# Patient Record
Sex: Female | Born: 1980 | Race: White | Hispanic: No | Marital: Married | Smoking: Former smoker
Health system: Southern US, Community
[De-identification: ages and names within clinical notes are randomized; demographics above are authoritative.]

## PROBLEM LIST (undated history)

## (undated) DIAGNOSIS — Z8041 Family history of malignant neoplasm of ovary: Secondary | ICD-10-CM

## (undated) DIAGNOSIS — R000 Tachycardia, unspecified: Secondary | ICD-10-CM

## (undated) DIAGNOSIS — N949 Unspecified condition associated with female genital organs and menstrual cycle: Secondary | ICD-10-CM

## (undated) DIAGNOSIS — I499 Cardiac arrhythmia, unspecified: Secondary | ICD-10-CM

## (undated) DIAGNOSIS — N83209 Unspecified ovarian cyst, unspecified side: Secondary | ICD-10-CM

## (undated) DIAGNOSIS — Z803 Family history of malignant neoplasm of breast: Secondary | ICD-10-CM

## (undated) DIAGNOSIS — I272 Pulmonary hypertension, unspecified: Secondary | ICD-10-CM

## (undated) DIAGNOSIS — D509 Iron deficiency anemia, unspecified: Secondary | ICD-10-CM

## (undated) DIAGNOSIS — K219 Gastro-esophageal reflux disease without esophagitis: Secondary | ICD-10-CM

## (undated) DIAGNOSIS — R0600 Dyspnea, unspecified: Secondary | ICD-10-CM

## (undated) DIAGNOSIS — I509 Heart failure, unspecified: Secondary | ICD-10-CM

## (undated) DIAGNOSIS — Z87891 Personal history of nicotine dependence: Secondary | ICD-10-CM

## (undated) DIAGNOSIS — Z9981 Dependence on supplemental oxygen: Secondary | ICD-10-CM

## (undated) DIAGNOSIS — F32A Depression, unspecified: Secondary | ICD-10-CM

## (undated) DIAGNOSIS — D259 Leiomyoma of uterus, unspecified: Secondary | ICD-10-CM

## (undated) DIAGNOSIS — F329 Major depressive disorder, single episode, unspecified: Secondary | ICD-10-CM

## (undated) DIAGNOSIS — R001 Bradycardia, unspecified: Secondary | ICD-10-CM

## (undated) DIAGNOSIS — Z950 Presence of cardiac pacemaker: Secondary | ICD-10-CM

## (undated) DIAGNOSIS — N281 Cyst of kidney, acquired: Secondary | ICD-10-CM

## (undated) DIAGNOSIS — M1990 Unspecified osteoarthritis, unspecified site: Secondary | ICD-10-CM

## (undated) DIAGNOSIS — I10 Essential (primary) hypertension: Secondary | ICD-10-CM

## (undated) DIAGNOSIS — Z87442 Personal history of urinary calculi: Secondary | ICD-10-CM

## (undated) DIAGNOSIS — R519 Headache, unspecified: Secondary | ICD-10-CM

## (undated) DIAGNOSIS — F419 Anxiety disorder, unspecified: Secondary | ICD-10-CM

## (undated) HISTORY — DX: Bradycardia, unspecified: R00.1

## (undated) HISTORY — PX: CYSTOSCOPY: SUR368

## (undated) HISTORY — PX: INSERT / REPLACE / REMOVE PACEMAKER: SUR710

## (undated) HISTORY — PX: STENT REMOVAL: SHX6421

## (undated) HISTORY — DX: Family history of malignant neoplasm of ovary: Z80.41

## (undated) HISTORY — DX: Family history of malignant neoplasm of breast: Z80.3

## (undated) HISTORY — PX: GASTRIC BYPASS: SHX52

## (undated) HISTORY — DX: Tachycardia, unspecified: R00.0

## (undated) HISTORY — PX: ABDOMINAL HYSTERECTOMY: SHX81

## (undated) HISTORY — DX: Anxiety disorder, unspecified: F41.9

## (undated) HISTORY — PX: OTHER SURGICAL HISTORY: SHX169

---

## 2010-10-01 HISTORY — PX: LITHOTRIPSY: SUR834

## 2011-10-02 HISTORY — PX: PACEMAKER INSERTION: SHX728

## 2011-10-10 HISTORY — PX: TUBAL LIGATION: SHX77

## 2018-08-18 ENCOUNTER — Ambulatory Visit
Admission: EM | Admit: 2018-08-18 | Discharge: 2018-08-18 | Disposition: A | Discharge status: Home / Self Care | Payer: Medicare Other | Attending: Family Medicine | Admitting: Family Medicine

## 2018-08-18 ENCOUNTER — Other Ambulatory Visit: Payer: Self-pay

## 2018-08-18 DIAGNOSIS — M5432 Sciatica, left side: Secondary | ICD-10-CM | POA: Diagnosis not present

## 2018-08-18 MED ORDER — PREDNISONE 20 MG PO TABS: ORAL_TABLET | ORAL | 0 refills | Status: DC

## 2018-08-18 MED ORDER — TRAMADOL HCL 50 MG PO TABS: ORAL_TABLET | ORAL | 0 refills | Status: DC

## 2018-08-18 NOTE — ED Provider Notes (Signed)
[facility]    CSN: [id number] Arrival date & time: 08/18/18  1345     History   Chief Complaint Chief Complaint  Patient presents with  . Back Pain    HPI Rachel Wall is a 37 y.o. female.   37 yo female with a c/o low back pain radiating down into the buttock and back of her leg and across the back for the past week. Denies any fall/injuries, fevers, chills, rash.   The history is provided by the patient.    History reviewed. No pertinent past medical history.  There are no active problems to display for this patient.   Past Surgical History:  Procedure Laterality Date  . LITHOTRIPSY  2012  . PACEMAKER INSERTION  2013  . STENT REMOVAL    . TUBAL LIGATION  10/10/2011    OB History   None      Home Medications    Prior to Admission medications   Medication Sig Start Date End Date Taking? Authorizing Provider  ambrisentan (LETAIRIS) 10 MG tablet Take by mouth daily.   Yes [provider]  citalopram (CELEXA) 40 MG tablet Take 40 mg by mouth daily.   Yes [provider]  diltiazem (CARDIZEM) 120 MG tablet Take 120 mg by mouth 2 (two) times daily.   Yes [provider]  ranitidine (ZANTAC) 150 MG capsule Take 150 mg by mouth 2 (two) times daily.   Yes [provider]  Selexipag (UPTRAVI) 1200 MCG TABS Take by mouth.   Yes [provider]  tadalafil (ADCIRCA/CIALIS) 20 MG tablet Take 20 mg by mouth daily as needed.   Yes [provider]  predniSONE (DELTASONE) 20 MG tablet 3 tabs po once day 1, then 2 tabs po qd x 2 days, then 1 tab po qd x 2 days, then half a tab po qd x 2 days 08/18/18   Norval Gable, MD  traMADol Veatrice Bourbon) 50 MG tablet 1-2 tabs po q 8 hours prn 08/18/18   Norval Gable, MD    Family History Family History  Problem Relation Age of Onset  . Lung disease Father   . Heart disease Father   . Diabetes Father   . Hypertension Father   . Kidney cancer Father      Social History Social History   Tobacco Use  . Smoking status: Current Every Day Smoker    Packs/day: 1.00    Types: Cigarettes  . Smokeless tobacco: Never Used  Substance Use Topics  . Alcohol use: Not Currently  . Drug use: Not Currently     Allergies   Patient has no known allergies.   Review of Systems Review of Systems   Physical Exam Triage Vital Signs ED Triage Vitals  Enc Vitals Group     BP 08/18/18 1403 (!) 108/56     Pulse Rate 08/18/18 1403 77     Resp 08/18/18 1403 18     Temp 08/18/18 1403 98.8 F (37.1 C)     Temp Source 08/18/18 1403 Oral     SpO2 08/18/18 1403 98 %     Weight 08/18/18 1356 190 lb (86.2 kg)     Height 08/18/18 1356 5\' 9"  (1.753 m)     Head Circumference --      Peak Flow --      Pain Score 08/18/18 1356 10     Pain Loc --      Pain Edu? --      Excl. in  GC? --    No data found.  Updated Vital Signs BP (!) 108/56 (BP Location: Left Arm)   Pulse 77   Temp 98.8 F (37.1 C) (Oral)   Resp 18   Ht 5\' 9"  (1.753 m)   Wt 86.2 kg   LMP 08/17/2018   SpO2 98%   BMI 28.06 kg/m   Visual Acuity Right Eye Distance:   Left Eye Distance:   Bilateral Distance:    Right Eye Near:   Left Eye Near:    Bilateral Near:     Physical Exam  Constitutional: She appears well-developed and well-nourished. No distress.  Musculoskeletal:       Lumbar back: She exhibits tenderness (over the left buttock and paraspinous muscles) and spasm. She exhibits normal range of motion, no bony tenderness, no swelling, no edema, no deformity, no laceration, no pain and normal pulse.  Skin: She is not diaphoretic.  Nursing note and vitals reviewed.    UC Treatments / Results  Labs (all labs ordered are listed, but only abnormal results are displayed) Labs Reviewed - No data to display  EKG None  Radiology No results found.  Procedures Procedures (including critical care time)  Medications Ordered in UC Medications - No data to  display  Initial Impression / Assessment and Plan / UC Course  I have reviewed the triage vital signs and the nursing notes.  Pertinent labs & imaging results that were available during my care of the patient were reviewed by me and considered in my medical decision making (see chart for details).      Final Clinical Impressions(s) / UC Diagnoses   Final diagnoses:  Sciatica of left side     Discharge Instructions     Heat/ice to area Tylenol as needed    ED Prescriptions    Medication Sig Dispense Auth. Provider   predniSONE (DELTASONE) 20 MG tablet 3 tabs po once day 1, then 2 tabs po qd x 2 days, then 1 tab po qd x 2 days, then half a tab po qd x 2 days 10 tablet Rachel Wall, Linward Foster, MD   traMADol (ULTRAM) 50 MG tablet 1-2 tabs po q 8 hours prn 15 tablet Rachel Wall, Linward Foster, MD      1. diagnosis reviewed with patient 2. rx as per orders above; reviewed possible side effects, interactions, risks and benefits  3. Recommend supportive treatment as above  4. Follow-up prn if symptoms worsen or don't improve  Controlled Substance Prescriptions [place] Controlled Substance Registry consulted? Not Applicable   Norval Gable, MD 08/18/18 [phone]

## 2018-08-18 NOTE — ED Triage Notes (Signed)
Patient complains of low back pain that radiates down her left leg and across back x 1 week.

## 2018-08-18 NOTE — Discharge Instructions (Signed)
Heat/ice to area Tylenol as needed

## 2018-10-06 ENCOUNTER — Ambulatory Visit
Admission: EM | Admit: 2018-10-06 | Discharge: 2018-10-06 | Disposition: A | Discharge status: Home / Self Care | Payer: Medicaid Other | Attending: Family Medicine | Admitting: Family Medicine

## 2018-10-06 ENCOUNTER — Encounter: Payer: Self-pay | Admitting: Emergency Medicine

## 2018-10-06 ENCOUNTER — Other Ambulatory Visit: Payer: Self-pay

## 2018-10-06 DIAGNOSIS — J019 Acute sinusitis, unspecified: Secondary | ICD-10-CM | POA: Diagnosis not present

## 2018-10-06 DIAGNOSIS — H66003 Acute suppurative otitis media without spontaneous rupture of ear drum, bilateral: Secondary | ICD-10-CM | POA: Insufficient documentation

## 2018-10-06 DIAGNOSIS — F17210 Nicotine dependence, cigarettes, uncomplicated: Secondary | ICD-10-CM

## 2018-10-06 HISTORY — DX: Depression, unspecified: F32.A

## 2018-10-06 HISTORY — DX: Major depressive disorder, single episode, unspecified: F32.9

## 2018-10-06 HISTORY — DX: Pulmonary hypertension, unspecified: I27.20

## 2018-10-06 HISTORY — DX: Gastro-esophageal reflux disease without esophagitis: K21.9

## 2018-10-06 MED ORDER — AMOXICILLIN-POT CLAVULANATE 875-125 MG PO TABS: 1.0000 | ORAL_TABLET | Freq: Two times a day (BID) | ORAL | 0 refills | Status: DC

## 2018-10-06 MED ORDER — SALINE SPRAY 0.65 % NA SOLN: 1.0000 | NASAL | 0 refills | Status: DC | PRN

## 2018-10-06 MED ORDER — GUAIFENESIN-DM 100-10 MG/5ML PO SYRP: 5.0000 mL | ORAL_SOLUTION | ORAL | 0 refills | Status: DC | PRN

## 2018-10-06 NOTE — ED Triage Notes (Signed)
Patient in today c/o productive cough (green), sinus congestion and bilateral ear fullness and body aches. Patient denies fever. Patient has tried OTC Nyquil/Dayquil, Sudafed PE.

## 2018-10-06 NOTE — Discharge Instructions (Addendum)
-  Augmentin: 1 tablet every 12 hours for 7 days -Robitussin-DM: Every 5 hours as needed for cough -Can use Ocean spray/nasal saline/Nettie pot to help flush out sinus mucus and help clear drainage -Over-the-counter ibuprofen or Tylenol as needed for pain and fever -Fluids -Follow-up as

## 2018-10-06 NOTE — ED Provider Notes (Signed)
[facility]    CSN: [id number] Arrival date & time: 10/06/18  1817     History   Chief Complaint Chief Complaint  Patient presents with  . Cough  . Nasal Congestion    HPI Rachel Wall is a 38 y.o. female.   Patient is a 38 year old female with past medical history of depression, GERD, and pulmonary hypertension reports a 61-month history of chest and sinus issues along with body aches, stuffiness rhinorrhea.  Patient also reports her eyes been puffy and she has bilateral ear pain that started yesterday.  Patient reports history of pulmonary hypertension.  Patient states again this is been ongoing for 2 months but states she could not take anymore the ear pain was making things worse.  She has tried NyQuil, Sudafed, DayQuil over-the-counter for symptoms without much relief.  Patient was seen a few months ago here at this clinic for sciatica and given steroid taper.  Patient does not complain of any sciatic symptoms at this point.     Past Medical History:  Diagnosis Date  . Depression   . GERD (gastroesophageal reflux disease)   . Pulmonary hypertension ([place])     There are no active problems to display for this patient.   Past Surgical History:  Procedure Laterality Date  . LITHOTRIPSY  2012  . PACEMAKER INSERTION  2013  . STENT REMOVAL    . TUBAL LIGATION  10/10/2011    OB History   No obstetric history on file.      Home Medications    Prior to Admission medications   Medication Sig Start Date End Date Taking? Authorizing Provider  ambrisentan (LETAIRIS) 10 MG tablet Take by mouth daily.   Yes [provider]  citalopram (CELEXA) 40 MG tablet Take 40 mg by mouth daily.   Yes [provider]  diltiazem (CARDIZEM) 120 MG tablet Take 120 mg by mouth 2 (two) times daily.   Yes [provider]  ranitidine (ZANTAC) 150 MG capsule Take 150 mg by mouth 2 (two) times daily.   Yes [provider]  Selexipag  (UPTRAVI) 1200 MCG TABS Take by mouth.   Yes [provider]  tadalafil (ADCIRCA/CIALIS) 20 MG tablet Take 20 mg by mouth daily as needed.   Yes [provider]  amoxicillin-clavulanate (AUGMENTIN) 875-125 MG tablet Take 1 tablet by mouth every 12 (twelve) hours. 10/06/18   Luvenia Redden, PA-C  guaiFENesin-dextromethorphan (ROBITUSSIN DM) 100-10 MG/5ML syrup Take 5 mLs by mouth every 4 (four) hours as needed for cough. 10/06/18   Luvenia Redden, PA-C  sodium chloride (OCEAN) 0.65 % SOLN nasal spray Place 1 spray into both nostrils as needed for congestion. 10/06/18   Luvenia Redden, PA-C    Family History Family History  Problem Relation Age of Onset  . Healthy Mother   . Lung disease Father   . Heart disease Father   . Diabetes Father   . Hypertension Father   . Kidney cancer Father     Social History Social History   Tobacco Use  . Smoking status: Current Every Day Smoker    Packs/day: 1.00    Types: Cigarettes  . Smokeless tobacco: Never Used  Substance Use Topics  . Alcohol use: Not Currently  . Drug use: Not Currently     Allergies   Other   Review of Systems Review of Systems as noted above in HPI.  Other systems reviewed and found to be negative.   Physical Exam  Triage Vital Signs ED Triage Vitals  Enc Vitals Group     BP 10/06/18 1833 133/74     Pulse Rate 10/06/18 1833 81     Resp 10/06/18 1833 (!) 24     Temp 10/06/18 1833 98.1 F (36.7 C)     Temp Source 10/06/18 1833 Oral     SpO2 10/06/18 1833 98 %     Weight 10/06/18 1835 190 lb (86.2 kg)     Height 10/06/18 1835 5\' 8"  (1.727 m)     Head Circumference --      Peak Flow --      Pain Score 10/06/18 1834 8     Pain Loc --      Pain Edu? --      Excl. in [place]? --    No data found.  Updated Vital Signs BP 133/74 (BP Location: Left Arm)   Pulse 81   Temp 98.1 F (36.7 C) (Oral)   Resp (!) 24   Ht 5\' 8"  (1.727 m)   Wt 190 lb (86.2 kg)   LMP 10/03/2018 (Exact Date)    SpO2 98%   BMI 28.89 kg/m    Physical Exam Constitutional:      Appearance: She is ill-appearing.     Comments: Appears older than stated age  HENT:     Right Ear: A middle ear effusion is present.     Left Ear: A middle ear effusion is present.     Ears:     Comments: Both ears tender to touch.    Nose:     Right Sinus: Maxillary sinus tenderness and frontal sinus tenderness present.     Left Sinus: Maxillary sinus tenderness and frontal sinus tenderness present.  Eyes:     Extraocular Movements: Extraocular movements intact.     Pupils: Pupils are equal, round, and reactive to light.  Neck:     Musculoskeletal: Normal range of motion and neck supple.  Cardiovascular:     Rate and Rhythm: Normal rate and regular rhythm.  Pulmonary:     Effort: Pulmonary effort is normal.     Breath sounds: Normal breath sounds. No wheezing, rhonchi or rales.     Comments: Frequent cough Lymphadenopathy:     Cervical: No cervical adenopathy.  Skin:    General: Skin is warm and dry.     Capillary Refill: Capillary refill takes less than 2 seconds.  Neurological:     General: No focal deficit present.     Mental Status: She is alert and oriented to person, place, and time.     Cranial Nerves: No cranial nerve deficit.  Psychiatric:        Mood and Affect: Mood normal.        Behavior: Behavior normal.      UC Treatments / Results  Labs (all labs ordered are listed, but only abnormal results are displayed) Labs Reviewed - No data to display  EKG None  Radiology No results found.  Procedures Procedures (including critical care time)  Medications Ordered in UC Medications - No data to display  Initial Impression / Assessment and Plan / UC Course  I have reviewed the triage vital signs and the nursing notes.  Pertinent labs & imaging results that were available during my care of the patient were reviewed by me and considered in my medical decision making (see chart for  details).    Patient with sinus tenderness as well as ear pain with fluid both ears with  ongoing symptoms of upper respiratory issues for about 2 months.  Worsening the last couple days.  Patient given prescription for Augmentin to cover the sinus ears, and chest.  Also give prescription Robitussin-DM for her cough.  Advised can use saline spray or Nettie pot to help open up the sinuses.  Recommend ibuprofen or Tylenol for pain.  Also recommend pushing fluids.  Instructions as below.  Final Clinical Impressions(s) / UC Diagnoses   Final diagnoses:  Acute sinusitis, recurrence not specified, unspecified location  Acute suppurative otitis media of both ears without spontaneous rupture of tympanic membranes, recurrence not specified     Discharge Instructions     -Augmentin: 1 tablet every 12 hours for 7 days -Robitussin-DM: Every 5 hours as needed for cough -Can use Ocean spray/nasal saline/Nettie pot to help flush out sinus mucus and help clear drainage -Over-the-counter ibuprofen or Tylenol as needed for pain and fever -Fluids -Follow-up as    ED Prescriptions    Medication Sig Dispense Auth. Provider   amoxicillin-clavulanate (AUGMENTIN) 875-125 MG tablet Take 1 tablet by mouth every 12 (twelve) hours. 14 tablet Luvenia Redden, PA-C   sodium chloride (OCEAN) 0.65 % SOLN nasal spray Place 1 spray into both nostrils as needed for congestion. 88 mL Luvenia Redden, PA-C   guaiFENesin-dextromethorphan (ROBITUSSIN DM) 100-10 MG/5ML syrup Take 5 mLs by mouth every 4 (four) hours as needed for cough. 118 mL Luvenia Redden, PA-C     Controlled Substance Prescriptions [place] Controlled Substance Registry consulted? Not Applicable   Luvenia Redden, PA-C 10/06/18 2105

## 2018-11-20 ENCOUNTER — Encounter: Payer: Self-pay | Admitting: Obstetrics and Gynecology

## 2018-11-20 ENCOUNTER — Ambulatory Visit (INDEPENDENT_AMBULATORY_CARE_PROVIDER_SITE_OTHER): Payer: Medicare Other | Admitting: Obstetrics and Gynecology

## 2018-11-20 ENCOUNTER — Other Ambulatory Visit (HOSPITAL_COMMUNITY)
Admission: RE | Admit: 2018-11-20 | Discharge: 2018-11-20 | Disposition: A | Discharge status: Home / Self Care | Payer: Medicare (Managed Care) | Source: Ambulatory Visit | Attending: Obstetrics and Gynecology | Admitting: Obstetrics and Gynecology

## 2018-11-20 VITALS — BP 120/72 | HR 87 | Ht 68.0 in | Wt 202.0 lb

## 2018-11-20 DIAGNOSIS — Z30432 Encounter for removal of intrauterine contraceptive device: Secondary | ICD-10-CM | POA: Diagnosis not present

## 2018-11-20 DIAGNOSIS — N921 Excessive and frequent menstruation with irregular cycle: Secondary | ICD-10-CM | POA: Diagnosis not present

## 2018-11-20 DIAGNOSIS — N760 Acute vaginitis: Secondary | ICD-10-CM | POA: Diagnosis not present

## 2018-11-20 DIAGNOSIS — Z1151 Encounter for screening for human papillomavirus (HPV): Secondary | ICD-10-CM

## 2018-11-20 DIAGNOSIS — Z720 Tobacco use: Secondary | ICD-10-CM

## 2018-11-20 DIAGNOSIS — Z124 Encounter for screening for malignant neoplasm of cervix: Secondary | ICD-10-CM | POA: Diagnosis not present

## 2018-11-20 DIAGNOSIS — R102 Pelvic and perineal pain unspecified side: Secondary | ICD-10-CM

## 2018-11-20 DIAGNOSIS — B9689 Other specified bacterial agents as the cause of diseases classified elsewhere: Secondary | ICD-10-CM

## 2018-11-20 LAB — POCT WET PREP WITH KOH
Clue Cells Wet Prep HPF POC: POSITIVE
KOH Prep POC: POSITIVE — AB
Trichomonas, UA: NEGATIVE
Yeast Wet Prep HPF POC: NEGATIVE

## 2018-11-20 LAB — HM PAP SMEAR: HM Pap smear: NORMAL

## 2018-11-20 LAB — POCT HEMOGLOBIN: Hemoglobin: 15.5 g/dL — AB (ref 11–14.6)

## 2018-11-20 MED ORDER — METRONIDAZOLE 500 MG PO TABS: 500.0000 mg | ORAL_TABLET | Freq: Two times a day (BID) | ORAL | 0 refills | Status: AC

## 2018-11-20 NOTE — Progress Notes (Signed)
Patient, No Pcp Per   Chief Complaint  Patient presents with  . Metrorrhagia    has mirena for almost 7 yrs, has been having on/off bleeding, has really bad cramping    HPI:      Ms. Rachel Wall is a 38 y.o. No obstetric history on file. who LMP was No LMP recorded. (Menstrual status: IUD)., presents today for NP eval of bleeding/cramping for the past yr. Pt has had light to heavy bleeding most days for the past yr. Also has severe cramping pains, no meds taken. Pt had Mirena placed 7 yrs ago and due to be removed. Moved her from [facility]. Pt states they wouldn't take IUD out and wanted her to see speicalist, but she couldn't afford it.  Pt with hx of menorrhagia after TL after last pregnancy. IUD placed for bleeding but didn't work. Pt still had monthly menses after IUD first placed, a little less heavy than usual, but still about 10-12 days long. Menses prior to IUD was 13 days and "super heavy".  Pt was ready to have hyst in [place] but then moved to [place] and they "woudln't do anything".  Pt did do cont dosing OCPs in past for bleeding with sx control, but pt is smoker. No hx of HTN, DVTs, migraines.  Pt also has a horrible vag odor, like "death", without increased d/c/irritation. Pt for many months. Pt is sex active with husband, no new partners. Has pelvic pain for about 30-45 min after sex. Pt due for pap. Last one ~3 yrs ago. Had an abn pap with bx ~2002 without tx.  No FH breast/ovar/uterine cancer.    Past Medical History:  Diagnosis Date  . 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 Depression   . GERD (gastroesophageal reflux disease)   . Pulmonary hypertension ([place])   . Tachycardia     Past Surgical History:  Procedure Laterality Date  . LITHOTRIPSY  2012  . PACEMAKER INSERTION  2013  . STENT REMOVAL    . TUBAL LIGATION  10/10/2011    Family History  Problem Relation Age of Onset  . Healthy Mother   . Lung disease Father   . Heart disease Father   . Diabetes Father   .  Hypertension Father   . Kidney cancer Father   . Colon cancer Paternal Grandfather     Social History   Socioeconomic History  . Marital status: Single    Spouse name: Not on file  . Number of children: Not on file  . Years of education: Not on file  . Highest education level: Not on file  Occupational History  . Not on file  Social Needs  . Financial resource strain: Not on file  . Food insecurity:    Worry: Not on file    Inability: Not on file  . Transportation needs:    Medical: Not on file    Non-medical: Not on file  Tobacco Use  . Smoking status: Current Every Day Smoker    Packs/day: 1.00    Types: Cigarettes  . Smokeless tobacco: Never Used  Substance and Sexual Activity  . Alcohol use: Not Currently  . Drug use: Not Currently  . Sexual activity: Yes    Birth control/protection: I.U.D., Surgical    Comment: Tubal ligation, Mirena  Lifestyle  . Physical activity:    Days per week: Not on file    Minutes per session: Not on file  . Stress: Not on file  Relationships  . Social connections:    Talks on phone: Not on file    Gets together: Not on file    Attends religious service: Not on file    Active member of club or organization: Not on file    Attends meetings of clubs or organizations: Not on file    Relationship status: Not on file  . Intimate partner violence:    Fear of current or ex partner: Not on file    Emotionally abused: Not on file    Physically abused: Not on file    Forced sexual activity: Not on file  Other Topics Concern  . Not on file  Social History Narrative  . Not on file    Outpatient Medications Prior to Visit  Medication Sig Dispense Refill  . albuterol (PROAIR HFA) 108 (90 Base) MCG/ACT inhaler USE 2 PUFFS BY MOUTH EVERY  6 HOURS AS NEEDED FOR  WHEEZING OR SHORTNESS OF  BREATH    . ambrisentan (LETAIRIS) 10 MG tablet Take by mouth daily.    . citalopram (CELEXA) 40 MG tablet Take 40 mg by mouth daily.    Marland Kitchen diltiazem  (CARDIZEM) 120 MG tablet Take 120 mg by mouth 2 (two) times daily.    . ranitidine (ZANTAC) 150 MG capsule Take 150 mg by mouth 2 (two) times daily.    . Selexipag (UPTRAVI) 1200 MCG TABS Take by mouth.    . tadalafil (ADCIRCA/CIALIS) 20 MG tablet Take 20 mg by mouth daily as needed.    Marland Kitchen levonorgestrel (MIRENA) 20 MCG/24HR IUD 1 each by Intrauterine route once.    Marland Kitchen amoxicillin-clavulanate (AUGMENTIN) 875-125 MG tablet Take 1 tablet by mouth every 12 (twelve) hours. 14 tablet 0  . guaiFENesin-dextromethorphan (ROBITUSSIN DM) 100-10 MG/5ML syrup Take 5 mLs by mouth every 4 (four) hours as needed for cough. 118 mL 0  . sodium chloride (OCEAN) 0.65 % SOLN nasal spray Place 1 spray into both nostrils as needed for congestion. 88 mL 0   No facility-administered medications prior to visit.       ROS:  Review of Systems  Constitutional: Positive for fatigue. Negative for fever and unexpected weight change.  Respiratory: Negative for cough, shortness of breath and wheezing.   Cardiovascular: Negative for chest pain, palpitations and leg swelling.  Gastrointestinal: Negative for blood in stool, constipation, diarrhea, nausea and vomiting.  Endocrine: Negative for cold intolerance, heat intolerance and polyuria.  Genitourinary: Positive for dyspareunia, menstrual problem, pelvic pain and vaginal discharge. Negative for dysuria, flank pain, frequency, genital sores, hematuria, urgency, vaginal bleeding and vaginal pain.  Musculoskeletal: Negative for back pain, joint swelling and myalgias.  Skin: Negative for rash.  Neurological: Negative for dizziness, syncope, light-headedness, numbness and headaches.  Hematological: Negative for adenopathy.  Psychiatric/Behavioral: Positive for agitation and dysphoric mood. Negative for confusion, sleep disturbance and suicidal ideas. The patient is not nervous/anxious.    BREAST: No symptoms   OBJECTIVE:   Vitals:  BP 120/72   Pulse 87   Ht 5\' 8"   (1.727 m)   Wt 202 lb (91.6 kg)   BMI 30.71 kg/m   Physical Exam Vitals signs reviewed.  Constitutional:      Appearance: She is well-developed.  Neck:     Musculoskeletal: Normal range of motion.  Pulmonary:     Effort: Pulmonary effort is normal.  Abdominal:     General: Abdomen is flat.     Palpations: Abdomen is soft.  Genitourinary:    General: Normal vulva.  Pubic Area: No rash.      Labia:        Right: No rash, tenderness or lesion.        Left: No rash, tenderness or lesion.      Vagina: Normal. No vaginal discharge, erythema or tenderness.     Cervix: Cervical bleeding present.     Uterus: Normal. Not enlarged and not tender.      Adnexa: Right adnexa normal and left adnexa normal.       Right: No mass or tenderness.         Left: No mass or tenderness.       Comments: IUD STRINGS IN CX OS Musculoskeletal: Normal range of motion.  Skin:    General: Skin is warm and dry.  Neurological:     General: No focal deficit present.     Mental Status: She is alert and oriented to person, place, and time.     Cranial Nerves: No cranial nerve deficit.  Psychiatric:        Mood and Affect: Mood normal.        Behavior: Behavior normal.        Thought Content: Thought content normal.        Judgment: Judgment normal.     Results: Results for orders placed or performed in visit on 11/20/18 (from the past 24 hour(s))  POCT Wet Prep with KOH     Status: Abnormal   Collection Time: 11/20/18  4:33 PM  Result Value Ref Range   Trichomonas, UA Negative    Clue Cells Wet Prep HPF POC pos    Epithelial Wet Prep HPF POC     Yeast Wet Prep HPF POC neg    Bacteria Wet Prep HPF POC     RBC Wet Prep HPF POC     WBC Wet Prep HPF POC     KOH Prep POC Positive (A) Negative  POCT hemoglobin     Status: Abnormal   Collection Time: 11/20/18  4:33 PM  Result Value Ref Range   Hemoglobin 15.5 (A) 11 - 14.6 g/dL    IUD Removal Strings of IUD identified and grasped.  IUD  removed without problem with ring forceps.  Pt tolerated this well.  IUD noted to be intact.  Assessment/Plan: Menorrhagia with irregular cycle - WNL HgB. Could be related to expired/possibly malpositioned IUD. Removed today. RTO in 3 wks for GYN u/s. If sx persist, will refer to MD. - Plan: US PELVIS TRANSVANGINAL NON-OB (TV ONLY), POCT hemoglobin  Pelvic pain - See if sx improve after IUD removal. Check u/s in 3 wks. Pt interested in hyst.   Encounter for removal of intrauterine contraceptive device (IUD)  Bacterial vaginosis - Pos sx/wet prep. Rx flagyl. No EtOH. Will RF if sx recur. F/u prn.  - Plan: POCT Wet Prep with KOH, metroNIDAZOLE (FLAGYL) 500 MG tablet  Cervical cancer screening - Plan: Cytology - PAP  Screening for HPV (human papillomavirus) - Plan: Cytology - PAP  Tobacco use - Can't have estrogen BC.    Meds ordered this encounter  Medications  . metroNIDAZOLE (FLAGYL) 500 MG tablet    Sig: Take 1 tablet (500 mg total) by mouth 2 (two) times daily for 7 days.    Dispense:  14 tablet    Refill:  0    Order Specific Question:   Supervising Provider    Answer:   Gae Dry [[id number]]      Return in about 3  weeks (around 12/11/2018) for GYN u/s for menorrhagia--[facility] to call pt.  Chai Routh B. Chanson Teems, PA-C 11/20/2018 4:37 PM

## 2018-11-20 NOTE — Patient Instructions (Signed)
I value your feedback and entrusting us with your care. If you get a [place] patient survey, I would appreciate you taking the time to let us know about your experience today. Thank you! 

## 2018-11-25 LAB — CYTOLOGY - PAP
Diagnosis: NEGATIVE
HPV: NOT DETECTED

## 2018-11-26 ENCOUNTER — Other Ambulatory Visit: Payer: Medicare Other

## 2018-12-11 ENCOUNTER — Ambulatory Visit (INDEPENDENT_AMBULATORY_CARE_PROVIDER_SITE_OTHER): Payer: Medicare Other

## 2018-12-11 ENCOUNTER — Other Ambulatory Visit: Payer: Self-pay

## 2018-12-11 DIAGNOSIS — N921 Excessive and frequent menstruation with irregular cycle: Secondary | ICD-10-CM

## 2018-12-11 DIAGNOSIS — N854 Malposition of uterus: Secondary | ICD-10-CM | POA: Diagnosis not present

## 2018-12-11 DIAGNOSIS — D251 Intramural leiomyoma of uterus: Secondary | ICD-10-CM

## 2018-12-15 ENCOUNTER — Telehealth: Payer: Self-pay | Admitting: Obstetrics and Gynecology

## 2018-12-15 NOTE — Telephone Encounter (Signed)
Pt aware of neg GYN u/s results except small leio. Pt with menometrorrhagia, not improved with IUD, can no longer have estrogen due to tob use. Pt ultimately wants hyst but also interested in endometrial ablation. Will RTO with MD for mgmt discussion.   Patient Name: Rachel Wall DOB: Feb 12, 1981 MRN: [id number]  Indications:Enlarged Uterus Findings:  The uterus is anteverted and measures 11.16x7.48x6.43 cm. Echo texture is homogenous with evidence of focal masses. Within the uterus are multiple suspected fibroids measuring: Fibroid 1:3.97x3.48x4.14 cm Posterior Body Intramural but contacts the endometrium.  The Endometrium measures 4.62 mm.  Right Ovary measures 3.35x2.60x2.26 cm. It is normal in appearance. Left Ovary measures 3.12x3.85x2.49 cm. It is normal in appearance. Survey of the adnexa demonstrates no adnexal masses. There is no free fluid in the cul de sac.  Impression: Anteverted uterus with a single fibroid seen =1:3.97x3.48x4.14 cm located posterior body that contacts the endometrium.  Recommendations: 1.Clinical correlation with the patient's History and Physical Exam.  Mital bahen Marlowe Sax RDMS

## 2018-12-19 ENCOUNTER — Encounter: Payer: Self-pay | Admitting: Obstetrics and Gynecology

## 2018-12-19 ENCOUNTER — Other Ambulatory Visit: Payer: Self-pay

## 2018-12-19 ENCOUNTER — Ambulatory Visit (INDEPENDENT_AMBULATORY_CARE_PROVIDER_SITE_OTHER): Payer: Medicare Other | Admitting: Obstetrics and Gynecology

## 2018-12-19 DIAGNOSIS — N921 Excessive and frequent menstruation with irregular cycle: Secondary | ICD-10-CM | POA: Diagnosis not present

## 2018-12-19 NOTE — Patient Instructions (Signed)
Question to be answered: Norethindrone acetate (5 mg ) and small risk of fluid retention.  How might this affect pulmonary hypertension?

## 2018-12-19 NOTE — Progress Notes (Signed)
Obstetrics & Gynecology Office Visit   Chief Complaint  Patient presents with  . Consult  menorrhagia  History of Present Illness: 38 y.o. [id number] female who has a long history of heavy, painful periods.  She had an IUD for seven years that was removed just 3 weeks ago. Since that time she had one period that was "aweful" for her.  See [name]'s note from three weeks ago.  She also had a pelvic ultrasound that showed an intramural, posterior fibroid that measured about 4 cm in each direction.  She has never taken any form of pills.  She bled frequently with her IUD. She had a lot of bleeding for the past year with her IUD, that had been in for 7 years.  She has tried every form of birth control and nothing worked.   Past Medical History:  Diagnosis Date  . 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 Depression   . GERD (gastroesophageal reflux disease)   . Pulmonary hypertension ([place])   . Tachycardia    Past Surgical History:  Procedure Laterality Date  . LITHOTRIPSY  2012  . PACEMAKER INSERTION  2013  . STENT REMOVAL    . TUBAL LIGATION  10/10/2011    Gynecologic History: No LMP recorded. (Menstrual status: IUD).  Obstetric History: [id number]  Family History  Problem Relation Age of Onset  . Healthy Mother   . Lung disease Father   . Heart disease Father   . Diabetes Father   . Hypertension Father   . Kidney cancer Father   . Colon cancer Paternal Grandfather     Social History   Socioeconomic History  . Marital status: Single    Spouse name: Not on file  . Number of children: Not on file  . Years of education: Not on file  . Highest education level: Not on file  Occupational History  . Not on file  Social Needs  . Financial resource strain: Not on file  . Food insecurity:    Worry: Not on file    Inability: Not on file  . Transportation needs:    Medical: Not on file    Non-medical: Not on file  Tobacco Use  . Smoking status: Current Every Day Smoker   Packs/day: 1.00    Types: Cigarettes  . Smokeless tobacco: Never Used  Substance and Sexual Activity  . Alcohol use: Not Currently  . Drug use: Not Currently  . Sexual activity: Yes    Birth control/protection: I.U.D., Surgical    Comment: Tubal ligation, Mirena  Lifestyle  . Physical activity:    Days per week: Not on file    Minutes per session: Not on file  . Stress: Not on file  Relationships  . Social connections:    Talks on phone: Not on file    Gets together: Not on file    Attends religious service: Not on file    Active member of club or organization: Not on file    Attends meetings of clubs or organizations: Not on file    Relationship status: Not on file  . Intimate partner violence:    Fear of current or ex partner: Not on file    Emotionally abused: Not on file    Physically abused: Not on file    Forced sexual activity: Not on file  Other Topics Concern  . Not on file  Social History Narrative  . Not on file    Allergies  Allergen Reactions  .  Other     Muscle relaxers - per patient interacts with other medications she is on.    Prior to Admission medications   Medication Sig Start Date End Date Taking? Authorizing Provider  albuterol (PROAIR HFA) 108 (90 Base) MCG/ACT inhaler USE 2 PUFFS BY MOUTH EVERY  6 HOURS AS NEEDED FOR  WHEEZING OR SHORTNESS OF  BREATH 11/10/18   [provider]  ambrisentan (LETAIRIS) 10 MG tablet Take by mouth daily.    [provider]  citalopram (CELEXA) 40 MG tablet Take 40 mg by mouth daily.    [provider]  diltiazem (CARDIZEM) 120 MG tablet Take 120 mg by mouth 2 (two) times daily.    [provider]  ranitidine (ZANTAC) 150 MG capsule Take 150 mg by mouth 2 (two) times daily.    [provider]  Selexipag (UPTRAVI) 1200 MCG TABS Take by mouth.    [provider]  tadalafil (ADCIRCA/CIALIS) 20 MG tablet Take 20 mg by mouth daily as needed.    [provider]    Review of Systems  Constitutional: Negative.   HENT: Negative.   Eyes: Negative.   Respiratory: Negative.   Cardiovascular: Negative.   Gastrointestinal: Negative.   Genitourinary: Negative.   Musculoskeletal: Negative.   Skin: Negative.   Neurological: Negative.   Psychiatric/Behavioral: Negative.      Physical Exam BP 128/88   Ht 5\' 9"  (1.753 m)   Wt 205 lb (93 kg)   BMI 30.27 kg/m  No LMP recorded. (Menstrual status: IUD). Physical Exam Constitutional:      General: She is not in acute distress.    Appearance: Normal appearance.  HENT:     Head: Normocephalic and atraumatic.  Eyes:     General: No scleral icterus.    Conjunctiva/sclera: Conjunctivae normal.  Neurological:     General: No focal deficit present.     Mental Status: She is alert and oriented to person, place, and time.     Cranial Nerves: No cranial nerve deficit.  Psychiatric:        Mood and Affect: Mood normal.        Behavior: Behavior normal.        Judgment: Judgment normal.     Female chaperone present for pelvic and breast  portions of the physical exam  Assessment: 38 y.o. [id number] female here for  1. Menorrhagia with irregular cycle      Plan: Problem List Items Addressed This Visit      Other   Menorrhagia with irregular cycle     Discussed patient's ultrasound, which I reviewed today.  Also discussed the patient's clinical history of heavy and abnormal bleeding.  She recently had her IUD removed and had a thin endometrial stripe.  She is unlikely to have endometrial hyperplasia or cancer, given her long history of IUD use (progesterone-containing).  Discussed treatment options, including; do nothing (clinical observation), medication management, surgery with either endometrial ablation or hysterectomy.  The patient prefers to have a hysterectomy, if covered by her insurance.  However, no elective surgeries are being performed at this time and this would not be approved by [facility] or  Medicare.  In the meantime, we discussed management with hormones.  She is not a good candidate for estrogen.  We discussed using a high dose of progesterone for the short-term.  Given the fluid retention potential of the high-dose progesterone, I have asked her to discuss this with her pulmonologist and cardiologist to ensure this would  not exacerbate her pulmonary hypertension.  Additionally, if she were to have to have surgery, she would need medical clearance given her medical history.  25 minutes spent in face to face discussion with > 50% spent in counseling,management, and coordination of care of her menorrhagia with irregular cycle.  Prentice Docker, MD 12/19/2018 4:52 PM

## 2019-01-22 ENCOUNTER — Ambulatory Visit
Admission: RE | Admit: 2019-01-22 | Discharge: 2019-01-22 | Disposition: A | Discharge status: Home / Self Care | Payer: Medicare (Managed Care) | Attending: Nurse Practitioner | Admitting: Nurse Practitioner

## 2019-01-22 ENCOUNTER — Ambulatory Visit
Admission: RE | Admit: 2019-01-22 | Discharge: 2019-01-22 | Disposition: A | Discharge status: Home / Self Care | Payer: Medicare (Managed Care) | Source: Ambulatory Visit | Attending: Nurse Practitioner | Admitting: Nurse Practitioner

## 2019-01-22 ENCOUNTER — Other Ambulatory Visit: Payer: Self-pay

## 2019-01-22 ENCOUNTER — Other Ambulatory Visit: Payer: Self-pay | Admitting: Nurse Practitioner

## 2019-01-22 DIAGNOSIS — M25462 Effusion, left knee: Secondary | ICD-10-CM | POA: Insufficient documentation

## 2019-02-09 ENCOUNTER — Ambulatory Visit
Admission: EM | Admit: 2019-02-09 | Discharge: 2019-02-09 | Disposition: A | Discharge status: Home / Self Care | Payer: Medicare Other | Attending: Family Medicine | Admitting: Family Medicine

## 2019-02-09 ENCOUNTER — Other Ambulatory Visit: Payer: Self-pay

## 2019-02-09 ENCOUNTER — Encounter: Payer: Self-pay | Admitting: Emergency Medicine

## 2019-02-09 DIAGNOSIS — H6501 Acute serous otitis media, right ear: Secondary | ICD-10-CM

## 2019-02-09 DIAGNOSIS — J0100 Acute maxillary sinusitis, unspecified: Secondary | ICD-10-CM | POA: Diagnosis not present

## 2019-02-09 MED ORDER — AMOXICILLIN 875 MG PO TABS: 875.0000 mg | ORAL_TABLET | Freq: Two times a day (BID) | ORAL | 0 refills | Status: DC

## 2019-02-09 NOTE — ED Triage Notes (Signed)
Pt c/o right ear pain and sinus pain/pressure. She has brown/green mucus. Started about a week ago.

## 2019-02-09 NOTE — ED Provider Notes (Signed)
[facility]    CSN: [id number] Arrival date & time: 02/09/19  1432     History   Chief Complaint Chief Complaint  Patient presents with  . Sinus Problem  . Otalgia    right    HPI Rachel Wall is a 38 y.o. female.   The history is provided by the patient.  Sinus Problem   Otalgia  Associated symptoms: congestion   URI  Presenting symptoms: congestion and ear pain   Severity:  Moderate Onset quality:  Sudden Duration:  1 week Timing:  Constant Chronicity:  New Relieved by:  Nothing Ineffective treatments:  OTC medications Associated symptoms: sinus pain   Risk factors: no recent travel and no sick contacts     Past Medical History:  Diagnosis Date  . 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 Depression   . GERD (gastroesophageal reflux disease)   . Pulmonary hypertension ([place])   . Tachycardia     Patient Active Problem List   Diagnosis Date Noted  . Menorrhagia with irregular cycle 12/19/2018  . Tobacco use 11/20/2018    Past Surgical History:  Procedure Laterality Date  . LITHOTRIPSY  2012  . PACEMAKER INSERTION  2013  . STENT REMOVAL    . TUBAL LIGATION  10/10/2011    OB History    Gravida  5   Para  3   Term  3   Preterm      AB  2   Living  3     SAB  2   TAB      Ectopic      Multiple      Live Births  3            Home Medications    Prior to Admission medications   Medication Sig Start Date End Date Taking? Authorizing Provider  albuterol (PROAIR HFA) 108 (90 Base) MCG/ACT inhaler USE 2 PUFFS BY MOUTH EVERY  6 HOURS AS NEEDED FOR  WHEEZING OR SHORTNESS OF  BREATH 11/10/18  Yes [provider]  ambrisentan (LETAIRIS) 10 MG tablet Take by mouth daily.   Yes [provider]  citalopram (CELEXA) 40 MG tablet Take 40 mg by mouth daily.   Yes [provider]  diltiazem (CARDIZEM) 120 MG tablet Take 120 mg by mouth 2 (two) times daily.   Yes [provider]  Selexipag  (UPTRAVI) 1200 MCG TABS Take by mouth.   Yes [provider]  tadalafil (ADCIRCA/CIALIS) 20 MG tablet Take 20 mg by mouth daily as needed.   Yes [provider]  amoxicillin (AMOXIL) 875 MG tablet Take 1 tablet (875 mg total) by mouth 2 (two) times daily. 02/09/19   Norval Gable, MD  ranitidine (ZANTAC) 150 MG capsule Take 150 mg by mouth 2 (two) times daily.    [provider]    Family History Family History  Problem Relation Age of Onset  . Healthy Mother   . Lung disease Father   . Heart disease Father   . Diabetes Father   . Hypertension Father   . Kidney cancer Father   . Colon cancer Paternal Grandfather     Social History Social History   Tobacco Use  . Smoking status: Current Every Day Smoker    Packs/day: 1.00    Types: Cigarettes  . Smokeless tobacco: Never Used  Substance Use Topics  . Alcohol use: Not Currently  . Drug use: Not Currently     Allergies  Other   Review of Systems Review of Systems  HENT: Positive for congestion, ear pain and sinus pain.      Physical Exam Triage Vital Signs ED Triage Vitals  Enc Vitals Group     BP 02/09/19 1448 114/67     Pulse Rate 02/09/19 1448 87     Resp 02/09/19 1448 20     Temp 02/09/19 1448 98.6 F (37 C)     Temp Source 02/09/19 1448 Oral     SpO2 02/09/19 1448 99 %     Weight 02/09/19 1444 200 lb (90.7 kg)     Height 02/09/19 1444 5\' 9"  (1.753 m)     Head Circumference --      Peak Flow --      Pain Score 02/09/19 1444 9     Pain Loc --      Pain Edu? --      Excl. in [place]? --    No data found.  Updated Vital Signs BP 114/67 (BP Location: Left Arm)   Pulse 87   Temp 98.6 F (37 C) (Oral)   Resp 20   Ht 5\' 9"  (1.753 m)   Wt 90.7 kg   SpO2 99%   BMI 29.53 kg/m   Visual Acuity Right Eye Distance:   Left Eye Distance:   Bilateral Distance:    Right Eye Near:   Left Eye Near:    Bilateral Near:     Physical Exam   UC Treatments / Results  Labs (all  labs ordered are listed, but only abnormal results are displayed) Labs Reviewed - No data to display  EKG None  Radiology No results found.  Procedures Procedures (including critical care time)  Medications Ordered in UC Medications - No data to display  Initial Impression / Assessment and Plan / UC Course  I have reviewed the triage vital signs and the nursing notes.  Pertinent labs & imaging results that were available during my care of the patient were reviewed by me and considered in my medical decision making (see chart for details).      Final Clinical Impressions(s) / UC Diagnoses   Final diagnoses:  Right acute serous otitis media, recurrence not specified  Acute maxillary sinusitis, recurrence not specified    ED Prescriptions    Medication Sig Dispense Auth. Provider   amoxicillin (AMOXIL) 875 MG tablet Take 1 tablet (875 mg total) by mouth 2 (two) times daily. 20 tablet Norval Gable, MD      1. diagnosis reviewed with patient 2. rx as per orders above; reviewed possible side effects, interactions, risks and benefits  3. Recommend supportive treatment with otc flonase  4. Follow-up prn if symptoms worsen or don't improve  Controlled Substance Prescriptions [place] Controlled Substance Registry consulted? Not Applicable   Norval Gable, MD 02/09/19 [phone]

## 2019-03-26 ENCOUNTER — Encounter: Payer: Self-pay | Admitting: Obstetrics and Gynecology

## 2019-03-26 ENCOUNTER — Other Ambulatory Visit: Payer: Self-pay

## 2019-03-26 ENCOUNTER — Other Ambulatory Visit (HOSPITAL_COMMUNITY)
Admission: RE | Admit: 2019-03-26 | Discharge: 2019-03-26 | Disposition: A | Discharge status: Home / Self Care | Payer: 59 | Source: Ambulatory Visit | Attending: Obstetrics and Gynecology | Admitting: Obstetrics and Gynecology

## 2019-03-26 ENCOUNTER — Ambulatory Visit (INDEPENDENT_AMBULATORY_CARE_PROVIDER_SITE_OTHER): Payer: 59 | Admitting: Obstetrics and Gynecology

## 2019-03-26 VITALS — BP 120/77 | Ht 68.0 in | Wt 211.0 lb

## 2019-03-26 DIAGNOSIS — N921 Excessive and frequent menstruation with irregular cycle: Secondary | ICD-10-CM

## 2019-03-26 NOTE — Progress Notes (Signed)
Obstetrics & Gynecology Office Visit   Chief Complaint:  Chief Complaint  Patient presents with  . Menorrhagia    this last period lasted until 6/24, cramping in entire pelvic area    History of Present Illness: 38 y.o. [id number] female who presents in follow up from a menorrhagia visit (see note from 11/2018). She started her menses this past month on 6/6 and it lasted until 6/24. She had to use the heaviest level pad and tampon and she would go through a box of overnight pads and tampons every other day.  She has been on no treatment since our last visit.  She has tried multiple methods of hormonal medication to control her periods, all of which have failed.  She greatly desires a hysterectomy.  Past Medical History:  Diagnosis Date  . 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 Depression   . GERD (gastroesophageal reflux disease)   . Pulmonary hypertension ([address])   . Tachycardia     Past Surgical History:  Procedure Laterality Date  . LITHOTRIPSY  2012  . PACEMAKER INSERTION  2013  . STENT REMOVAL    . TUBAL LIGATION  10/10/2011   Gynecologic History: Patient's last menstrual period was 03/07/2019 (exact date).  Obstetric History: [id number]  Family History  Problem Relation Age of Onset  . Healthy Mother   . Lung disease Father   . Heart disease Father   . Diabetes Father   . Hypertension Father   . Kidney cancer Father   . Colon cancer Paternal Grandfather     Social History   Socioeconomic History  . Marital status: Single    Spouse name: Not on file  . Number of children: Not on file  . Years of education: Not on file  . Highest education level: Not on file  Occupational History  . Not on file  Social Needs  . Financial resource strain: Not on file  . Food insecurity    Worry: Not on file    Inability: Not on file  . Transportation needs    Medical: Not on file    Non-medical: Not on file  Tobacco Use  . Smoking status: Current Every Day Smoker    Packs/day:  1.00    Types: Cigarettes  . Smokeless tobacco: Never Used  Substance and Sexual Activity  . Alcohol use: Not Currently  . Drug use: Not Currently  . Sexual activity: Yes    Birth control/protection: Surgical    Comment: Tubal ligation  Lifestyle  . Physical activity    Days per week: Not on file    Minutes per session: Not on file  . Stress: Not on file  Relationships  . Social Herbalist on phone: Not on file    Gets together: Not on file    Attends religious service: Not on file    Active member of club or organization: Not on file    Attends meetings of clubs or organizations: Not on file    Relationship status: Not on file  . Intimate partner violence    Fear of current or ex partner: Not on file    Emotionally abused: Not on file    Physically abused: Not on file    Forced sexual activity: Not on file  Other Topics Concern  . Not on file  Social History Narrative  . Not on file    Allergies  Allergen Reactions  . Other     Muscle relaxers - per  patient interacts with other medications she is on.    Prior to Admission medications   Medication Sig Start Date End Date Taking? Authorizing Provider  albuterol (PROAIR HFA) 108 (90 Base) MCG/ACT inhaler USE 2 PUFFS BY MOUTH EVERY  6 HOURS AS NEEDED FOR  WHEEZING OR SHORTNESS OF  BREATH 11/10/18  Yes [provider]  ambrisentan (LETAIRIS) 10 MG tablet Take by mouth daily.   Yes [provider]  citalopram (CELEXA) 40 MG tablet Take 40 mg by mouth daily.   Yes [provider]  diltiazem (CARDIZEM) 120 MG tablet Take 120 mg by mouth 2 (two) times daily.   Yes [provider]  Selexipag (UPTRAVI) 1200 MCG TABS Take by mouth.   Yes [provider]  tadalafil (ADCIRCA/CIALIS) 20 MG tablet Take 20 mg by mouth daily as needed.   Yes [provider]    Review of Systems  Constitutional: Negative.        Hot flashes  HENT: Negative.   Eyes: Negative.    Respiratory: Negative.   Cardiovascular: Negative.   Gastrointestinal: Negative.   Genitourinary: Negative.   Musculoskeletal: Negative.   Skin: Negative.   Neurological: Negative.   Psychiatric/Behavioral: Positive for depression. Negative for hallucinations, memory loss, substance abuse and suicidal ideas. The patient is nervous/anxious. The patient does not have insomnia.      Physical Exam BP 120/77   Ht 5\' 8"  (1.727 m)   Wt 211 lb (95.7 kg)   LMP 03/07/2019 (Exact Date)   BMI 32.08 kg/m  Patient's last menstrual period was 03/07/2019 (exact date). Physical Exam Constitutional:      General: She is not in acute distress.    Appearance: Normal appearance. She is well-developed.  Genitourinary:     Pelvic exam was performed with patient in the lithotomy position.     Vulva, inguinal canal, urethra, bladder, vagina, uterus, right adnexa and left adnexa normal.     No posterior fourchette tenderness, injury or lesion present.     No cervical friability, lesion, bleeding or polyp.  HENT:     Head: Normocephalic and atraumatic.  Eyes:     General: No scleral icterus.    Conjunctiva/sclera: Conjunctivae normal.  Neck:     Musculoskeletal: Normal range of motion and neck supple.  Cardiovascular:     Rate and Rhythm: Normal rate and regular rhythm.     Heart sounds: No murmur. No friction rub. No gallop.   Pulmonary:     Effort: Pulmonary effort is normal. No respiratory distress.     Breath sounds: Normal breath sounds. No wheezing or rales.  Abdominal:     General: Bowel sounds are normal. There is no distension.     Palpations: Abdomen is soft. There is no mass.     Tenderness: There is no abdominal tenderness. There is no guarding or rebound.  Musculoskeletal: Normal range of motion.  Neurological:     General: No focal deficit present.     Mental Status: She is alert and oriented to person, place, and time.     Cranial Nerves: No cranial nerve deficit.  Skin:     General: Skin is warm and dry.     Findings: No erythema.  Psychiatric:        Mood and Affect: Mood normal.        Behavior: Behavior normal.        Judgment: Judgment normal.    Endometrial Biopsy After discussion with the patient regarding her abnormal  uterine bleeding I recommended that she proceed with an endometrial biopsy for further diagnosis. The risks, benefits, alternatives, and indications for an endometrial biopsy were discussed with the patient in detail. She understood the risks including infection, bleeding, cervical laceration and uterine perforation.  Verbal consent was obtained.   PROCEDURE NOTE:  [place] endometrial biopsy was performed using aseptic technique with iodine preparation.  The uterus was sounded to a length of 8 cm.  Adequate sampling was obtained with minimal blood loss.  The patient tolerated the procedure well.    Female chaperone present for pelvic and breast  portions of the physical exam  Assessment: 38 y.o. [id number] female here for  1. Menorrhagia with irregular cycle      Plan: Problem List Items Addressed This Visit      Other   Menorrhagia with irregular cycle - Primary   Relevant Orders   Surgical pathology     Discussed multiple methods of treatment.  She declines all methods of hormonal methods, though she will accept norethindrone until the time of her surgery. She has been extensively counseled about the risks of major surgery with her medical conditions.  I will require surgical clearance for cardiac and pulmonary function prior to performing the surgery. With these clearances, we will proceed.  Endometrial biopsy performed today to ensure no hyperplasia or malignancy.   20 minutes spent in face to face discussion with > 50% spent in counseling,management, and coordination of care of her menorrhagia with irregular cycle.   Prentice Docker, MD 03/26/2019 3:58 PM

## 2019-03-27 ENCOUNTER — Encounter: Payer: Self-pay | Admitting: Obstetrics and Gynecology

## 2019-03-27 MED ORDER — NORETHINDRONE ACETATE 5 MG PO TABS: 5.0000 mg | ORAL_TABLET | Freq: Every day | ORAL | 11 refills | Status: DC

## 2019-03-30 ENCOUNTER — Telehealth: Payer: Self-pay | Admitting: Obstetrics and Gynecology

## 2019-03-30 NOTE — Telephone Encounter (Signed)
Lmtrc

## 2019-03-30 NOTE — Telephone Encounter (Signed)
-----   Message from Will Bonnet, MD sent at 03/27/2019 11:03 PM EDT ----- Regarding: Schedule surgery Surgery Booking Request Patient Full Name:  Rachel Wall  MRN: [id number]  DOB: Apr 19, 1981  Surgeon: Prentice Docker, MD  Requested Surgery Date and Time: TBD Primary Diagnosis AND Code: menorrhagia with irregular cycle Secondary Diagnosis and Code:  Surgical Procedure: TLH/BS L&D Notification: No Admission Status: observation Length of Surgery: 2 hours Special Case Needs: none H&P: TBD (date) Phone Interview???: no Interpreter: Language:  Medical Clearance: yes (Cardiac and Pulmonary) Special Scheduling Instructions: may schedule surgery, but patient needs pulmonary and cardiac clearance prior to having surgery Acuity: P3

## 2019-03-30 NOTE — Telephone Encounter (Signed)
Patient is aware she needs pulmonology and cardiac clearance prior to having surgery. Patient's pulmonologist is Dr Pricilla Handler @ [facility], and doesn't remember the name of the cardiologist, but they are in the same building, 1st floor.Patient is aware of H&P on 05/04/19 @ 4:10pm w/ Dr. Glennon Mac, Pre-admit testing and COVID testing to be scheduled, and OR on 05/12/19. Patient is aware she will be asked to quarantine after COVID testing. Patient is aware she may receive calls from the [facility] and [facility]. Patient confirmed [employer] and secondary Medicaid. Patient is aware of no visitors at [facility].

## 2019-04-08 NOTE — Telephone Encounter (Signed)
Patient is aware of Pre-admit Testing with COVID testing on 05/08/19 @ 9:00am.

## 2019-05-04 ENCOUNTER — Encounter: Payer: 59 | Admitting: Obstetrics and Gynecology

## 2019-05-05 ENCOUNTER — Ambulatory Visit (INDEPENDENT_AMBULATORY_CARE_PROVIDER_SITE_OTHER): Payer: 59 | Admitting: Obstetrics and Gynecology

## 2019-05-05 ENCOUNTER — Other Ambulatory Visit: Payer: Self-pay

## 2019-05-05 ENCOUNTER — Other Ambulatory Visit: Payer: 59

## 2019-05-05 VITALS — BP 126/74 | Ht 68.0 in | Wt 211.0 lb

## 2019-05-05 DIAGNOSIS — N921 Excessive and frequent menstruation with irregular cycle: Secondary | ICD-10-CM

## 2019-05-05 DIAGNOSIS — I2721 Secondary pulmonary arterial hypertension: Secondary | ICD-10-CM

## 2019-05-05 NOTE — H&P (View-Only) (Signed)
Preoperative History and Physical  Rachel Wall is a 38 y.o. [id number] here for surgical management of menorrhagia.   No significant preoperative concerns.  History of Present Illness: 38 y.o. [id number] female who has a long history of heavy, painful periods.  She had an IUD for seven years that was removed just 5 months ago. Since that time she had one period that was "aweful" for her.  See Rachel Wall's note from March weeks ago.  She also had a pelvic ultrasound that showed an intramural, posterior fibroid that measured about 4 cm in each direction.  She has never taken any form of pills.  She bled frequently with her IUD. She had a lot of bleeding for the past year with her IUD, that had been in for 7 years.  She has tried every form of birth control and nothing worked. She greatly desires a hysterectomy and has been cleared by her cardiologist and pulmonologist with some specific management recommendations.    Proposed surgery: Total laparoscopic hysterectomy, bilateral salpingectomy, cystoscopy  Past Medical History:  Diagnosis Date  . 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 Depression   . GERD (gastroesophageal reflux disease)   . Pulmonary hypertension ([place])   . Tachycardia    Past Surgical History:  Procedure Laterality Date  . LITHOTRIPSY  2012  . PACEMAKER INSERTION  2013  . STENT REMOVAL    . TUBAL LIGATION  10/10/2011   OB History  Gravida Para Term Preterm AB Living  5 3 3   2 3   SAB TAB Ectopic Multiple Live Births  2       3    # Outcome Date GA Lbr Len/2nd Weight Sex Delivery Anes PTL Lv  5 SAB           4 SAB           3 Term           2 Term           1 Term           Patient denies any other pertinent gynecologic issues.   Current Outpatient Medications on File Prior to Visit  Medication Sig Dispense Refill  . albuterol (PROAIR HFA) 108 (90 Base) MCG/ACT inhaler Inhale 2 puffs into the lungs every 6 (six) hours as needed for wheezing or shortness of breath.      Marland Kitchen ambrisentan (LETAIRIS) 10 MG tablet Take 10 mg by mouth daily.     . citalopram (CELEXA) 40 MG tablet Take 40 mg by mouth daily.    Marland Kitchen diltiazem (CARDIZEM) 120 MG tablet Take 120-240 mg by mouth See admin instructions. Take 120 mg by mouth in the morning and take 240 mg by mouth at bedtime    . famotidine (PEPCID AC MAXIMUM STRENGTH) 20 MG tablet Take 40 mg by mouth 2 (two) times daily.    . norethindrone (AYGESTIN) 5 MG tablet Take 1 tablet (5 mg total) by mouth daily. (Patient not taking: Reported on 05/05/2019) 30 tablet 11  . Selexipag (UPTRAVI) 1200 MCG TABS Take 1,200 mcg by mouth 2 (two) times daily.     . tadalafil (ADCIRCA/CIALIS) 20 MG tablet Take 40 mg by mouth daily.      No current facility-administered medications on file prior to visit.    Allergies  Allergen Reactions  . Cyclosporine Other (See Comments)    Medication interactions  . Nitroglycerin Other (See Comments)    Medication interactions  . Other Other (  See Comments)    Muscle relaxers - per patient interacts with other medications she is on.    Social History:   reports that she has been smoking cigarettes. She has been smoking about 1.00 pack per day. She has never used smokeless tobacco. She reports previous alcohol use. She reports previous drug use.  Family History  Problem Relation Age of Onset  . Healthy Mother   . Lung disease Father   . Heart disease Father   . Diabetes Father   . Hypertension Father   . Kidney cancer Father   . Colon cancer Paternal Grandfather     Review of Systems: Noncontributory  PHYSICAL EXAM: Blood pressure 126/74, height 5\' 8"  (1.727 m), weight 211 lb (95.7 kg). CONSTITUTIONAL: Well-developed, well-nourished female in no acute distress.  HENT:  Normocephalic, atraumatic, External right and left ear normal. Oropharynx is clear and moist EYES: Conjunctivae and EOM are normal. Pupils are equal, round, and reactive to light. No scleral icterus.  NECK: Normal range of  motion, supple, no masses SKIN: Skin is warm and dry. No rash noted. Not diaphoretic. No erythema. No pallor. [place]: Alert and oriented to person, place, and time. Normal reflexes, muscle tone coordination. No cranial nerve deficit noted. PSYCHIATRIC: Normal mood and affect. Normal behavior. Normal judgment and thought content. CARDIOVASCULAR: Normal heart rate noted, regular rhythm RESPIRATORY: Effort and breath sounds normal, no problems with respiration noted ABDOMEN: Soft, nontender, nondistended. PELVIC: Deferred MUSCULOSKELETAL: Normal range of motion. No edema and no tenderness. 2+ distal pulses.  Labs: No results found for this or any previous visit (from the past 336 hour(s)).  Imaging Studies: No results found.  Assessment: Patient Active Problem List   Diagnosis Date Noted  . Menorrhagia with irregular cycle 12/19/2018  Pulmonary arterial hypertension History of sinus node dysfunction requirement PPM placement  Plan: Patient will undergo surgical management with the above-noted surgery.   The risks of surgery were discussed in detail with the patient including but not limited to: bleeding which may require transfusion or reoperation; infection which may require antibiotics; injury to surrounding organs which may involve bowel, bladder, ureters ; need for additional procedures including laparoscopy or laparotomy; thromboembolic phenomenon, surgical site problems and other postoperative/anesthesia complications. Likelihood of success in alleviating the patient's condition was discussed. Routine postoperative instructions will be reviewed with the patient and her family in detail after surgery.  The patient concurred with the proposed plan, giving informed written consent for the surgery.  Preoperative prophylactic antibiotics, as indicated, and SCDs ordered on call to the OR.    Prentice Docker, MD 05/05/2019 4:49 PM

## 2019-05-05 NOTE — Progress Notes (Signed)
Preoperative History and Physical  Rachel Wall is a 38 y.o. [id number] here for surgical management of menorrhagia.   No significant preoperative concerns.  History of Present Illness: 38 y.o. [id number] female who has a long history of heavy, painful periods.  She had an IUD for seven years that was removed just 5 months ago. Since that time she had one period that was "aweful" for her.  See [name]'s note from March weeks ago.  She also had a pelvic ultrasound that showed an intramural, posterior fibroid that measured about 4 cm in each direction.  She has never taken any form of pills.  She bled frequently with her IUD. She had a lot of bleeding for the past year with her IUD, that had been in for 7 years.  She has tried every form of birth control and nothing worked. She greatly desires a hysterectomy and has been cleared by her cardiologist and pulmonologist with some specific management recommendations.    Proposed surgery: Total laparoscopic hysterectomy, bilateral salpingectomy, cystoscopy  Past Medical History:  Diagnosis Date  . 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 Depression   . GERD (gastroesophageal reflux disease)   . Pulmonary hypertension ([place])   . Tachycardia    Past Surgical History:  Procedure Laterality Date  . LITHOTRIPSY  2012  . PACEMAKER INSERTION  2013  . STENT REMOVAL    . TUBAL LIGATION  10/10/2011   OB History  Gravida Para Term Preterm AB Living  5 3 3   2 3   SAB TAB Ectopic Multiple Live Births  2       3    # Outcome Date GA Lbr Len/2nd Weight Sex Delivery Anes PTL Lv  5 SAB           4 SAB           3 Term           2 Term           1 Term           Patient denies any other pertinent gynecologic issues.   Current Outpatient Medications on File Prior to Visit  Medication Sig Dispense Refill  . albuterol (PROAIR HFA) 108 (90 Base) MCG/ACT inhaler Inhale 2 puffs into the lungs every 6 (six) hours as needed for wheezing or shortness of breath.      Marland Kitchen ambrisentan (LETAIRIS) 10 MG tablet Take 10 mg by mouth daily.     . citalopram (CELEXA) 40 MG tablet Take 40 mg by mouth daily.    Marland Kitchen diltiazem (CARDIZEM) 120 MG tablet Take 120-240 mg by mouth See admin instructions. Take 120 mg by mouth in the morning and take 240 mg by mouth at bedtime    . famotidine (PEPCID AC MAXIMUM STRENGTH) 20 MG tablet Take 40 mg by mouth 2 (two) times daily.    . norethindrone (AYGESTIN) 5 MG tablet Take 1 tablet (5 mg total) by mouth daily. (Patient not taking: Reported on 05/05/2019) 30 tablet 11  . Selexipag (UPTRAVI) 1200 MCG TABS Take 1,200 mcg by mouth 2 (two) times daily.     . tadalafil (ADCIRCA/CIALIS) 20 MG tablet Take 40 mg by mouth daily.      No current facility-administered medications on file prior to visit.    Allergies  Allergen Reactions  . Cyclosporine Other (See Comments)    Medication interactions  . Nitroglycerin Other (See Comments)    Medication interactions  . Other Other (  See Comments)    Muscle relaxers - per patient interacts with other medications she is on.    Social History:   reports that she has been smoking cigarettes. She has been smoking about 1.00 pack per day. She has never used smokeless tobacco. She reports previous alcohol use. She reports previous drug use.  Family History  Problem Relation Age of Onset  . Healthy Mother   . Lung disease Father   . Heart disease Father   . Diabetes Father   . Hypertension Father   . Kidney cancer Father   . Colon cancer Paternal Grandfather     Review of Systems: Noncontributory  PHYSICAL EXAM: Blood pressure 126/74, height 5\' 8"  (1.727 m), weight 211 lb (95.7 kg). CONSTITUTIONAL: Well-developed, well-nourished female in no acute distress.  HENT:  Normocephalic, atraumatic, External right and left ear normal. Oropharynx is clear and moist EYES: Conjunctivae and EOM are normal. Pupils are equal, round, and reactive to light. No scleral icterus.  NECK: Normal range of  motion, supple, no masses SKIN: Skin is warm and dry. No rash noted. Not diaphoretic. No erythema. No pallor. [place]: Alert and oriented to person, place, and time. Normal reflexes, muscle tone coordination. No cranial nerve deficit noted. PSYCHIATRIC: Normal mood and affect. Normal behavior. Normal judgment and thought content. CARDIOVASCULAR: Normal heart rate noted, regular rhythm RESPIRATORY: Effort and breath sounds normal, no problems with respiration noted ABDOMEN: Soft, nontender, nondistended. PELVIC: Deferred MUSCULOSKELETAL: Normal range of motion. No edema and no tenderness. 2+ distal pulses.  Labs: No results found for this or any previous visit (from the past 336 hour(s)).  Imaging Studies: No results found.  Assessment: Patient Active Problem List   Diagnosis Date Noted  . Menorrhagia with irregular cycle 12/19/2018  Pulmonary arterial hypertension History of sinus node dysfunction requirement PPM placement  Plan: Patient will undergo surgical management with the above-noted surgery.   The risks of surgery were discussed in detail with the patient including but not limited to: bleeding which may require transfusion or reoperation; infection which may require antibiotics; injury to surrounding organs which may involve bowel, bladder, ureters ; need for additional procedures including laparoscopy or laparotomy; thromboembolic phenomenon, surgical site problems and other postoperative/anesthesia complications. Likelihood of success in alleviating the patient's condition was discussed. Routine postoperative instructions will be reviewed with the patient and her family in detail after surgery.  The patient concurred with the proposed plan, giving informed written consent for the surgery.  Preoperative prophylactic antibiotics, as indicated, and SCDs ordered on call to the OR.    Prentice Docker, MD 05/05/2019 4:49 PM

## 2019-05-08 ENCOUNTER — Other Ambulatory Visit: Payer: Self-pay

## 2019-05-08 ENCOUNTER — Other Ambulatory Visit: Payer: 59

## 2019-05-08 ENCOUNTER — Encounter
Admission: RE | Admit: 2019-05-08 | Discharge: 2019-05-08 | Disposition: A | Discharge status: Home / Self Care | Payer: 59 | Source: Ambulatory Visit | Attending: Obstetrics and Gynecology | Admitting: Obstetrics and Gynecology

## 2019-05-08 DIAGNOSIS — N920 Excessive and frequent menstruation with regular cycle: Secondary | ICD-10-CM | POA: Insufficient documentation

## 2019-05-08 DIAGNOSIS — Z20828 Contact with and (suspected) exposure to other viral communicable diseases: Secondary | ICD-10-CM | POA: Insufficient documentation

## 2019-05-08 DIAGNOSIS — Z01812 Encounter for preprocedural laboratory examination: Secondary | ICD-10-CM | POA: Insufficient documentation

## 2019-05-08 HISTORY — DX: Presence of cardiac pacemaker: Z95.0

## 2019-05-08 HISTORY — DX: Personal history of urinary calculi: Z87.442

## 2019-05-08 LAB — COMPREHENSIVE METABOLIC PANEL
ALT: 13 U/L (ref 0–44)
AST: 12 U/L — ABNORMAL LOW (ref 15–41)
Albumin: 4.1 g/dL (ref 3.5–5.0)
Alkaline Phosphatase: 44 U/L (ref 38–126)
Anion gap: 5 (ref 5–15)
BUN: 12 mg/dL (ref 6–20)
CO2: 21 mmol/L — ABNORMAL LOW (ref 22–32)
Calcium: 9.2 mg/dL (ref 8.9–10.3)
Chloride: 112 mmol/L — ABNORMAL HIGH (ref 98–111)
Creatinine, Ser: 0.61 mg/dL (ref 0.44–1.00)
GFR calc Af Amer: >60 mL/min (ref >60)
GFR calc non Af Amer: >60 mL/min (ref >60)
Glucose, Bld: 107 mg/dL — ABNORMAL HIGH (ref 70–99)
Potassium: 4 mmol/L (ref 3.5–5.1)
Sodium: 138 mmol/L (ref 135–145)
Total Bilirubin: 0.7 mg/dL (ref 0.3–1.2)
Total Protein: 6.7 g/dL (ref 6.5–8.1)

## 2019-05-08 LAB — CBC
HCT: 44.4 % (ref 36.0–46.0)
Hemoglobin: 15 g/dL (ref 12.0–15.0)
MCH: 33.6 pg (ref 26.0–34.0)
MCHC: 33.8 g/dL (ref 30.0–36.0)
MCV: 99.3 fL (ref 80.0–100.0)
Platelets: 153 10*3/uL (ref 150–400)
RBC: 4.47 MIL/uL (ref 3.87–5.11)
RDW: 13.3 % (ref 11.5–15.5)
WBC: 8.2 10*3/uL (ref 4.0–10.5)
nRBC: 0 % (ref 0.0–0.2)

## 2019-05-08 LAB — TYPE AND SCREEN
ABO/RH(D): A POS
Antibody Screen: NEGATIVE

## 2019-05-08 NOTE — Pre-Procedure Instructions (Signed)
   ECG 12 Lead7/30/2020 [facility] Component Name Value Ref Range  EKG Systolic BP  mmHg  EKG Diastolic BP  mmHg  EKG Ventricular Rate 69 BPM  EKG Atrial Rate 69 BPM  EKG P-R Interval 248 ms  EKG QRS Duration 98 ms  EKG Q-T Interval 390 ms  EKG QTC Calculation 417 ms  EKG Calculated P Axis 73 degrees  EKG Calculated R Axis 96 degrees  EKG Calculated T Axis 67 degrees  QTC Fredericia 408 ms  Result Narrative  ATRIAL-PACED RHYTHM WITH PROLONGED AV CONDUCTION RIGHTWARD AXIS ABNORMAL ECG WHEN COMPARED WITH ECG OF 15-Sep-2018 10:24, NO SIGNIFICANT CHANGE WAS FOUND Confirmed by Shara Blazing ([id number]) on 04/30/2019 11:52:44 PM  Other Result Information  Interface, Rad Results In - 04/30/2019 11:52 PM EDT ATRIAL-PACED RHYTHM WITH PROLONGED AV CONDUCTION RIGHTWARD AXIS ABNORMAL ECG WHEN COMPARED WITH ECG OF 15-Sep-2018 10:24, NO SIGNIFICANT CHANGE WAS FOUND Confirmed by Shara Blazing ([id number]) on 04/30/2019 11:52:44 PM

## 2019-05-08 NOTE — Patient Instructions (Addendum)
Your procedure is scheduled on: Tuesday 05/12/19 Report to [place]. To find out your arrival time please call [phone] between 1PM - 3PM on Monday 05/11/19 .  Remember: Instructions that are not followed completely may result in serious medical risk, up to and including death, or upon the discretion of your surgeon and anesthesiologist your surgery may need to be rescheduled.     _X__ 1. Do not eat food after midnight the night before your procedure.                 No gum chewing or hard candies. You may drink clear liquids up to 2 hours                 before you are scheduled to arrive for your surgery- DO not drink clear                 liquids within 2 hours of the start of your surgery.                 Clear Liquids include:  water, apple juice without pulp, clear carbohydrate                 drink such as Clearfast or Gatorade, Black Coffee or Tea (Do not add                 anything to coffee or tea). Diabetics water only  __X__2.  On the morning of surgery brush your teeth with toothpaste and water, you                 may rinse your mouth with mouthwash if you wish.  Do not swallow any              toothpaste of mouthwash.     _X__ 3.  No Alcohol for 24 hours before or after surgery.   _X__ 4.  Do Not Smoke or use e-cigarettes For 24 Hours Prior to Your Surgery.                 Do not use any chewable tobacco products for at least 6 hours prior to                 surgery.  ____  5.  Bring all medications with you on the day of surgery if instructed.   __X__  6.  Notify your doctor if there is any change in your medical condition      (cold, fever, infections).     Do not wear jewelry, make-up, hairpins, clips or nail polish. Do not wear lotions, powders, or perfumes.  Do not shave 48 hours prior to surgery. Men may shave face and neck. Do not bring valuables to the hospital.    [facility] is not responsible for  any belongings or valuables.  Contacts, dentures/partials or body piercings may not be worn into surgery. Bring a case for your contacts, glasses or hearing aids, a denture cup will be supplied. Leave your suitcase in the car. After surgery it may be brought to your room. For patients admitted to the hospital, discharge time is determined by your treatment team.   Patients discharged the day of surgery will not be allowed to drive home.   Please read over the following fact sheets that you were given:   MRSA Information  __X__ Take these medicines the morning of surgery with A SIP OF  WATER:    1. ambrisentan (LETAIRIS)  2. citalopram (CELEXA  3. diltiazem (CARDIZEM  4. famotidine (PEPCID AC MAXIMUM STRENGTH  5. Selexipag (UPTRAVI  6. tadalafil (ADCIRCA/CIALIS)   ____ Fleet Enema (as directed)   __X__ Use CHG Soap/SAGE wipes as directed  __X__ Use inhalers on the day of surgery  ____ Stop metformin/Janumet/Farxiga 2 days prior to surgery    ____ Take 1/2 of usual insulin dose the night before surgery. No insulin the morning          of surgery.   ____ Stop Blood Thinners Coumadin/Plavix/Xarelto/Pleta/Pradaxa/Eliquis/Effient/Aspirin  on   Or contact your Surgeon, Cardiologist or Medical Doctor regarding  ability to stop your blood thinners  __X__ Stop Anti-inflammatories 7 days before surgery such as Advil, Ibuprofen, Motrin,  BC or Goodies Powder, Naprosyn, Naproxen, Aleve, Aspirin    __X__ Stop all herbal supplements, fish oil or vitamin E until after surgery.    ____ Bring C-Pap to the hospital.

## 2019-05-09 ENCOUNTER — Encounter: Payer: Self-pay | Admitting: Obstetrics and Gynecology

## 2019-05-09 LAB — SARS CORONAVIRUS 2 (TAT 6-24 HRS): SARS Coronavirus 2: NEGATIVE

## 2019-05-11 MED ORDER — CEFAZOLIN SODIUM-DEXTROSE 2-4 GM/100ML-% IV SOLN
2.0000 g | INTRAVENOUS | Status: AC
  Administered 2019-05-12: 2 g via INTRAVENOUS

## 2019-05-11 NOTE — Progress Notes (Signed)
Copy of latest pacemaker interrogation filed under media.  Patient's cardiologist unavailable for comment on management of device throughout surgery.  Interrogation shows that patient is not pacemaker dependent.  Per [employer] Rep, Lowry Bowl, "a magnet can be applied throughout the surgery and removed once surgery is complete with no need to reprogram or interrogate.

## 2019-05-12 ENCOUNTER — Observation Stay
Admission: RE | Admit: 2019-05-12 | Discharge: 2019-05-13 | Disposition: A | Discharge status: Home / Self Care | Payer: 59 | Source: Ambulatory Visit | Attending: Obstetrics and Gynecology | Admitting: Obstetrics and Gynecology

## 2019-05-12 ENCOUNTER — Ambulatory Visit: Payer: 59 | Admitting: Registered Nurse

## 2019-05-12 ENCOUNTER — Other Ambulatory Visit: Payer: Self-pay

## 2019-05-12 ENCOUNTER — Encounter
Admission: RE | Disposition: A | Discharge status: Home / Self Care | Payer: Self-pay | Source: Ambulatory Visit | Attending: Obstetrics and Gynecology

## 2019-05-12 DIAGNOSIS — N920 Excessive and frequent menstruation with regular cycle: Principal | ICD-10-CM | POA: Insufficient documentation

## 2019-05-12 DIAGNOSIS — F329 Major depressive disorder, single episode, unspecified: Secondary | ICD-10-CM | POA: Insufficient documentation

## 2019-05-12 DIAGNOSIS — F17210 Nicotine dependence, cigarettes, uncomplicated: Secondary | ICD-10-CM | POA: Insufficient documentation

## 2019-05-12 DIAGNOSIS — Z79899 Other long term (current) drug therapy: Secondary | ICD-10-CM | POA: Insufficient documentation

## 2019-05-12 DIAGNOSIS — Z950 Presence of cardiac pacemaker: Secondary | ICD-10-CM | POA: Diagnosis not present

## 2019-05-12 DIAGNOSIS — N921 Excessive and frequent menstruation with irregular cycle: Secondary | ICD-10-CM | POA: Diagnosis present

## 2019-05-12 DIAGNOSIS — I2721 Secondary pulmonary arterial hypertension: Secondary | ICD-10-CM | POA: Diagnosis not present

## 2019-05-12 DIAGNOSIS — K219 Gastro-esophageal reflux disease without esophagitis: Secondary | ICD-10-CM | POA: Insufficient documentation

## 2019-05-12 DIAGNOSIS — Z8249 Family history of ischemic heart disease and other diseases of the circulatory system: Secondary | ICD-10-CM | POA: Diagnosis not present

## 2019-05-12 DIAGNOSIS — D251 Intramural leiomyoma of uterus: Secondary | ICD-10-CM

## 2019-05-12 DIAGNOSIS — Z90710 Acquired absence of both cervix and uterus: Secondary | ICD-10-CM | POA: Diagnosis present

## 2019-05-12 DIAGNOSIS — Z888 Allergy status to other drugs, medicaments and biological substances status: Secondary | ICD-10-CM | POA: Diagnosis not present

## 2019-05-12 DIAGNOSIS — D259 Leiomyoma of uterus, unspecified: Secondary | ICD-10-CM | POA: Diagnosis not present

## 2019-05-12 DIAGNOSIS — I495 Sick sinus syndrome: Secondary | ICD-10-CM | POA: Diagnosis not present

## 2019-05-12 HISTORY — PX: TOTAL LAPAROSCOPIC HYSTERECTOMY WITH SALPINGECTOMY: SHX6742

## 2019-05-12 HISTORY — PX: CYSTOSCOPY: SHX5120

## 2019-05-12 LAB — POCT PREGNANCY, URINE: Preg Test, Ur: NEGATIVE

## 2019-05-12 LAB — PREGNANCY, URINE: Preg Test, Ur: NEGATIVE

## 2019-05-12 LAB — URINE DRUG SCREEN, QUALITATIVE (ARMC ONLY)
Amphetamines, Ur Screen: NOT DETECTED
Barbiturates, Ur Screen: NOT DETECTED
Benzodiazepine, Ur Scrn: NOT DETECTED
Cannabinoid 50 Ng, Ur ~~LOC~~: NOT DETECTED
Cocaine Metabolite,Ur ~~LOC~~: NOT DETECTED
MDMA (Ecstasy)Ur Screen: NOT DETECTED
Methadone Scn, Ur: NOT DETECTED
Opiate, Ur Screen: NOT DETECTED
Phencyclidine (PCP) Ur S: NOT DETECTED
Tricyclic, Ur Screen: NOT DETECTED

## 2019-05-12 LAB — ABO/RH: ABO/RH(D): A POS

## 2019-05-12 SURGERY — HYSTERECTOMY, TOTAL, LAPAROSCOPIC, WITH SALPINGECTOMY
Anesthesia: General

## 2019-05-12 MED ORDER — FENTANYL CITRATE (PF) 100 MCG/2ML IJ SOLN
25.0000 ug | INTRAMUSCULAR | Status: DC | PRN
  Administered 2019-05-12 (×5): 25 ug via INTRAVENOUS

## 2019-05-12 MED ORDER — IBUPROFEN 600 MG PO TABS
600.0000 mg | ORAL_TABLET | Freq: Four times a day (QID) | ORAL | Status: DC
  Administered 2019-05-12 – 2019-05-13 (×4): 600 mg via ORAL
  Filled 2019-05-12 (×4): qty 1

## 2019-05-12 MED ORDER — FENTANYL CITRATE (PF) 100 MCG/2ML IJ SOLN
INTRAMUSCULAR | Status: AC
  Administered 2019-05-12: 25 ug via INTRAVENOUS
  Filled 2019-05-12: qty 2

## 2019-05-12 MED ORDER — CEFAZOLIN SODIUM-DEXTROSE 2-4 GM/100ML-% IV SOLN
INTRAVENOUS | Status: AC
  Filled 2019-05-12: qty 100

## 2019-05-12 MED ORDER — DEXAMETHASONE SODIUM PHOSPHATE 10 MG/ML IJ SOLN
INTRAMUSCULAR | Status: DC | PRN
  Administered 2019-05-12: 10 mg via INTRAVENOUS

## 2019-05-12 MED ORDER — PROPOFOL 10 MG/ML IV BOLUS
INTRAVENOUS | Status: AC
  Filled 2019-05-12: qty 40

## 2019-05-12 MED ORDER — IPRATROPIUM-ALBUTEROL 0.5-2.5 (3) MG/3ML IN SOLN
3.0000 mL | Freq: Once | RESPIRATORY_TRACT | Status: AC
  Administered 2019-05-12: 11:00:00 3 mL via RESPIRATORY_TRACT

## 2019-05-12 MED ORDER — OXYCODONE-ACETAMINOPHEN 5-325 MG PO TABS
ORAL_TABLET | ORAL | Status: AC
  Administered 2019-05-12: 11:00:00 1 via ORAL
  Filled 2019-05-12: qty 1

## 2019-05-12 MED ORDER — FENTANYL CITRATE (PF) 100 MCG/2ML IJ SOLN
INTRAMUSCULAR | Status: AC
  Filled 2019-05-12: qty 2

## 2019-05-12 MED ORDER — LACTATED RINGERS IV SOLN
INTRAVENOUS | Status: DC
  Administered 2019-05-12: 07:00:00 via INTRAVENOUS

## 2019-05-12 MED ORDER — ONDANSETRON HCL 4 MG/2ML IJ SOLN
INTRAMUSCULAR | Status: DC | PRN
  Administered 2019-05-12: 4 mg via INTRAVENOUS

## 2019-05-12 MED ORDER — ONDANSETRON HCL 4 MG/2ML IJ SOLN: 4.0000 mg | Freq: Four times a day (QID) | INTRAMUSCULAR | Status: DC | PRN

## 2019-05-12 MED ORDER — FENTANYL CITRATE (PF) 100 MCG/2ML IJ SOLN
INTRAMUSCULAR | Status: DC | PRN
  Administered 2019-05-12 (×2): 50 ug via INTRAVENOUS
  Administered 2019-05-12: 100 ug via INTRAVENOUS

## 2019-05-12 MED ORDER — DEXAMETHASONE SODIUM PHOSPHATE 10 MG/ML IJ SOLN
INTRAMUSCULAR | Status: AC
  Filled 2019-05-12: qty 1

## 2019-05-12 MED ORDER — LIDOCAINE HCL (CARDIAC) PF 100 MG/5ML IV SOSY
PREFILLED_SYRINGE | INTRAVENOUS | Status: DC | PRN
  Administered 2019-05-12: 100 mg via INTRAVENOUS

## 2019-05-12 MED ORDER — AMBRISENTAN 5 MG PO TABS: 10.0000 mg | ORAL_TABLET | Freq: Every day | ORAL | Status: DC

## 2019-05-12 MED ORDER — CITALOPRAM HYDROBROMIDE 20 MG PO TABS
40.0000 mg | ORAL_TABLET | Freq: Every day | ORAL | Status: DC
  Filled 2019-05-12: qty 2

## 2019-05-12 MED ORDER — SELEXIPAG 1200 MCG PO TABS
1200.0000 ug | ORAL_TABLET | Freq: Two times a day (BID) | ORAL | Status: DC
  Administered 2019-05-12: 21:00:00 1200 ug via ORAL
  Filled 2019-05-12: qty 60

## 2019-05-12 MED ORDER — DILTIAZEM HCL 60 MG PO TABS
120.0000 mg | ORAL_TABLET | Freq: Three times a day (TID) | ORAL | Status: DC
  Administered 2019-05-12 – 2019-05-13 (×3): 120 mg via ORAL
  Filled 2019-05-12 (×5): qty 2

## 2019-05-12 MED ORDER — LACTATED RINGERS IV SOLN
INTRAVENOUS | Status: DC
  Administered 2019-05-12: 16:00:00 via INTRAVENOUS

## 2019-05-12 MED ORDER — MIDAZOLAM HCL 2 MG/2ML IJ SOLN
INTRAMUSCULAR | Status: AC
  Filled 2019-05-12: qty 2

## 2019-05-12 MED ORDER — DOCUSATE SODIUM 100 MG PO CAPS
100.0000 mg | ORAL_CAPSULE | Freq: Two times a day (BID) | ORAL | Status: DC
  Administered 2019-05-12: 100 mg via ORAL
  Filled 2019-05-12: qty 1

## 2019-05-12 MED ORDER — TADALAFIL 20 MG PO TABS
40.0000 mg | ORAL_TABLET | Freq: Every day | ORAL | Status: DC
  Filled 2019-05-12: qty 2

## 2019-05-12 MED ORDER — ONDANSETRON HCL 4 MG/2ML IJ SOLN
INTRAMUSCULAR | Status: AC
  Filled 2019-05-12: qty 2

## 2019-05-12 MED ORDER — MENTHOL 3 MG MT LOZG
1.0000 | LOZENGE | OROMUCOSAL | Status: DC | PRN
  Filled 2019-05-12: qty 9

## 2019-05-12 MED ORDER — PROPOFOL 10 MG/ML IV BOLUS
INTRAVENOUS | Status: DC | PRN
  Administered 2019-05-12: 150 mg via INTRAVENOUS

## 2019-05-12 MED ORDER — OXYCODONE-ACETAMINOPHEN 5-325 MG PO TABS
1.0000 | ORAL_TABLET | ORAL | Status: DC | PRN
  Administered 2019-05-12 – 2019-05-13 (×6): 1 via ORAL
  Filled 2019-05-12 (×5): qty 1

## 2019-05-12 MED ORDER — ALBUTEROL SULFATE HFA 108 (90 BASE) MCG/ACT IN AERS
INHALATION_SPRAY | RESPIRATORY_TRACT | Status: DC | PRN
  Administered 2019-05-12 (×2): 6 via RESPIRATORY_TRACT

## 2019-05-12 MED ORDER — ACETAMINOPHEN NICU IV SYRINGE 10 MG/ML
INTRAVENOUS | Status: AC
  Filled 2019-05-12: qty 1

## 2019-05-12 MED ORDER — HYDROMORPHONE HCL 1 MG/ML IJ SOLN: 0.2000 mg | INTRAMUSCULAR | Status: DC | PRN

## 2019-05-12 MED ORDER — SIMETHICONE 80 MG PO CHEW
80.0000 mg | CHEWABLE_TABLET | Freq: Four times a day (QID) | ORAL | Status: DC | PRN
  Administered 2019-05-12 – 2019-05-13 (×4): 80 mg via ORAL
  Filled 2019-05-12 (×4): qty 1

## 2019-05-12 MED ORDER — BUPIVACAINE HCL (PF) 0.5 % IJ SOLN
INTRAMUSCULAR | Status: AC
  Filled 2019-05-12: qty 30

## 2019-05-12 MED ORDER — FAMOTIDINE 20 MG PO TABS
20.0000 mg | ORAL_TABLET | Freq: Two times a day (BID) | ORAL | Status: DC
  Administered 2019-05-12: 20 mg via ORAL
  Filled 2019-05-12: qty 1

## 2019-05-12 MED ORDER — PHENYLEPHRINE HCL (PRESSORS) 10 MG/ML IV SOLN
INTRAVENOUS | Status: AC
  Filled 2019-05-12: qty 1

## 2019-05-12 MED ORDER — LIDOCAINE HCL (PF) 2 % IJ SOLN
INTRAMUSCULAR | Status: AC
  Filled 2019-05-12: qty 10

## 2019-05-12 MED ORDER — ALBUTEROL SULFATE (2.5 MG/3ML) 0.083% IN NEBU: 3.0000 mL | INHALATION_SOLUTION | Freq: Four times a day (QID) | RESPIRATORY_TRACT | Status: DC | PRN

## 2019-05-12 MED ORDER — KETOROLAC TROMETHAMINE 30 MG/ML IJ SOLN
INTRAMUSCULAR | Status: DC | PRN
  Administered 2019-05-12: 30 mg via INTRAVENOUS

## 2019-05-12 MED ORDER — MIDAZOLAM HCL 2 MG/2ML IJ SOLN
INTRAMUSCULAR | Status: DC | PRN
  Administered 2019-05-12: 2 mg via INTRAVENOUS

## 2019-05-12 MED ORDER — SUGAMMADEX SODIUM 200 MG/2ML IV SOLN
INTRAVENOUS | Status: AC
  Filled 2019-05-12: qty 2

## 2019-05-12 MED ORDER — IPRATROPIUM-ALBUTEROL 0.5-2.5 (3) MG/3ML IN SOLN
RESPIRATORY_TRACT | Status: AC
  Administered 2019-05-12: 11:00:00 3 mL via RESPIRATORY_TRACT
  Filled 2019-05-12: qty 3

## 2019-05-12 MED ORDER — ONDANSETRON HCL 4 MG PO TABS: 4.0000 mg | ORAL_TABLET | Freq: Four times a day (QID) | ORAL | Status: DC | PRN

## 2019-05-12 MED ORDER — ACETAMINOPHEN 10 MG/ML IV SOLN
INTRAVENOUS | Status: DC | PRN
  Administered 2019-05-12: 1000 mg via INTRAVENOUS

## 2019-05-12 MED ORDER — ONDANSETRON HCL 4 MG/2ML IJ SOLN: 4.0000 mg | Freq: Once | INTRAMUSCULAR | Status: DC | PRN

## 2019-05-12 MED ORDER — BUPIVACAINE HCL 0.5 % IJ SOLN
INTRAMUSCULAR | Status: DC | PRN
  Administered 2019-05-12: 10 mL

## 2019-05-12 MED ORDER — SUGAMMADEX SODIUM 200 MG/2ML IV SOLN
INTRAVENOUS | Status: DC | PRN
  Administered 2019-05-12: 200 mg via INTRAVENOUS

## 2019-05-12 MED ORDER — ROCURONIUM BROMIDE 100 MG/10ML IV SOLN
INTRAVENOUS | Status: DC | PRN
  Administered 2019-05-12: 50 mg via INTRAVENOUS
  Administered 2019-05-12: 10 mg via INTRAVENOUS

## 2019-05-12 SURGICAL SUPPLY — 54 items
BAG URINE DRAINAGE (UROLOGICAL SUPPLIES) ×5 IMPLANT
BLADE SURG SZ11 CARB STEEL (BLADE) ×5 IMPLANT
CATH FOLEY 2WAY  5CC 16FR (CATHETERS) ×2
CATH URTH 16FR FL 2W BLN LF (CATHETERS) ×3 IMPLANT
CHLORAPREP W/TINT 26 (MISCELLANEOUS) ×5 IMPLANT
COVER WAND RF STERILE (DRAPES) ×5 IMPLANT
DEFOGGER SCOPE WARMER CLEARIFY (MISCELLANEOUS) IMPLANT
DERMABOND ADVANCED (GAUZE/BANDAGES/DRESSINGS) ×2
DERMABOND ADVANCED .7 DNX12 (GAUZE/BANDAGES/DRESSINGS) ×3 IMPLANT
DEVICE SUTURE ENDOST 10MM (ENDOMECHANICALS) ×5 IMPLANT
DRAPE 3/4 80X56 (DRAPES) ×5 IMPLANT
DRAPE CAMERA CLOSED 9X96 (DRAPES) ×5 IMPLANT
DRAPE LEGGINS SURG 28X43 STRL (DRAPES) ×10 IMPLANT
DRAPE UNDER BUTTOCK W/FLU (DRAPES) ×5 IMPLANT
GLOVE BIO SURGEON STRL SZ7 (GLOVE) ×15 IMPLANT
GLOVE INDICATOR 7.5 STRL GRN (GLOVE) ×15 IMPLANT
GOWN STRL REUS W/ TWL LRG LVL3 (GOWN DISPOSABLE) ×9 IMPLANT
GOWN STRL REUS W/TWL LRG LVL3 (GOWN DISPOSABLE) ×6
GRASPER SUT TROCAR 14GX15 (MISCELLANEOUS) ×5 IMPLANT
IRRIGATION STRYKERFLOW (MISCELLANEOUS) ×3 IMPLANT
IRRIGATOR STRYKERFLOW (MISCELLANEOUS) ×5
IV LACTATED RINGERS 1000ML (IV SOLUTION) ×10 IMPLANT
KIT PINK PAD W/HEAD ARE REST (MISCELLANEOUS) ×5
KIT PINK PAD W/HEAD ARM REST (MISCELLANEOUS) ×3 IMPLANT
KIT TURNOVER CYSTO (KITS) ×5 IMPLANT
LABEL OR SOLS (LABEL) ×5 IMPLANT
LIGASURE VESSEL 5MM BLUNT TIP (ELECTROSURGICAL) ×5 IMPLANT
MANIPULATOR VCARE LG CRV RETR (MISCELLANEOUS) ×5 IMPLANT
MANIPULATOR VCARE SML CRV RETR (MISCELLANEOUS) IMPLANT
MANIPULATOR VCARE STD CRV RETR (MISCELLANEOUS) IMPLANT
NEEDLE HYPO 22GX1.5 SAFETY (NEEDLE) ×5 IMPLANT
OCCLUDER COLPOPNEUMO (BALLOONS) ×5 IMPLANT
PACK LAP CHOLECYSTECTOMY (MISCELLANEOUS) ×5 IMPLANT
PAD OB MATERNITY 4.3X12.25 (PERSONAL CARE ITEMS) ×5 IMPLANT
PAD PREP 24X41 OB/GYN DISP (PERSONAL CARE ITEMS) ×5 IMPLANT
SCISSORS METZENBAUM CVD 33 (INSTRUMENTS) ×5 IMPLANT
SET CYSTO W/LG BORE CLAMP LF (SET/KITS/TRAYS/PACK) ×5 IMPLANT
SET TRI-LUMEN FLTR TB AIRSEAL (TUBING) ×5 IMPLANT
SLEEVE ENDOPATH XCEL 5M (ENDOMECHANICALS) ×10 IMPLANT
SOL PREP PVP 2OZ (MISCELLANEOUS) ×5
SOLUTION PREP PVP 2OZ (MISCELLANEOUS) ×3 IMPLANT
SURGILUBE 2OZ TUBE FLIPTOP (MISCELLANEOUS) ×5 IMPLANT
SUT ENDO VLOC 180-0-8IN (SUTURE) ×5 IMPLANT
SUT MNCRL 4-0 (SUTURE) ×2
SUT MNCRL 4-0 27XMFL (SUTURE) ×3
SUT VIC AB 0 CT1 36 (SUTURE) ×5 IMPLANT
SUTURE MNCRL 4-0 27XMF (SUTURE) ×3 IMPLANT
SYR 10ML LL (SYRINGE) ×5 IMPLANT
SYR 50ML LL SCALE MARK (SYRINGE) ×5 IMPLANT
TROCAR ENDO BLADELESS 11MM (ENDOMECHANICALS) ×5 IMPLANT
TROCAR PORT AIRSEAL 8X100 (TROCAR) ×5 IMPLANT
TROCAR XCEL NON-BLD 5MMX100MML (ENDOMECHANICALS) ×10 IMPLANT
TUBING EVAC SMOKE HEATED PNEUM (TUBING) ×5 IMPLANT
WIPE NON LINTING 3.25X3.25 (MISCELLANEOUS) ×5 IMPLANT

## 2019-05-12 NOTE — Anesthesia Postprocedure Evaluation (Signed)
Anesthesia Post Note  Patient: Rachel Wall  Procedure(s) Performed: TOTAL LAPAROSCOPIC HYSTERECTOMY (Bilateral ) CYSTOSCOPY  Patient location during evaluation: PACU Anesthesia Type: General Level of consciousness: awake and alert Pain management: pain level controlled Vital Signs Assessment: post-procedure vital signs reviewed and stable Respiratory status: spontaneous breathing, nonlabored ventilation, respiratory function stable and patient connected to nasal cannula oxygen Cardiovascular status: blood pressure returned to baseline and stable Postop Assessment: no apparent nausea or vomiting Anesthetic complications: no     Last Vitals:  Vitals:   05/12/19 1108 05/12/19 1123  BP: 106/60 109/62  Pulse: 72 67  Resp: 19 20  Temp:  36.7 C  SpO2: 96% 98%    Last Pain:  Vitals:   05/12/19 1123  TempSrc:   PainSc: 6                  Molli Barrows

## 2019-05-12 NOTE — Interval H&P Note (Signed)
History and Physical Interval Note:  05/12/2019 7:22 AM  Rachel Wall  has presented today for surgery, with the diagnosis of [place].  The various methods of treatment have been discussed with the patient and family. After consideration of risks, benefits and other options for treatment, the patient has consented to  Procedure(s): TOTAL LAPAROSCOPIC HYSTERECTOMY WITH BILATERAL SALPINGECTOMY with CYSTOSCOPY (N/A) as a surgical intervention.  The patient's history has been reviewed, patient examined, no change in status, stable for surgery.  I have reviewed the patient's chart and labs.  Questions were answered to the patient's satisfaction.  We reviewed that her cardiologist called her "intermediate risk for an intermediate risk surgery." She understands that there is some risk to her cardiopulmonary health and complications could arise.  Her pulmonologist also has cleared her.  She is optimized for surgery according to their most recent notes.  Consents reviewed and again and she wishes to proceed.  Prentice Docker, MD, [facility], [facility] 05/12/2019 7:24 AM

## 2019-05-12 NOTE — Op Note (Signed)
Operative Note    Pre-Operative Diagnosis:  1) Menorrhagia with irregular cycles 2) Fibroid Uterus  Post-Operative Diagnosis:  1) Menorrhagia with irregular cycles 2) Fibroid Uterus  Procedures:  1. Total Laparoscopic Hysterectomy 2. Cystoscopy  Primary Surgeon: Prentice Docker, MD   Assistant Surgeon: Barnett Applebaum, MD; No other capable assistant available, in surgery requiring high level assistant.  EBL: 50 mL   IVF: 400 mL crystalloid  Urine output: 400 mL clear urine at end of procedure  Specimens: Uterus and cervix  Drains: none  Complications: None   Disposition: PACU   Condition: Stable   Findings:  1) Enlarged uterus with posterior fibroid 2) absent bilateral fallopian tubes 3) normal appearing bilateral ovaries 4) on cystoscopy, efflux of clear urine from the bilateral orifices. No apparent injury to bladder wall  Procedure Summary:  The patient was taken to the operating room where general anesthesia was administered and found to be adequate. She was placed in the dorsal supine lithotomy position in [place] stirrups and prepped and draped in usual sterile fashion. After a timeout was called, an indwelling catheter was placed in her bladder. A sterile speculum was placed in the vagina and a single-tooth tenaculum was used to grasp the anterior lip of the cervix. A V-Care uterine manipulator was affixed to the uterus in accordance to the manufacturers recommendations. The speculum and tenaculum was removed from the vagina.  Attention was turned to the abdomen where, after injection of local anesthetic, a 5 mm infraumbilical incision was made with the scalpel. Entry into the abdomen was obtained via Optiview trocar technique (a blunt entry technique with camera visualization through the obturator upon entry). Verification of entry into the abdomen was obtained using opening pressures. The abdomen was insufflated with CO2 to a maximum pressure of 10 mmHg. The camera  was introduced through the trocar with verification of atraumatic entry. A left lower quadrant 5 mm port was created via direct intra-abdominal camera visualization without difficulty. An 8 mm  AirSeal right lower quadrant port was placed in a similar fashion without difficulty.  After inspection of the abdomen and pelvis with the above-noted findings, the bilateral ureters were identified and found to be well away from the operative area of interest. The LigaSure was used to transect the right round ligament and the utero-ovarian ligament was transected. Tissue was divided along the right broad ligament to the level of the anterior cervical os. The lower uterine segment was identified. Bladder tissue was dissected off the lower uterine segment and cervix without difficulty. The right uterine artery was skeletonized and identified and after ligation was transected with the LigaSure device. The same procedure was carried out on the left side. The colpotomy was performed using monopolar electrocautery in a circumferential fashion following the KOH ring.  The vaginal occluder balloon was insufflated prior to colpotomy to prevent loss of pneumoperitoneum. The uterus and cervix were removed through the vagina.  Cystoscopy was undertaken at this point. The Foley catheter was removed and the 70 cystoscope was gently introduced through the urethra. The bladder survey was undertaken with efflux of urine from both orifices noted. There were no defects noted in the bladder wall. The cystoscope was removed and the Foley catheter was replaced.  Attention was returned to the pelvis and closure of the vaginal cuff was undertaken using the V-lock stitch in a running fashion. A gloved hand was placed in the vagina to assess adequate closure of the vaginal cuff. This was found to be satisfactory. All  vascular pedicles were inspected and found to be hemostatic. Copious irrigation was undertaken and hemostasis was again  verified. Intraabdominal pressure was lowered to 5 mmHg and hemostasis was again verified.  The right lower quadrant trocar was removed and the fascia was reapproximated using #0 Vicryl with a single stitch. The abdomen was then desufflated of CO2 after removal of all instruments. Five deep breaths were given by anesthesia to the patient to help with removal of CO2 from the abdomen. The right lower quadrant skin incision was closed using 4-0 Vicryl in a subcuticular fashion. The remaining skin incisions were closed using surgical skin glue and a layer of surgical skin glue was placed over the right lower quadrant skin incision, as well. The catheter was then removed from the bladder. The vagina was inspected and found to be free of instrumentation and sponges.  The patient tolerated the procedure well.  Sponge, lap, needle, and instrument counts were correct x 2.  VTE prophylaxis: SCDs. Antibiotic prophylaxis: Ancef 2 grams IV was given within 30 minutes of skin incision. She was awakened in the operating room and was taken to the PACU in stable condition. The assistant surgeon was an MD due to lack of availability of another Counselling psychologist.   Prentice Docker, MD 05/12/2019 9:41 AM

## 2019-05-12 NOTE — Transfer of Care (Signed)
Immediate Anesthesia Transfer of Care Note  Patient: Rachel Wall  Procedure(s) Performed: TOTAL LAPAROSCOPIC HYSTERECTOMY (Bilateral ) CYSTOSCOPY  Patient Location: PACU  Anesthesia Type:General  Level of Consciousness: awake and alert   Airway & Oxygen Therapy: Patient Spontanous Breathing and Patient connected to face mask oxygen  Post-op Assessment: Report given to RN and Post -op Vital signs reviewed and stable  Post vital signs: Reviewed and stable  Last Vitals:  Vitals Value Taken Time  BP 115/59 05/12/19 0954  Temp 37.3 C 05/12/19 0954  Pulse 89 05/12/19 1003  Resp 13 05/12/19 1003  SpO2 96 % 05/12/19 1003    Last Pain:  Vitals:   05/12/19 0616  TempSrc: Temporal  PainSc: 0-No pain         Complications: No apparent anesthesia complications

## 2019-05-12 NOTE — OR Nursing (Signed)
Patient states she can use tegaderm, used tegaderm to reinforce IV.  Dr Andree Elk reviewed UDS and Pacemaker information.

## 2019-05-12 NOTE — Anesthesia Preprocedure Evaluation (Signed)
Anesthesia Evaluation  Patient identified by MRN, date of birth, ID band Patient awake    Reviewed: Allergy & Precautions, H&P , NPO status , Patient's Chart, lab work & pertinent test results, reviewed documented beta blocker date and time   Airway Mallampati: II  TM Distance: >3 FB Neck ROM: full    Dental  (+) Teeth Intact   Pulmonary neg pulmonary ROS, Current Smoker and Patient abstained from smoking.,    Pulmonary exam normal        Cardiovascular Exercise Tolerance: Good Normal cardiovascular exam+ pacemaker  Rhythm:regular Rate:Normal  Cardiac clearance and recent PM eval ok. Hx of sinus node dysfunctionJA   Neuro/Psych PSYCHIATRIC DISORDERS Depression negative neurological ROS     GI/Hepatic Neg liver ROS, GERD  Medicated,  Endo/Other  negative endocrine ROS  Renal/GU negative Renal ROS  negative genitourinary   Musculoskeletal   Abdominal   Peds  Hematology negative hematology ROS (+)   Anesthesia Other Findings Past Medical History: No date: Bradycardia     Comment:  has pacemaker No date: Depression No date: GERD (gastroesophageal reflux disease) No date: History of kidney stones No date: Presence of permanent cardiac pacemaker No date: Pulmonary hypertension ([place]) No date: Tachycardia Past Surgical History: No date: heart monitor implant and removal No date: INSERT / REPLACE / REMOVE PACEMAKER 2012: LITHOTRIPSY 2013: PACEMAKER INSERTION No date: STENT REMOVAL 10/10/2011: TUBAL LIGATION   Reproductive/Obstetrics negative OB ROS                             Anesthesia Physical Anesthesia Plan  ASA: III  Anesthesia Plan: General ETT   Post-op Pain Management:    Induction:   PONV Risk Score and Plan: 3  Airway Management Planned:   Additional Equipment:   Intra-op Plan:   Post-operative Plan:   Informed Consent: I have reviewed the patients History and  Physical, chart, labs and discussed the procedure including the risks, benefits and alternatives for the proposed anesthesia with the patient or authorized representative who has indicated his/her understanding and acceptance.     Dental Advisory Given  Plan Discussed with: CRNA  Anesthesia Plan Comments:         Anesthesia Quick Evaluation

## 2019-05-12 NOTE — OR Nursing (Signed)
Consent corrected with per Dr Glennon Mac to say " Total laparoscopic hysterectomy with bilateral salpingectomy with cystoscopy"

## 2019-05-12 NOTE — Progress Notes (Signed)
Pt stating pain 7/10. Pt given 80mcg Fentanyl and pt dosing off with O2 sats dropping into mid 80's with 2 liters oxygen via nasal cannula. Pt given Percocet. Dr. Andree Elk notified that after given IV pain medication pt O2 sats dropping in to the 80's. And that pt was given Percocet. Dr. Andree Elk requested that a bladder scan be preformed to make sure pt was not retaining urine. Bladder scan revealed 19mL urine. Dr. Andree Elk stated pt ok to go to her room 348 now.

## 2019-05-12 NOTE — Anesthesia Post-op Follow-up Note (Signed)
Anesthesia QCDR form completed.        

## 2019-05-12 NOTE — Anesthesia Procedure Notes (Addendum)
Procedure Name: Intubation Date/Time: 05/12/2019 7:43 AM Performed by: Debe Coder, CRNA Pre-anesthesia Checklist: Patient identified, Emergency Drugs available, Suction available and Patient being monitored Patient Re-evaluated:Patient Re-evaluated prior to induction Oxygen Delivery Method: Circle system utilized Preoxygenation: Pre-oxygenation with 100% oxygen Induction Type: IV induction Ventilation: Mask ventilation without difficulty Laryngoscope Size: Mac and 4 Grade View: Grade I Tube type: Oral Tube size: 7.0 mm Number of attempts: 1 Airway Equipment and Method: Stylet and Oral airway Placement Confirmation: ETT inserted through vocal cords under direct vision,  positive ETCO2 and breath sounds checked- equal and bilateral Tube secured with: Tape Dental Injury: Teeth and Oropharynx as per pre-operative assessment

## 2019-05-13 DIAGNOSIS — N920 Excessive and frequent menstruation with regular cycle: Secondary | ICD-10-CM | POA: Diagnosis not present

## 2019-05-13 LAB — BASIC METABOLIC PANEL
Anion gap: 7 (ref 5–15)
BUN: 13 mg/dL (ref 6–20)
CO2: 21 mmol/L — ABNORMAL LOW (ref 22–32)
Calcium: 9.4 mg/dL (ref 8.9–10.3)
Chloride: 109 mmol/L (ref 98–111)
Creatinine, Ser: 0.48 mg/dL (ref 0.44–1.00)
GFR calc Af Amer: >60 mL/min (ref >60)
GFR calc non Af Amer: >60 mL/min (ref >60)
Glucose, Bld: 130 mg/dL — ABNORMAL HIGH (ref 70–99)
Potassium: 4.4 mmol/L (ref 3.5–5.1)
Sodium: 137 mmol/L (ref 135–145)

## 2019-05-13 LAB — CBC
HCT: 42.1 % (ref 36.0–46.0)
Hemoglobin: 14 g/dL (ref 12.0–15.0)
MCH: 33.6 pg (ref 26.0–34.0)
MCHC: 33.3 g/dL (ref 30.0–36.0)
MCV: 101 fL — ABNORMAL HIGH (ref 80.0–100.0)
Platelets: 178 10*3/uL (ref 150–400)
RBC: 4.17 MIL/uL (ref 3.87–5.11)
RDW: 13 % (ref 11.5–15.5)
WBC: 16.6 10*3/uL — ABNORMAL HIGH (ref 4.0–10.5)
nRBC: 0 % (ref 0.0–0.2)

## 2019-05-13 LAB — SURGICAL PATHOLOGY

## 2019-05-13 MED ORDER — OXYCODONE-ACETAMINOPHEN 5-325 MG PO TABS: 1.0000 | ORAL_TABLET | Freq: Four times a day (QID) | ORAL | 0 refills | Status: DC | PRN

## 2019-05-13 MED ORDER — IBUPROFEN 600 MG PO TABS: 600.0000 mg | ORAL_TABLET | Freq: Four times a day (QID) | ORAL | 0 refills | Status: DC

## 2019-05-13 NOTE — Progress Notes (Signed)
Home Medications given to patient from pharmacy.  She took her own home meds that she routinely takes.  DC instr reviewed with pt.  Pt verbalized understanding and of need to make her f/u appt in 1 wk.  DC to home via wc by auxillary staff.

## 2019-05-13 NOTE — Discharge Summary (Signed)
DC Summary Discharge Summary   Patient ID: Rachel Wall [id number] 38 y.o. [birth year]  Admit date: 05/12/2019  Discharge date: 05/13/2019  Principal Diagnoses:  1) menorrhagia with irregular cycle 2) fibroid uterus  Secondary Diagnoses:  Pulmonary arterial hypertension  Procedures performed during the hospitalization:  Total laparoscopic hysterectomy, cystoscopy on 05/12/2019  HPI: 38 y.o.[id number] who has a long history of heavy, painful periods. She had an IUD for seven years that was removed just 5 months ago. Since that time she had one period that was "aweful" for her. See Rachel Wall's note from March weeks ago. She also had a pelvic ultrasound that showed an intramural, posterior fibroid that measured about 4 cm in each direction. She has never taken any form of pills. She bled frequently with her IUD. She had a lot of bleeding for the past year with her IUD, that had been in for 7 years. She has tried every form of birth control and nothing worked. She greatly desires a hysterectomy and has been cleared by her cardiologist and pulmonologist with some specific management recommendations.  She had a normal pap smear and negative endometrial biopsy pre-operatively.  Past Medical History:  Diagnosis Date  . 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 Depression   . GERD (gastroesophageal reflux disease)   . History of kidney stones   . Presence of permanent cardiac pacemaker   . Pulmonary hypertension ([place])   . Tachycardia     Past Surgical History:  Procedure Laterality Date  . CYSTOSCOPY  05/12/2019   Procedure: CYSTOSCOPY;  Surgeon: Rachel Bonnet, MD;  Location: [facility] ORS;  Service: Gynecology;;  . heart monitor implant and removal    . INSERT / REPLACE / REMOVE PACEMAKER    . LITHOTRIPSY  2012  . PACEMAKER INSERTION  2013  . STENT REMOVAL    . TOTAL LAPAROSCOPIC HYSTERECTOMY WITH SALPINGECTOMY Bilateral 05/12/2019   Procedure: TOTAL LAPAROSCOPIC  HYSTERECTOMY;  Surgeon: Rachel Bonnet, MD;  Location: [facility] ORS;  Service: Gynecology;  Laterality: Bilateral;  . TUBAL LIGATION  10/10/2011    Allergies  Allergen Reactions  . Cyclosporine Other (See Comments)    Medication interactions  . Nitroglycerin Other (See Comments)    Medication interactions  . Other Other (See Comments)    Muscle relaxers - per patient interacts with other medications she is on.  . Adhesive [Tape] Rash    Social History   Tobacco Use  . Smoking status: Current Every Day Smoker    Packs/day: 0.50    Types: Cigarettes  . Smokeless tobacco: Never Used  Substance Use Topics  . Alcohol use: Not Currently  . Drug use: Not Currently    Family History  Problem Relation Age of Onset  . Healthy Mother   . Lung disease Father   . Heart disease Father   . Diabetes Father   . Hypertension Father   . Kidney cancer Father   . Colon cancer Paternal [place] Hospital Course:  She was admitted for a total laparoscopic hysterectomy and cystoscopy on 05/12/2019, which occurred without incident.  She was monitored overnight for pain control and, due to her history of pulmonary arterial hypertension, postoperative cardiopulmonary monitoring.  She had normal O2 saturations during her stay (she had continuous monitoring). She was maintained on her home medications for pulmonary arterial hypertension.  On POD#1 she was ambulating, tolerating PO well, voiding spontaneously and with the appropriate amount, and had adequate pain control.  She had  normal labs and vital signs. She was, therefore, determined to be a good candidate for discharge.   Discharge Exam: BP 124/84   Pulse 77   Temp 97.6 F (36.4 C) (Oral)   Resp 20   LMP 04/14/2019 (Exact Date)   SpO2 98%  Physical Exam Vitals signs and nursing note reviewed.  Constitutional:      General: She is not in acute distress.    Appearance: Normal appearance.  HENT:     Head: Normocephalic and  atraumatic.  Eyes:     General: No scleral icterus.    Conjunctiva/sclera: Conjunctivae normal.  Cardiovascular:     Rate and Rhythm: Normal rate and regular rhythm.     Heart sounds: No murmur. No friction rub. No gallop.   Pulmonary:     Effort: Pulmonary effort is normal.     Breath sounds: Normal breath sounds. No wheezing, rhonchi or rales.  Abdominal:     Palpations: Abdomen is soft.     Tenderness: There is abdominal tenderness (appropriately tender to palpation). There is no guarding or rebound.     Comments: All incision sites clean, dry, and intact  Musculoskeletal: Normal range of motion.        General: No swelling.  Skin:    General: Skin is warm and dry.     Coloration: Skin is not jaundiced.  Neurological:     General: No focal deficit present.     Mental Status: She is alert and oriented to person, place, and time.     Cranial Nerves: No cranial nerve deficit.  Psychiatric:        Mood and Affect: Mood normal.        Behavior: Behavior normal.        Judgment: Judgment normal.    CBC Latest Ref Rng & Units 05/13/2019 05/08/2019  WBC 4.0 - 10.5 K/uL 16.6(H) 8.2  Hemoglobin 12.0 - 15.0 g/dL 14.0 15.0  Hematocrit 36.0 - 46.0 % 42.1 44.4  Platelets 150 - 400 K/uL 178 153     Lab Results  Component Value Date   NA 137 05/13/2019   NA 138 05/08/2019   K 4.4 05/13/2019   K 4.0 05/08/2019   CREATININE 0.48 05/13/2019   CREATININE 0.61 05/08/2019     Condition at Discharge: Stable  Complications affecting treatment: None  Discharge Medications:  Allergies as of 05/13/2019      Reactions   Cyclosporine Other (See Comments)   Medication interactions   Nitroglycerin Other (See Comments)   Medication interactions   Other Other (See Comments)   Muscle relaxers - per patient interacts with other medications she is on.   Adhesive [tape] Rash      Medication List    STOP taking these medications   norethindrone 5 MG tablet Commonly known as: AYGESTIN      TAKE these medications   citalopram 40 MG tablet Commonly known as: CELEXA Take 40 mg by mouth daily.   diltiazem 120 MG tablet Commonly known as: CARDIZEM Take 120-240 mg by mouth See admin instructions. Take 120 mg by mouth in the morning and take 240 mg by mouth at bedtime   ibuprofen 600 MG tablet Commonly known as: ADVIL Take 1 tablet (600 mg total) by mouth every 6 (six) hours.   Letairis 10 MG tablet Generic drug: ambrisentan Take 10 mg by mouth daily.   oxyCODONE-acetaminophen 5-325 MG tablet Commonly known as: PERCOCET/ROXICET Take 1 tablet by mouth every 6 (six) hours as needed (  breakthrough pain).   Pepcid AC Maximum Strength 20 MG tablet Generic drug: famotidine Take 40 mg by mouth 2 (two) times daily.   ProAir HFA 108 (90 Base) MCG/ACT inhaler Generic drug: albuterol Inhale 2 puffs into the lungs every 6 (six) hours as needed for wheezing or shortness of breath.   tadalafil 20 MG tablet Commonly known as: CIALIS Take 40 mg by mouth daily.   Uptravi 1200 MCG Tabs Generic drug: Selexipag Take 1,200 mcg by mouth 2 (two) times daily.       Follow-up arrangements:  Follow-up Information    Rachel Bonnet, MD. Schedule an appointment as soon as possible for a visit in 1 week.   Specialty: Obstetrics and Gynecology Why: For post-op incision check Contact information: [address] [place] [address] [phone]           Discharge Disposition: Home to self care  Signed: Prentice Docker, MD 05/13/2019 9:40 AM

## 2019-05-22 ENCOUNTER — Other Ambulatory Visit: Payer: Self-pay

## 2019-05-22 ENCOUNTER — Encounter: Payer: Self-pay | Admitting: Obstetrics and Gynecology

## 2019-05-22 ENCOUNTER — Ambulatory Visit (INDEPENDENT_AMBULATORY_CARE_PROVIDER_SITE_OTHER): Payer: 59 | Admitting: Obstetrics and Gynecology

## 2019-05-22 VITALS — BP 122/78 | Ht 68.0 in | Wt 213.0 lb

## 2019-05-22 DIAGNOSIS — Z09 Encounter for follow-up examination after completed treatment for conditions other than malignant neoplasm: Secondary | ICD-10-CM

## 2019-05-22 NOTE — Progress Notes (Signed)
   Postoperative Follow-up Patient presents post op from TLH/BS/Cysto 1 week ago for menorrhagia with irregular cycles, fibroid uterus.  Subjective: Patient reports signficant improvement in her preop symptoms. Eating a regular diet without difficulty. Pain is mostly on right side. Taking occasional ibuprofen for the pain, but states that it is barely controlled  Activity: increasing slowly.  States she isn't sleeping well.  Denies fevers, chills, nausea and emesis.  Has not pink spotting on tissue very occasionally when wiping after voiding. Denies hematuria, passage of bright red blood or clots vaginally.   Objective: Vitals:   05/22/19 1627  BP: 122/78   Vital Signs: BP 122/78   Ht 5\' 8"  (1.727 m)   Wt 213 lb (96.6 kg)   BMI 32.39 kg/m  Constitutional: Well nourished, well developed female in no acute distress.  HEENT: normal Skin: Warm and dry.  Extremity:  no edema  Abdomen: Soft, non-tender, normal bowel sounds; no bruits, organomegaly or masses. clean, dry, intact and without erythema, induration, warmth, mild ttp on LLQ incision  Assessment: 38 y.o. s/p TLH/BS/Cysto progressing well  Plan: Patient has done well after surgery with no apparent complications.  I have discussed the post-operative course to date, and the expected progress moving forward.  The patient understands what complications to be concerned about.  I will see the patient in routine follow up, or sooner if needed.    Activity plan: increase slowly.  Precautions for vaginal bleeding, worsening pain, fever, chills, or any symptoms of concern.  Return in about 5 weeks (around 06/26/2019) for Six week post op visit.   Prentice Docker, MD 05/22/2019, 4:56 PM

## 2019-06-10 ENCOUNTER — Encounter: Payer: Self-pay | Admitting: Obstetrics and Gynecology

## 2019-06-10 ENCOUNTER — Other Ambulatory Visit: Payer: Self-pay

## 2019-06-10 ENCOUNTER — Ambulatory Visit (INDEPENDENT_AMBULATORY_CARE_PROVIDER_SITE_OTHER): Payer: 59 | Admitting: Obstetrics and Gynecology

## 2019-06-10 VITALS — BP 111/72 | HR 86 | Ht 69.0 in | Wt 216.0 lb

## 2019-06-10 DIAGNOSIS — N760 Acute vaginitis: Secondary | ICD-10-CM

## 2019-06-10 DIAGNOSIS — B9689 Other specified bacterial agents as the cause of diseases classified elsewhere: Secondary | ICD-10-CM

## 2019-06-10 MED ORDER — METRONIDAZOLE 500 MG PO TABS: 500.0000 mg | ORAL_TABLET | Freq: Two times a day (BID) | ORAL | 0 refills | Status: AC

## 2019-06-10 NOTE — Progress Notes (Signed)
   Postoperative Follow-up Patient presents post op from TLH/BS/Cysto 4 weeks ago for menorrhagia with irregular cycles, fibroid uterus.  Subjective: Patient reports marked improvement in her preop symptoms. Eating a regular diet without difficulty. The patient is not having any pain.  Activity: increasing slowly. She does not a clear discharge for the past few days, that occasionally is tinted pink. She has had no frank blood. She also has vaginal and vulvar burning and itching.  She denies fevers, chills, nausea, and vomiting. Denies any issues with her incisions.  Objective: Vitals:   06/10/19 1603  BP: 111/72  Pulse: 86   Vital Signs: BP 111/72 (BP Location: Left Arm, Patient Position: Sitting, Cuff Size: Large)   Pulse 86   Ht 5\' 9"  (1.753 m)   Wt 216 lb (98 kg)   BMI 31.90 kg/m  Constitutional: Well nourished, well developed female in no acute distress.  HEENT: normal Skin: Warm and dry.  Extremity: no edema  Abdomen: Soft, non-tender, normal bowel sounds; no bruits, organomegaly or masses. clean, dry, intact and incisions without erythema, induration, warmthm, and tenderness  Pelvic exam: (female chaperone present) is not limited by body habitus EGBUS: within normal limits Vagina: within normal limits and without blood in the vault Cervical cuff is closed without induration, warmth, tenderness, erythema  Wet Prep: PH: 5.0 Clue Cells: Positive Fungal elements: Negative Trichomonas: Negative     Assessment: 38 y.o. s/p TLH/BS/Cysto with bacterial vaginosis  Plan: Treat BV.  No sex for 12 weeks post-op.   Keep previously schedule PO appt.   Reviewed wound care instructions.   Prentice Docker, MD 06/10/2019, 4:24 PM

## 2019-06-26 ENCOUNTER — Other Ambulatory Visit: Payer: Self-pay

## 2019-06-26 ENCOUNTER — Ambulatory Visit (INDEPENDENT_AMBULATORY_CARE_PROVIDER_SITE_OTHER): Payer: 59 | Admitting: Obstetrics and Gynecology

## 2019-06-26 ENCOUNTER — Encounter: Payer: Self-pay | Admitting: Obstetrics and Gynecology

## 2019-06-26 VITALS — BP 100/70 | Ht 68.0 in | Wt 211.0 lb

## 2019-06-26 DIAGNOSIS — Z09 Encounter for follow-up examination after completed treatment for conditions other than malignant neoplasm: Secondary | ICD-10-CM

## 2019-06-26 DIAGNOSIS — Z23 Encounter for immunization: Secondary | ICD-10-CM

## 2019-06-26 DIAGNOSIS — D251 Intramural leiomyoma of uterus: Secondary | ICD-10-CM

## 2019-06-26 DIAGNOSIS — N921 Excessive and frequent menstruation with irregular cycle: Secondary | ICD-10-CM

## 2019-06-26 NOTE — Progress Notes (Signed)
   Postoperative Follow-up Patient presents post op from TLH/BS/Cysto 6 week ago for menorrhagia with irregular cycle, fibroids.  Subjective: Patient reports marked improvement in her preop symptoms. Eating a regular diet without difficulty. The patient is not having any pain.  Activity: normal activities of daily living.  Objective: Vitals:   06/26/19 1604  BP: 100/70   Vital Signs: BP 100/70   Ht 5\' 8"  (1.727 m)   Wt 211 lb (95.7 kg)   LMP 04/14/2019 (Exact Date)   BMI 32.08 kg/m  Constitutional: Well nourished, well developed female in no acute distress.  HEENT: normal Skin: Warm and dry.  Extremity: no edema  Abdomen: Soft, non-tender, normal bowel sounds; no bruits, organomegaly or masses. clean, dry, intact and without erythema, induration, warmth, or tenderness  Pelvic exam: (female chaperone present) is not limited by body habitus EGBUS: within normal limits Vagina: within normal limits and without blood in the vault Vaginal cuff: clean, dry, intact without erythema, induration, warmth, or tenderness    Assessment: 38 y.o. s/p TLH/BS progressing well  Plan: Patient has done well after surgery with no apparent complications.  I have discussed the post-operative course to date, and the expected progress moving forward.  The patient understands what complications to be concerned about.  I will see the patient in routine follow up, or sooner if needed.    Activity plan: no intercourse until 8-12 weeks postop  Prentice Docker, MD 06/26/2019, 4:12 PM

## 2019-07-13 ENCOUNTER — Ambulatory Visit: Payer: 59 | Admitting: Obstetrics and Gynecology

## 2019-09-20 ENCOUNTER — Encounter: Payer: Self-pay | Admitting: Emergency Medicine

## 2019-09-20 ENCOUNTER — Other Ambulatory Visit: Payer: Self-pay

## 2019-09-20 ENCOUNTER — Emergency Department
Admission: EM | Admit: 2019-09-20 | Discharge: 2019-09-20 | Disposition: A | Discharge status: Home / Self Care | Payer: 59 | Attending: Emergency Medicine | Admitting: Emergency Medicine

## 2019-09-20 DIAGNOSIS — J0110 Acute frontal sinusitis, unspecified: Secondary | ICD-10-CM | POA: Diagnosis not present

## 2019-09-20 DIAGNOSIS — Z87891 Personal history of nicotine dependence: Secondary | ICD-10-CM | POA: Diagnosis not present

## 2019-09-20 DIAGNOSIS — Z950 Presence of cardiac pacemaker: Secondary | ICD-10-CM | POA: Diagnosis not present

## 2019-09-20 DIAGNOSIS — H6122 Impacted cerumen, left ear: Secondary | ICD-10-CM | POA: Insufficient documentation

## 2019-09-20 DIAGNOSIS — Z79899 Other long term (current) drug therapy: Secondary | ICD-10-CM | POA: Insufficient documentation

## 2019-09-20 DIAGNOSIS — R519 Headache, unspecified: Secondary | ICD-10-CM | POA: Diagnosis present

## 2019-09-20 MED ORDER — AMOXICILLIN-POT CLAVULANATE 875-125 MG PO TABS: 1.0000 | ORAL_TABLET | Freq: Once | ORAL | Status: DC

## 2019-09-20 MED ORDER — AMOXICILLIN-POT CLAVULANATE 875-125 MG PO TABS
1.0000 | ORAL_TABLET | Freq: Once | ORAL | Status: AC
  Administered 2019-09-20: 1 via ORAL
  Filled 2019-09-20: qty 1

## 2019-09-20 MED ORDER — CETIRIZINE HCL 10 MG PO TABS: 10.0000 mg | ORAL_TABLET | Freq: Every day | ORAL | 0 refills | Status: DC

## 2019-09-20 MED ORDER — PREDNISONE 10 MG PO TABS: ORAL_TABLET | ORAL | 0 refills | Status: DC

## 2019-09-20 NOTE — ED Triage Notes (Signed)
Bilateral ear pain x 9 days, states she thinks there may be some cotton in ears.

## 2019-09-20 NOTE — ED Provider Notes (Signed)
[facility] Emergency Department Provider Note ____________________________________________  Time seen: 1750  I have reviewed the triage vital signs and the nursing notes.  HISTORY  Chief Complaint  Otalgia   HPI Rachel Wall is a 38 y.o. female presents to the clinic today with c/o acute headache, sinus pain and pressure, nasal congestion, ear pain and cough. This started 9 days ago but worsened in the last 3 days. The headaches is located in her forehead and cheeks. She describes the pain as pressure. She denies dizziness, visual changes, sensitivity to light and sound, nausea or vomiting. She is blowing blood tinged green mucous out of her nose. She describes the ear pain as fullness, "like cotton is stuck in my hears". She denies hearing loss. The cough is mostly nonproductive. She denies fever, chills or body aches. She has tried Tylenol OTC with minimal relief.  Past Medical History:  Diagnosis Date  . 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 Depression   . GERD (gastroesophageal reflux disease)   . History of kidney stones   . Presence of permanent cardiac pacemaker   . Pulmonary hypertension ([place])   . Tachycardia     Patient Active Problem List   Diagnosis Date Noted  . Fibroid uterus 05/12/2019  . Pulmonary arterial hypertension ([place]) 05/12/2019  . Status post laparoscopic hysterectomy 05/12/2019  . Menorrhagia with irregular cycle 12/19/2018  . Tobacco use 11/20/2018    Past Surgical History:  Procedure Laterality Date  . CYSTOSCOPY  05/12/2019   Procedure: CYSTOSCOPY;  Surgeon: Will Bonnet, MD;  Location: [facility] ORS;  Service: Gynecology;;  . heart monitor implant and removal    . INSERT / REPLACE / REMOVE PACEMAKER    . LITHOTRIPSY  2012  . PACEMAKER INSERTION  2013  . STENT REMOVAL    . TOTAL LAPAROSCOPIC HYSTERECTOMY WITH SALPINGECTOMY Bilateral 05/12/2019   Procedure: TOTAL LAPAROSCOPIC HYSTERECTOMY;  Surgeon: Will Bonnet,  MD;  Location: [facility] ORS;  Service: Gynecology;  Laterality: Bilateral;  . TUBAL LIGATION  10/10/2011    Prior to Admission medications   Medication Sig Start Date End Date Taking? Authorizing Provider  albuterol (PROAIR HFA) 108 (90 Base) MCG/ACT inhaler Inhale 2 puffs into the lungs every 6 (six) hours as needed for wheezing or shortness of breath.  11/10/18   [provider]  ambrisentan (LETAIRIS) 10 MG tablet Take 10 mg by mouth daily.     [provider]  cetirizine (ZYRTEC ALLERGY) 10 MG tablet Take 1 tablet (10 mg total) by mouth daily. 09/20/19 09/19/20  Jearld Fenton, NP  citalopram (CELEXA) 40 MG tablet Take 40 mg by mouth daily.    [provider]  diltiazem (CARDIZEM) 120 MG tablet Take 120-240 mg by mouth See admin instructions. Take 120 mg by mouth in the morning and take 240 mg by mouth at bedtime    [provider]  famotidine (PEPCID AC MAXIMUM STRENGTH) 20 MG tablet Take 40 mg by mouth 2 (two) times daily.    [provider]  predniSONE (DELTASONE) 10 MG tablet Take 6 tabs day 1, 5 tabs day 2, 4 tabs day 3, 3 tabs day 4, 2 tabs day 5, 1 tab day 6 09/20/19   Myangel Summons, Coralie Keens, NP  Selexipag (UPTRAVI) 1200 MCG TABS Take 1,200 mcg by mouth 2 (two) times daily.     [provider]  tadalafil, PAH, (ADCIRCA) 20 MG tablet  06/04/19   [provider]  Allergies Cyclosporine, Nitroglycerin, Other, and Adhesive [tape]  Family History  Problem Relation Age of Onset  . Healthy Mother   . Lung disease Father   . Heart disease Father   . Diabetes Father   . Hypertension Father   . Kidney cancer Father   . Colon cancer Paternal Grandfather     Social History Social History   Tobacco Use  . Smoking status: Former Smoker    Packs/day: 0.50    Types: Cigarettes  . Smokeless tobacco: Never Used  Substance Use Topics  . Alcohol use: Not Currently  . Drug use: Not Currently    Review of Systems  Constitutional:  Negative for fever, chills or body aches. Eyes: Negative for visual changes, sensitivity to light, eye redness, pain or discharge.. ENT: Positive for ear pain, nasal congestion and sinus pressure. Negative for runny nose, loss of hearing or sore throat. Cardiovascular: Negative for chest pain or chest tightness. Respiratory: Positive for cough. Negative for shortness of breath. Gastrointestinal: Negative for nausea, vomiting and diarrhea. Skin: Negative for rash. Neurological: Positive for headache. Negative for focal weakness, tingling or numbness. ____________________________________________  PHYSICAL EXAM:  VITAL SIGNS: ED Triage Vitals  Enc Vitals Group     BP 09/20/19 1734 98/72     Pulse Rate 09/20/19 1734 85     Resp 09/20/19 1734 18     Temp 09/20/19 1734 98.7 F (37.1 C)     Temp Source 09/20/19 1734 Oral     SpO2 09/20/19 1734 99 %     Weight 09/20/19 1733 210 lb (95.3 kg)     Height 09/20/19 1733 5\' 8"  (1.727 m)     Head Circumference --      Peak Flow --      Pain Score 09/20/19 1733 4     Pain Loc --      Pain Edu? --      Excl. in [place]? --     Constitutional: Alert and oriented. Obese, in no distress. Head: Normocephalic and atraumatic. Pain with palpation over the frontal sinuses. Eyes: Conjunctivae are normal. PERRL. Normal extraocular movements Right Ear: Canal clear, TM with serous effusion. Left Ear: Deep cerumen impaction, unable to visualize the TM. Nose: No congestion/rhinorrhea/epistaxis. Mouth/Throat: Mucous membranes are moist. No posterior erythema or exudate noted. Hematological/Lymphatic/Immunological: No cervical lymphadenopathy. Cardiovascular: Normal rate, regular rhythm.  Respiratory: Normal respiratory effort. No wheezes/rales/rhonchi. Neurologic:  Normal gait without ataxia. Normal speech and language. No gross focal neurologic deficits are appreciated. Skin:  Skin is warm, dry and intact. No rash  noted. ____________________________________________  INITIAL IMPRESSION / ASSESSMENT AND PLAN / ED COURSE  Acute Frontal Sinusitis, Left Cerumen Impaction:  RX for Pred Taper x 6 days RX for Augmentin 875-125 mg PO x 10 days RX for Zyrtec 10 mg PO QHS x 1 week She declines ear lavage in the ER Advised her to try Debrox OTC 4 drops BID x 4 days and flush on day 5  ____________________________________________  FINAL CLINICAL IMPRESSION(S) / ED DIAGNOSES  Final diagnoses:  Acute non-recurrent frontal sinusitis  Left ear impacted cerumen   Webb Silversmith, NP    Jearld Fenton, NP 09/20/19 Johnnye Lana    Carrie Mew, MD 09/20/19 2325

## 2019-09-20 NOTE — ED Notes (Signed)
Pt states she feels like she has a sinus infection d/t sinus pressure and phlegm/nasal drainage. States she thinks she has cotton in her left ear because she put drops in her ear then put cotton in her ear. C/o pain and itching in both ears.

## 2019-09-20 NOTE — Discharge Instructions (Addendum)
You were seen today for a sinus infection. I have given you a RX for steroids, antibiotics and an antihistamine- take as directed. You can try Debrox OTC 4 drops left ear 2 x day for 4 days, then flush with warm water and bulb syringe on day 5. Follow up if symptoms persist or worsen.

## 2019-12-27 ENCOUNTER — Other Ambulatory Visit: Payer: Self-pay

## 2019-12-27 DIAGNOSIS — Z87891 Personal history of nicotine dependence: Secondary | ICD-10-CM | POA: Insufficient documentation

## 2019-12-27 DIAGNOSIS — M5431 Sciatica, right side: Secondary | ICD-10-CM | POA: Diagnosis not present

## 2019-12-27 DIAGNOSIS — M79604 Pain in right leg: Secondary | ICD-10-CM | POA: Insufficient documentation

## 2019-12-27 DIAGNOSIS — M545 Low back pain: Secondary | ICD-10-CM | POA: Diagnosis present

## 2019-12-27 DIAGNOSIS — Z950 Presence of cardiac pacemaker: Secondary | ICD-10-CM | POA: Insufficient documentation

## 2019-12-27 DIAGNOSIS — Z79899 Other long term (current) drug therapy: Secondary | ICD-10-CM | POA: Insufficient documentation

## 2019-12-27 NOTE — ED Triage Notes (Signed)
Pt ambulatory to triage with no difficulty. Reports pain to her right lower back for about 3 days. Pain radiates down her leg.

## 2019-12-28 ENCOUNTER — Emergency Department: Payer: 59

## 2019-12-28 ENCOUNTER — Emergency Department
Admission: EM | Admit: 2019-12-28 | Discharge: 2019-12-28 | Disposition: A | Discharge status: Home / Self Care | Payer: 59 | Attending: Emergency Medicine | Admitting: Emergency Medicine

## 2019-12-28 DIAGNOSIS — M5431 Sciatica, right side: Secondary | ICD-10-CM | POA: Diagnosis not present

## 2019-12-28 LAB — URINALYSIS, COMPLETE (UACMP) WITH MICROSCOPIC
Bilirubin Urine: NEGATIVE
Glucose, UA: NEGATIVE mg/dL
Hgb urine dipstick: NEGATIVE
Ketones, ur: NEGATIVE mg/dL
Leukocytes,Ua: NEGATIVE
Nitrite: NEGATIVE
Protein, ur: NEGATIVE mg/dL
Specific Gravity, Urine: 1.017 (ref 1.005–1.030)
pH: 6 (ref 5.0–8.0)

## 2019-12-28 LAB — PREGNANCY, URINE: Preg Test, Ur: NEGATIVE

## 2019-12-28 MED ORDER — KETOROLAC TROMETHAMINE 60 MG/2ML IM SOLN
60.0000 mg | Freq: Once | INTRAMUSCULAR | Status: AC
  Administered 2019-12-28: 60 mg via INTRAMUSCULAR
  Filled 2019-12-28: qty 2

## 2019-12-28 MED ORDER — NAPROXEN 500 MG PO TABS: 500.0000 mg | ORAL_TABLET | Freq: Two times a day (BID) | ORAL | 0 refills | Status: DC

## 2019-12-28 NOTE — Discharge Instructions (Signed)
You have been seen in the Emergency Department (ED)  today for back pain.  Back pain has many possible causes some are related to muscles while others have more serious causes. Even though you were checked carefully today and your exam and evaluation were reassuring, problems may develop later or continue to unfold. Therefore it is imperative that you follow up with doctor closely for further evaluation.  Follow-up with your doctor in 1 day for further evaluation.  For pain control take: naprosyn 500mg  twice a day with meals  When should you call for help?  Call your doctor now or seek immediate medical care if:  You have new or worsening numbness in your legs.  You have new or worsening weakness in your legs. (This could make it hard to stand up.)  You lose control of your bladder or bowels or if you are unable to urinate. You have numbness of your groin or buttock region If you develop a fever  Watch closely for changes in your health, and be sure to contact your doctor if:  Your pain gets worse.  You are not getting better after 2 weeks.  How can you care for yourself at home?  Take pain medicines exactly as directed.  If the doctor gave you a prescription medicine for pain, take it as prescribed.  If you are not taking a prescription pain medicine, ask your doctor if you can take an over-the-counter medicine like tylenol or ibuprofen. Sit or lie in positions that are most comfortable and reduce your pain. Try one of these positions when you lie down:  Lie on your back with your knees bent and supported by large pillows.  Lie on the floor with your legs on the seat of a sofa or chair.  Lie on your side with your knees and hips bent and a pillow between your legs.  Lie on your stomach if it does not make pain worse. Do not sit up in bed, and avoid soft couches and twisted positions. Bed rest can help relieve pain at first, but it delays healing. Avoid bed rest after the first day of back  pain.  Change positions every 30 minutes. If you must sit for long periods of time, take breaks from sitting. Get up and walk around, or lie in a comfortable position.  Try using a heating pad on a low or medium setting for 15 to 20 minutes every 2 or 3 hours. Try a warm shower in place of one session with the heating pad.  You can also try an ice pack for 10 to 15 minutes every 2 to 3 hours. Put a thin cloth between the ice pack and your skin.  Take short walks several times a day. You can start with 5 to 10 minutes, 3 or 4 times a day, and work up to longer walks. Walk on level surfaces and avoid hills and stairs until your back is better.  Return to work and other activities as soon as you can. Continued rest without activity is usually not good for your back.  To prevent future back pain, do exercises to stretch and strengthen your back and stomach. Learn how to use good posture, safe lifting techniques, and proper body mechanics.

## 2019-12-28 NOTE — ED Provider Notes (Signed)
[facility] Emergency Department Provider Note  ____________________________________________  Time seen: Approximately 1:28 AM  I have reviewed the triage vital signs and the nursing notes.   HISTORY  Chief Complaint Back Pain   HPI Rachel Wall is a 39 y.o. female with a history of symptomatic bradycardia status post pacemaker, pulmonary hypertension, kidney stones who presents for evaluation of back and leg pain.  Patient reports her symptoms started 3 days ago.  The pain is located in her right buttock and radiating down to her leg, shocklike, constant and becoming progressively worse.  She tried heat pads at home with no significant relief.  No midline back pain, no trauma, she does not remember any provoking incident when the pain started 3 days ago.  No saddle anesthesia, lower extremity weakness or numbness, urinary or bowel incontinence or retention.  No abdominal pain, no dysuria or hematuria, no fever or chills.   Past Medical History:  Diagnosis Date  . 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 Depression   . GERD (gastroesophageal reflux disease)   . History of kidney stones   . Presence of permanent cardiac pacemaker   . Pulmonary hypertension ([place])   . Tachycardia     Patient Active Problem List   Diagnosis Date Noted  . Fibroid uterus 05/12/2019  . Pulmonary arterial hypertension ([place]) 05/12/2019  . Status post laparoscopic hysterectomy 05/12/2019  . Menorrhagia with irregular cycle 12/19/2018  . Tobacco use 11/20/2018    Past Surgical History:  Procedure Laterality Date  . CYSTOSCOPY  05/12/2019   Procedure: CYSTOSCOPY;  Surgeon: Will Bonnet, MD;  Location: [facility] ORS;  Service: Gynecology;;  . heart monitor implant and removal    . INSERT / REPLACE / REMOVE PACEMAKER    . LITHOTRIPSY  2012  . PACEMAKER INSERTION  2013  . STENT REMOVAL    . TOTAL LAPAROSCOPIC HYSTERECTOMY WITH SALPINGECTOMY Bilateral 05/12/2019   Procedure:  TOTAL LAPAROSCOPIC HYSTERECTOMY;  Surgeon: Will Bonnet, MD;  Location: [facility] ORS;  Service: Gynecology;  Laterality: Bilateral;  . TUBAL LIGATION  10/10/2011    Prior to Admission medications   Medication Sig Start Date End Date Taking? Authorizing Provider  albuterol (PROAIR HFA) 108 (90 Base) MCG/ACT inhaler Inhale 2 puffs into the lungs every 6 (six) hours as needed for wheezing or shortness of breath.  11/10/18   [provider]  ambrisentan (LETAIRIS) 10 MG tablet Take 10 mg by mouth daily.     [provider]  cetirizine (ZYRTEC ALLERGY) 10 MG tablet Take 1 tablet (10 mg total) by mouth daily. 09/20/19 09/19/20  Jearld Fenton, NP  citalopram (CELEXA) 40 MG tablet Take 40 mg by mouth daily.    [provider]  diltiazem (CARDIZEM) 120 MG tablet Take 120-240 mg by mouth See admin instructions. Take 120 mg by mouth in the morning and take 240 mg by mouth at bedtime    [provider]  famotidine (PEPCID AC MAXIMUM STRENGTH) 20 MG tablet Take 40 mg by mouth 2 (two) times daily.    [provider]  naproxen (NAPROSYN) 500 MG tablet Take 1 tablet (500 mg total) by mouth 2 (two) times daily with a meal. 12/28/19 12/27/20  Alfred Levins, [place], MD  predniSONE (DELTASONE) 10 MG tablet Take 6 tabs day 1, 5 tabs day 2, 4 tabs day 3, 3 tabs day 4, 2 tabs day 5, 1 tab day 6 09/20/19   Baity, Coralie Keens, NP  Selexipag (UPTRAVI)  1200 MCG TABS Take 1,200 mcg by mouth 2 (two) times daily.     [provider]  tadalafil, PAH, (ADCIRCA) 20 MG tablet  06/04/19   [provider]    Allergies Cyclosporine, Nitroglycerin, Other, and Adhesive [tape]  Family History  Problem Relation Age of Onset  . Healthy Mother   . Lung disease Father   . Heart disease Father   . Diabetes Father   . Hypertension Father   . Kidney cancer Father   . Colon cancer Paternal Grandfather     Social History Social History   Tobacco Use  . Smoking status:  Former Smoker    Packs/day: 0.50    Types: Cigarettes  . Smokeless tobacco: Never Used  Substance Use Topics  . Alcohol use: Not Currently  . Drug use: Not Currently    Review of Systems  Constitutional: Negative for fever. Eyes: Negative for visual changes. ENT: Negative for sore throat. Neck: No neck pain  Cardiovascular: Negative for chest pain. Respiratory: Negative for shortness of breath. Gastrointestinal: Negative for abdominal pain, vomiting or diarrhea. Genitourinary: Negative for dysuria. Musculoskeletal: + back pain. Skin: Negative for rash. Neurological: Negative for headaches, weakness or numbness. Psych: No SI or HI  ____________________________________________   PHYSICAL EXAM:  VITAL SIGNS: ED Triage Vitals  Enc Vitals Group     BP 12/27/19 2231 (!) 144/107     Pulse Rate 12/27/19 2231 85     Resp 12/27/19 2231 18     Temp 12/27/19 2231 98.1 F (36.7 C)     Temp Source 12/27/19 2231 Oral     SpO2 12/27/19 2231 97 %     Weight 12/27/19 2226 220 lb (99.8 kg)     Height 12/27/19 2226 5\' 8"  (1.727 m)     Head Circumference --      Peak Flow --      Pain Score 12/27/19 2225 8     Pain Loc --      Pain Edu? --      Excl. in [place]? --     Constitutional: Alert and oriented. Well appearing and in no apparent distress. HEENT:      Head: Normocephalic and atraumatic.         Eyes: Conjunctivae are normal. Sclera is non-icteric.       Mouth/Throat: Mucous membranes are moist.       Neck: Supple with no signs of meningismus. Cardiovascular: Regular rate and rhythm. No murmurs, gallops, or rubs. Respiratory: Normal respiratory effort. Lungs are clear to auscultation bilaterally. No wheezes, crackles, or rhonchi.  Gastrointestinal: Soft, non tender, and non distended  Musculoskeletal: No CT and L-spine tenderness.  Tenderness to palpation on the right sciatic notch.  Lower extremities are warm and well-perfused with 2+ DP and PT pulses.   Neurologic: Normal  speech and language. Face is symmetric.  Intact strength and sensation bilateral lower extremities.  2+ DTRs. Skin: Skin is warm, dry and intact. No rash noted. Psychiatric: Mood and affect are normal. Speech and behavior are normal.  ____________________________________________   LABS (all labs ordered are listed, but only abnormal results are displayed)  Labs Reviewed  URINALYSIS, COMPLETE (UACMP) WITH MICROSCOPIC - Abnormal; Notable for the following components:      Result Value   Color, Urine YELLOW (*)    APPearance HAZY (*)    Bacteria, UA RARE (*)    All other components within normal limits  PREGNANCY, URINE   ____________________________________________  EKG  none  ____________________________________________  RADIOLOGY  I have personally reviewed the images performed during this visit and I agree with the Radiologist's read.   Interpretation by Radiologist:  DG Lumbar Spine Complete  Result Date: 12/28/2019 CLINICAL DATA:  Right lower back pain x3 days EXAM: LUMBAR SPINE - COMPLETE 4+ VIEW COMPARISON:  None. FINDINGS: Five lumbar-type vertebral bodies. Normal lumbar lordosis. No evidence of fracture or dislocation. 2 body heights are maintained. Mild degenerative changes at L5-S1. Visualized bony pelvis appears intact. IMPRESSION: Negative. Electronically Signed   By: Julian Hy M.D.   On: 12/28/2019 00:52     ____________________________________________   PROCEDURES  Procedure(s) performed: None Procedures Critical Care performed:  None ____________________________________________   INITIAL IMPRESSION / ASSESSMENT AND PLAN / ED COURSE  39 y.o. female with a history of symptomatic bradycardia status post pacemaker, pulmonary hypertension, kidney stones who presents for evaluation of back and leg pain x 3 days.  Pain is reproducible with palpation of the right sciatic notch.  Patient is neurologically intact with no signs or symptoms of cauda equina.   No abdominal pain or tenderness.  Lumbar x-ray negative for compression fracture does show mild degenerative changes of L5-S1.  Urinalysis negative for blood or UTI. Pregnancy test negative.  Presentation concerning for sciatica pain.  Patient given a shot of Toradol and will discharge home on naproxen.  Follow-up with [facility] if symptoms are not improving.  Discussed return precautions for any signs of cauda equina, fever, or abdominal pain.  Pain improved after an IM shot of Toradol.  Patient ambulatory upon discharge.  I have reviewed patient's previous medical records and PMH.      _____________________________________________ Please note:  Patient was evaluated in Emergency Department today for the symptoms described in the history of present illness. Patient was evaluated in the context of the global COVID-19 pandemic, which necessitated consideration that the patient might be at risk for infection with the SARS-CoV-2 virus that causes COVID-19. Institutional protocols and algorithms that pertain to the evaluation of patients at risk for COVID-19 are in a state of rapid change based on information released by regulatory bodies including the CDC and federal and state organizations. These policies and algorithms were followed during the patient's care in the ED.  Some ED evaluations and interventions may be delayed as a result of limited staffing during the pandemic.   ____________________________________________   FINAL CLINICAL IMPRESSION(S) / ED DIAGNOSES   Final diagnoses:  Sciatica of right side      NEW MEDICATIONS STARTED DURING THIS VISIT:  ED Discharge Orders         Ordered    naproxen (NAPROSYN) 500 MG tablet  2 times daily with meals     12/28/19 0127           Note:  This document was prepared using Dragon voice recognition software and may include unintentional dictation errors.    Rudene Re, MD 12/28/19 [phone]

## 2019-12-28 NOTE — ED Notes (Signed)
Pt taken to imaging at this time.

## 2020-01-28 ENCOUNTER — Ambulatory Visit (INDEPENDENT_AMBULATORY_CARE_PROVIDER_SITE_OTHER): Payer: 59 | Admitting: Obstetrics and Gynecology

## 2020-01-28 ENCOUNTER — Other Ambulatory Visit: Payer: Self-pay

## 2020-01-28 ENCOUNTER — Encounter: Payer: Self-pay | Admitting: Obstetrics and Gynecology

## 2020-01-28 VITALS — BP 102/71 | HR 83 | Ht 68.0 in | Wt 229.0 lb

## 2020-01-28 DIAGNOSIS — R232 Flushing: Secondary | ICD-10-CM | POA: Diagnosis not present

## 2020-01-28 DIAGNOSIS — G4709 Other insomnia: Secondary | ICD-10-CM

## 2020-01-28 DIAGNOSIS — R635 Abnormal weight gain: Secondary | ICD-10-CM | POA: Diagnosis not present

## 2020-01-28 MED ORDER — TRAZODONE HCL 50 MG PO TABS: 50.0000 mg | ORAL_TABLET | Freq: Every day | ORAL | 0 refills | Status: DC

## 2020-01-28 NOTE — Progress Notes (Signed)
Obstetrics & Gynecology Office Visit   Chief Complaint  Patient presents with  . Follow-up    vasomotor S&S   History of Present Illness: 39 y.o. Rachel Wall female who is 8 months post-op from a [place] for menorrhagia with irregular cycles and fibroid uterus.  She is fatigued all the time.  She is having hot flashes, facial flushing (so bad that it turns almost purple).  This makes her sick and she gets a headache.  She is also not sleeping. It takes her a little while to fall asleep.  Once she is asleep she wakes up nearly every hour. Then it takes her a while to get back to sleep.  The thing that bothers her the most is the flushing and her lack of energy.  She has had no medical or life changes that would explain her symptoms. She has tried melatonin and Sleep Aid to sleep without much success.  She has also gained weight (about 20 pounds in about 8-9 weeks). She doesn't think she has had any skin or hair changes. She has diarrhea all the time (this is long-term with the medication that she has to take).  She says she had her thyroid checked multiple times (not since she moved her 1.5-2 years ago).  This has all been going on since her surgery, but has gotten worse.     Past Medical History:  Diagnosis Date  . 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 Depression   . GERD (gastroesophageal reflux disease)   . History of kidney stones   . Presence of permanent cardiac pacemaker   . Pulmonary hypertension ([place])   . Tachycardia     Past Surgical History:  Procedure Laterality Date  . CYSTOSCOPY  05/12/2019   Procedure: CYSTOSCOPY;  Surgeon: Will Bonnet, MD;  Location: [facility] ORS;  Service: Gynecology;;  . heart monitor implant and removal    . INSERT / REPLACE / REMOVE PACEMAKER    . LITHOTRIPSY  2012  . PACEMAKER INSERTION  2013  . STENT REMOVAL    . TOTAL LAPAROSCOPIC HYSTERECTOMY WITH SALPINGECTOMY Bilateral 05/12/2019   Procedure: TOTAL LAPAROSCOPIC HYSTERECTOMY;  Surgeon: Will Bonnet, MD;  Location: [facility] ORS;  Service: Gynecology;  Laterality: Bilateral;  . TUBAL LIGATION  10/10/2011    Gynecologic History: Patient's last menstrual period was 04/14/2019 (exact date).  Obstetric History: Rachel Wall  Family History  Problem Relation Age of Onset  . Healthy Mother   . Lung disease Father   . Heart disease Father   . Diabetes Father   . Hypertension Father   . Kidney cancer Father   . Colon cancer Paternal Grandfather     Social History   Socioeconomic History  . Marital status: Married    Spouse name: Not on file  . Number of children: Not on file  . Years of education: Not on file  . Highest education level: Not on file  Occupational History  . Not on file  Tobacco Use  . Smoking status: Former Smoker    Packs/day: 0.50    Types: Cigarettes  . Smokeless tobacco: Never Used  Substance and Sexual Activity  . Alcohol use: Not Currently  . Drug use: Not Currently  . Sexual activity: Yes    Birth control/protection: Surgical    Comment: Tubal ligation/Hysterectomy  Other Topics Concern  . Not on file  Social History Narrative  . Not on file   Social Determinants of Health   Financial Resource Strain:   .  Difficulty of Paying Living Expenses:   Food Insecurity:   . Worried About Charity fundraiser in the Last Year:   . Arboriculturist in the Last Year:   Transportation Needs:   . Film/video editor (Medical):   Marland Kitchen Lack of Transportation (Non-Medical):   Physical Activity:   . Days of Exercise per Week:   . Minutes of Exercise per Session:   Stress:   . Feeling of Stress :   Social Connections:   . Frequency of Communication with Friends and Family:   . Frequency of Social Gatherings with Friends and Family:   . Attends Religious Services:   . Active Member of Clubs or Organizations:   . Attends Archivist Meetings:   Marland Kitchen Marital Status:   Intimate Partner Violence:   . Fear of Current or Ex-Partner:   . Emotionally Abused:     Marland Kitchen Physically Abused:   . Sexually Abused:     Allergies  Allergen Reactions  . Cyclosporine Other (See Comments)    Medication interactions  . Nitroglycerin Other (See Comments)    Medication interactions  . Other Other (See Comments)    Muscle relaxers - per patient interacts with other medications she is on.  . Adhesive [Tape] Rash    Prior to Admission medications   Medication Sig Start Date End Date Taking? Authorizing Provider  albuterol (PROAIR HFA) 108 (90 Base) MCG/ACT inhaler Inhale 2 puffs into the lungs every 6 (six) hours as needed for wheezing or shortness of breath.  11/10/18  Yes [provider]  ambrisentan (LETAIRIS) 10 MG tablet Take 10 mg by mouth daily.    Yes [provider]  citalopram (CELEXA) 40 MG tablet Take 40 mg by mouth daily.   Yes [provider]  diltiazem (CARDIZEM) 120 MG tablet Take 120-240 mg by mouth See admin instructions. Take 120 mg by mouth in the morning and take 240 mg by mouth at bedtime   Yes [provider]  famotidine (PEPCID AC MAXIMUM STRENGTH) 20 MG tablet Take 40 mg by mouth 2 (two) times daily.   Yes [provider]  Selexipag (UPTRAVI) 1200 MCG TABS Take 1,200 mcg by mouth 2 (two) times daily.    Yes [provider]  tadalafil, PAH, (ADCIRCA) 20 MG tablet  06/04/19  Yes [provider]    Review of Systems  Constitutional: Negative.   HENT: Negative.   Eyes: Negative.   Respiratory: Negative.   Cardiovascular: Negative.   Gastrointestinal: Negative.   Genitourinary: Negative.   Musculoskeletal: Negative.   Skin: Negative.   Neurological: Negative.   Psychiatric/Behavioral: Negative.     Physical Exam BP 102/71   Pulse 83   Ht 5\' 8"  (1.727 m)   Wt 229 lb (103.9 kg)   LMP 04/14/2019 (Exact Date)   BMI 34.82 kg/m  Patient's last menstrual period was 04/14/2019 (exact date). Physical Exam Constitutional:      General: She is not in acute distress.     Appearance: Normal appearance. She is well-developed.  HENT:     Head: Normocephalic and atraumatic.  Eyes:     General: No scleral icterus.    Conjunctiva/sclera: Conjunctivae normal.  Cardiovascular:     Rate and Rhythm: Normal rate and regular rhythm.     Heart sounds: No murmur. No friction rub. No gallop.   Pulmonary:     Effort: Pulmonary effort is normal. No respiratory distress.     Breath sounds: Normal  breath sounds. No wheezing or rales.  Abdominal:     General: Bowel sounds are normal. There is no distension.     Palpations: Abdomen is soft. There is no mass.     Tenderness: There is no abdominal tenderness. There is no guarding or rebound.  Musculoskeletal:        General: Normal range of motion.     Cervical back: Normal range of motion and neck supple.  Neurological:     General: No focal deficit present.     Mental Status: She is alert and oriented to person, place, and time.     Cranial Nerves: No cranial nerve deficit.  Skin:    General: Skin is warm and dry.     Findings: No erythema.  Psychiatric:        Mood and Affect: Mood normal.        Behavior: Behavior normal.        Judgment: Judgment normal.     Female chaperone present for pelvic and breast  portions of the physical exam  Assessment: 39 y.o. Rachel Wall female here for  1. Other insomnia   2. Hot flashes   3. Facial flushing   4. Weight gain      Plan: Problem List Items Addressed This Visit    None    Visit Diagnoses    Other insomnia    -  Primary   Relevant Medications   traZODone (DESYREL) 50 MG tablet   Other Relevant Orders   TSH + free T4   Hot flashes       Relevant Orders   Follicle stimulating hormone   Facial flushing       Relevant Orders   Follicle stimulating hormone   Weight gain       Relevant Orders   TSH + free T4     Will check an [facility] for her hot flashes.  We will also check thyroid function for her weight gain as she has demonstrated a significant weight gain  over the past 8 months.  I have prescribed trazodone for her insomnia.  Her diltiazem does slow the rate of metabolism of trazodone and so we will start at a low dose of 25 mg initially and increase slowly until desired effect.  This may affect her general fatigue, or weight gain, or both.  It is unlikely that all these symptoms are related.  However, we will try to find a unifying diagnosis.    Prentice Docker, MD 01/28/2020 3:10 PM

## 2020-01-29 LAB — TSH+FREE T4
Free T4: 1.22 ng/dL (ref 0.82–1.77)
TSH: 1.61 u[IU]/mL (ref 0.450–4.500)

## 2020-01-29 LAB — FOLLICLE STIMULATING HORMONE: FSH: 3.6 m[IU]/mL

## 2020-02-18 ENCOUNTER — Encounter: Payer: Self-pay | Admitting: Obstetrics and Gynecology

## 2020-03-08 ENCOUNTER — Other Ambulatory Visit: Payer: Self-pay | Admitting: Obstetrics and Gynecology

## 2020-03-08 DIAGNOSIS — G4709 Other insomnia: Secondary | ICD-10-CM

## 2020-03-08 NOTE — Telephone Encounter (Signed)
Please advise if Rx refill is appropriate

## 2020-04-27 ENCOUNTER — Other Ambulatory Visit: Payer: Self-pay | Admitting: Obstetrics

## 2020-04-27 ENCOUNTER — Telehealth: Payer: Self-pay

## 2020-04-27 DIAGNOSIS — G4709 Other insomnia: Secondary | ICD-10-CM

## 2020-04-27 MED ORDER — TRAZODONE HCL 50 MG PO TABS: ORAL_TABLET | ORAL | 0 refills | Status: DC

## 2020-04-27 NOTE — Telephone Encounter (Signed)
Pt aware RF sent.

## 2020-04-27 NOTE — Telephone Encounter (Signed)
I can refil her Trazadone, but she needs to follow up with Glennon Mac.  This can wait till he gets back- she had the labwork done in April and is calling back now, so she can be worked in with him or he can decide the next step by calling her.

## 2020-04-27 NOTE — Telephone Encounter (Signed)
Pt left msg on triage saying she had labs done a couple of months ago and never heard back of results. Advised they were normal. She says she still doesn't understand why she is still having the flushing, hot flashes, and insomnia. Would like msg to be sent to [facility] (aware he is out of office) and would like RF of trazodone. Can you call her in a RF?

## 2020-06-02 ENCOUNTER — Other Ambulatory Visit: Payer: Self-pay | Admitting: Obstetrics

## 2020-06-02 DIAGNOSIS — G4709 Other insomnia: Secondary | ICD-10-CM

## 2020-06-27 ENCOUNTER — Encounter: Payer: Self-pay | Admitting: Obstetrics and Gynecology

## 2020-06-27 ENCOUNTER — Ambulatory Visit (INDEPENDENT_AMBULATORY_CARE_PROVIDER_SITE_OTHER): Payer: Medicare Other | Admitting: Obstetrics and Gynecology

## 2020-06-27 ENCOUNTER — Other Ambulatory Visit: Payer: Self-pay

## 2020-06-27 VITALS — BP 126/84 | Ht 68.0 in | Wt 244.0 lb

## 2020-06-27 DIAGNOSIS — Z1339 Encounter for screening examination for other mental health and behavioral disorders: Secondary | ICD-10-CM

## 2020-06-27 DIAGNOSIS — Z8041 Family history of malignant neoplasm of ovary: Secondary | ICD-10-CM | POA: Diagnosis not present

## 2020-06-27 DIAGNOSIS — Z803 Family history of malignant neoplasm of breast: Secondary | ICD-10-CM

## 2020-06-27 DIAGNOSIS — Z01419 Encounter for gynecological examination (general) (routine) without abnormal findings: Secondary | ICD-10-CM

## 2020-06-27 DIAGNOSIS — Z1331 Encounter for screening for depression: Secondary | ICD-10-CM | POA: Diagnosis not present

## 2020-06-27 DIAGNOSIS — Z86010 Personal history of colonic polyps: Secondary | ICD-10-CM

## 2020-06-27 NOTE — Progress Notes (Signed)
Gynecology Annual Exam  PCP: Patient, No Pcp Per  Chief Complaint  Patient presents with  . Annual Exam   History of Present Illness:  Ms. Rachel Wall is a 39 y.o. [id number] who LMP was Patient's last menstrual period was 04/14/2019 (exact date)., presents today for her annual examination.  Her menses are absent due to history of hysterectomy.  She gets flushing from her chest up.  This occurs all day.   She is sexually active, but not often. She doesn't have much desire.  Last Pap: 11/2018  Results were: no abnormalities /neg HPV DNA negative  There is a of breast cancer in her paternal aunt. There is a FH of ovarian cancer in her paternal aunt. The patient does do self-breast exams.  Tobacco use: former smoker x 1 year now Alcohol use: social drinker Exercise: not active  The patient wears seatbelts: yes.   The patient reports that domestic violence in her life is absent.   She states that she is going to have some sort of weight surgery.  She is not sure what type she will be getting.   She is still not sleeping well. She is taking trazodone 50 mg daily since her last visit.  Past Medical History:  Diagnosis Date  . 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 Depression   . GERD (gastroesophageal reflux disease)   . History of kidney stones   . Presence of permanent cardiac pacemaker   . Pulmonary hypertension ([place])   . Tachycardia     Past Surgical History:  Procedure Laterality Date  . CYSTOSCOPY  05/12/2019   Procedure: CYSTOSCOPY;  Surgeon: Will Bonnet, MD;  Location: [facility] ORS;  Service: Gynecology;;  . heart monitor implant and removal    . INSERT / REPLACE / REMOVE PACEMAKER    . LITHOTRIPSY  2012  . PACEMAKER INSERTION  2013  . STENT REMOVAL    . TOTAL LAPAROSCOPIC HYSTERECTOMY WITH SALPINGECTOMY Bilateral 05/12/2019   Procedure: TOTAL LAPAROSCOPIC HYSTERECTOMY;  Surgeon: Will Bonnet, MD;  Location: [facility] ORS;  Service: Gynecology;  Laterality:  Bilateral;  . TUBAL LIGATION  10/10/2011    Prior to Admission medications   Medication Sig Start Date End Date Taking? Authorizing Provider  albuterol (PROAIR HFA) 108 (90 Base) MCG/ACT inhaler Inhale 2 puffs into the lungs every 6 (six) hours as needed for wheezing or shortness of breath.  11/10/18  Yes [provider]  ambrisentan (LETAIRIS) 10 MG tablet Take 10 mg by mouth daily.    Yes [provider]  citalopram (CELEXA) 40 MG tablet Take 40 mg by mouth daily.   Yes [provider]  diltiazem (CARDIZEM) 120 MG tablet Take 120-240 mg by mouth See admin instructions. Take 120 mg by mouth in the morning and take 240 mg by mouth at bedtime   Yes [provider]  famotidine (PEPCID AC MAXIMUM STRENGTH) 20 MG tablet Take 40 mg by mouth 2 (two) times daily.   Yes [provider]  Selexipag (UPTRAVI) 1200 MCG TABS Take 1,200 mcg by mouth 2 (two) times daily.    Yes [provider]  tadalafil, PAH, (ADCIRCA) 20 MG tablet  06/04/19  Yes [provider]  traZODone (DESYREL) 50 MG tablet TAKE 1 TABLET(50 MG) BY MOUTH AT BEDTIME. START WITH 1/2 TABLET AT BEDTIME. INCREASE TO 1 TABLET, IF NO RESPONSE 06/03/20  Yes Imagene Riches, CNM    Allergies  Allergen Reactions  . Cyclosporine Other (See Comments)  Medication interactions  . Nitroglycerin Other (See Comments)    Medication interactions  . Other Other (See Comments)    Muscle relaxers - per patient interacts with other medications she is on.  . Adhesive [Tape] Rash   Obstetric History: [id number]  Social History   Socioeconomic History  . Marital status: Married    Spouse name: Not on file  . Number of children: Not on file  . Years of education: Not on file  . Highest education level: Not on file  Occupational History  . Not on file  Tobacco Use  . Smoking status: Former Smoker    Packs/day: 0.50    Types: Cigarettes  . Smokeless tobacco: Never Used  Vaping Use  .  Vaping Use: Never used  Substance and Sexual Activity  . Alcohol use: Not Currently  . Drug use: Not Currently  . Sexual activity: Yes    Birth control/protection: Surgical    Comment: Tubal ligation/Hysterectomy  Other Topics Concern  . Not on file  Social History Narrative  . Not on file   Social Determinants of Health   Financial Resource Strain:   . Difficulty of Paying Living Expenses: Not on file  Food Insecurity:   . Worried About Charity fundraiser in the Last Year: Not on file  . Ran Out of Food in the Last Year: Not on file  Transportation Needs:   . Lack of Transportation (Medical): Not on file  . Lack of Transportation (Non-Medical): Not on file  Physical Activity:   . Days of Exercise per Week: Not on file  . Minutes of Exercise per Session: Not on file  Stress:   . Feeling of Stress : Not on file  Social Connections:   . Frequency of Communication with Friends and Family: Not on file  . Frequency of Social Gatherings with Friends and Family: Not on file  . Attends Religious Services: Not on file  . Active Member of Clubs or Organizations: Not on file  . Attends Archivist Meetings: Not on file  . Marital Status: Not on file  Intimate Partner Violence:   . Fear of Current or Ex-Partner: Not on file  . Emotionally Abused: Not on file  . Physically Abused: Not on file  . Sexually Abused: Not on file    Family History  Problem Relation Age of Onset  . Healthy Mother   . Lung disease Father   . Heart disease Father   . Diabetes Father   . Hypertension Father   . Kidney cancer Father   . Colon cancer Paternal Grandfather     Review of Systems  Constitutional: Negative.   HENT: Negative.   Eyes: Negative.   Respiratory: Negative.   Cardiovascular: Negative.   Gastrointestinal: Negative.   Genitourinary: Negative.   Musculoskeletal: Negative.   Skin: Negative.   Neurological: Negative.   Psychiatric/Behavioral: Negative.      Physical  Exam BP 126/84   Ht 5\' 8"  (1.727 m)   Wt 244 lb (110.7 kg)   LMP 04/14/2019 (Exact Date)   BMI 37.10 kg/m    Physical Exam Constitutional:      General: She is not in acute distress.    Appearance: Normal appearance. She is well-developed.  Genitourinary:     Pelvic exam was performed with patient in the lithotomy position.     Vulva, urethra and bladder normal.     No inguinal adenopathy present in the right or left side.  No signs of injury in the vagina.     No vaginal discharge, erythema, tenderness or bleeding.     Cervix is absent.     Uterus is absent.     No right or left adnexal mass present.     Right adnexa not tender or full.     Left adnexa not tender or full.  HENT:     Head: Normocephalic and atraumatic.  Eyes:     General: No scleral icterus.    Conjunctiva/sclera: Conjunctivae normal.  Neck:     Thyroid: No thyromegaly.  Cardiovascular:     Rate and Rhythm: Normal rate and regular rhythm.     Heart sounds: No murmur heard.  No friction rub. No gallop.   Pulmonary:     Effort: Pulmonary effort is normal. No respiratory distress.     Breath sounds: Normal breath sounds. No wheezing or rales.  Chest:     Breasts:        Right: No inverted nipple, mass, nipple discharge, skin change or tenderness.        Left: No inverted nipple, mass, nipple discharge, skin change or tenderness.  Abdominal:     General: Bowel sounds are normal. There is no distension.     Palpations: Abdomen is soft. There is no mass.     Tenderness: There is no abdominal tenderness. There is no guarding or rebound.  Musculoskeletal:        General: No swelling or tenderness. Normal range of motion.     Cervical back: Normal range of motion and neck supple.  Lymphadenopathy:     Cervical: No cervical adenopathy.     Lower Body: No right inguinal adenopathy. No left inguinal adenopathy.  Neurological:     General: No focal deficit present.     Mental Status: She is alert and  oriented to person, place, and time.     Cranial Nerves: No cranial nerve deficit.  Skin:    General: Skin is warm and dry.     Findings: No erythema or rash.  Psychiatric:        Mood and Affect: Mood normal.        Behavior: Behavior normal.        Judgment: Judgment normal.     Female chaperone present for pelvic and breast  portions of the physical exam  Results: AUDIT Questionnaire (screen for alcoholism): 1 PHQ-9: 6   Assessment: 39 y.o. U2P5361 female here for routine annual gynecologic examination  Plan: Problem List Items Addressed This Visit    None    Visit Diagnoses    Women's annual routine gynecological examination    -  Primary   Relevant Orders   Ambulatory referral to Gastroenterology   Screening for depression       Screening for alcoholism       Family history of ovarian cancer       Family history of breast cancer       History of colon polyps       Relevant Orders   Ambulatory referral to Gastroenterology      Screening: -- Blood pressure screen normal -- Weight screening: obese: discussed management options, including lifestyle, dietary, and exercise. -- Depression screening negative (PHQ-9) -- Nutrition: normal -- cholesterol screening: per PCP -- osteoporosis screening: not due -- tobacco screening: not using -- alcohol screening: AUDIT questionnaire indicates low-risk usage. -- family history of breast cancer screening: done. not at high risk. -- no evidence of domestic  violence or intimate partner violence. -- STD screening: gonorrhea/chlamydia NAAT not collected per patient request. -- pap smear not collected per ASCCP guidelines  Family history breast and ovarian cancer: in her paternal aunt. Discussed testing options and possible risks and benefits of completing testing. She will consider.   Prentice Docker, MD 06/27/2020 11:21 AM

## 2020-06-29 ENCOUNTER — Encounter: Payer: Self-pay | Admitting: Obstetrics and Gynecology

## 2020-07-13 ENCOUNTER — Other Ambulatory Visit: Payer: Self-pay

## 2020-07-13 ENCOUNTER — Telehealth (INDEPENDENT_AMBULATORY_CARE_PROVIDER_SITE_OTHER): Payer: Self-pay | Admitting: Gastroenterology

## 2020-07-13 VITALS — Ht 68.0 in | Wt 250.0 lb

## 2020-07-13 DIAGNOSIS — Z86010 Personal history of colonic polyps: Secondary | ICD-10-CM

## 2020-07-13 MED ORDER — SUTAB 1479-225-188 MG PO TABS: 24.0000 | ORAL_TABLET | Freq: Once | ORAL | 0 refills | Status: AC

## 2020-07-13 MED ORDER — PEG 3350-KCL-NA BICARB-NACL 420 G PO SOLR: 4000.0000 mL | Freq: Once | ORAL | 0 refills | Status: AC

## 2020-07-13 NOTE — Progress Notes (Signed)
Gastroenterology Pre-Procedure Review  Request Date: 08/04/20 Requesting Physician: Dr. Vicente Males  PATIENT REVIEW QUESTIONS: The patient responded to the following health history questions as indicated:    1. Are you having any GI issues? no 2. Do you have a personal history of Polyps? yes (patient states she had colonoscopy years ago but no date provider.) 3. Do you have a family history of Colon Cancer or Polyps? yes (Parental Grandfather- colon cancer; Sister- colon cancer) 4. Diabetes Mellitus? no 5. Joint replacements in the past 12 months?no 6. Major health problems in the past 3 months?no 7. Any artificial heart valves, MVP, or defibrillator?yes (patient states pacemaker)    MEDICATIONS & ALLERGIES:    Patient reports the following regarding taking any anticoagulation/antiplatelet therapy:   Plavix, Coumadin, Eliquis, Xarelto, Lovenox, Pradaxa, Brilinta, or Effient? no Aspirin? no  Patient confirms/reports the following medications:  Current Outpatient Medications  Medication Sig Dispense Refill  . albuterol (PROAIR HFA) 108 (90 Base) MCG/ACT inhaler Inhale 2 puffs into the lungs every 6 (six) hours as needed for wheezing or shortness of breath.     Marland Kitchen ambrisentan (LETAIRIS) 10 MG tablet Take 10 mg by mouth daily.     . citalopram (CELEXA) 40 MG tablet Take 40 mg by mouth daily.    Marland Kitchen diltiazem (CARDIZEM) 120 MG tablet Take 120-240 mg by mouth See admin instructions. Take 120 mg by mouth in the morning and take 240 mg by mouth at bedtime    . famotidine (PEPCID AC MAXIMUM STRENGTH) 20 MG tablet Take 40 mg by mouth 2 (two) times daily.    . Selexipag (UPTRAVI) 1200 MCG TABS Take 1,200 mcg by mouth 2 (two) times daily.     . tadalafil, PAH, (ADCIRCA) 20 MG tablet     . amoxicillin-clavulanate (AUGMENTIN) 875-125 MG tablet Take 1 tablet by mouth 2 (two) times daily.    . ciprofloxacin-dexamethasone (CIPRODEX) OTIC suspension SHAKE LIQUID AND INSTILL 4 DROPS IN BOTH EARS TWICE DAILY FOR 10  DAYS    . furosemide (LASIX) 20 MG tablet Take 20 mg by mouth daily.    . polyethylene glycol-electrolytes (GAVILYTE-N WITH FLAVOR PACK) 420 g solution Take 4,000 mLs by mouth once for 1 dose. At 5pm drink 8 oz. Of prep every 15-20 mins. Drink only water after you start the prep. 4000 mL 0  . Sodium Sulfate-Mag Sulfate-KCl (SUTAB) [phone] MG TABS Take 24 tablets by mouth once for 1 dose. 12 tablet 0  . traZODone (DESYREL) 50 MG tablet TAKE 1 TABLET(50 MG) BY MOUTH AT BEDTIME. START WITH 1/2 TABLET AT BEDTIME. INCREASE TO 1 TABLET, IF NO RESPONSE (Patient not taking: Reported on 07/13/2020) 30 tablet 0   No current facility-administered medications for this visit.    Patient confirms/reports the following allergies:  Allergies  Allergen Reactions  . Cyclosporine Other (See Comments)    Medication interactions  . Nitroglycerin Other (See Comments)    Medication interactions  . Other Other (See Comments)    Muscle relaxers - per patient interacts with other medications she is on.  . Tizanidine     Interaction with heart and lung medications  . Adhesive [Tape] Rash  . Tapentadol Rash    No orders of the defined types were placed in this encounter.   AUTHORIZATION INFORMATION Primary Insurance: 1D#: Group #:  Secondary Insurance: 1D#: Group #:  SCHEDULE INFORMATION: Date: 08/04/2020 Time: Location:[facility]

## 2020-07-13 NOTE — Progress Notes (Signed)
Sent instructions via mail.

## 2020-07-20 ENCOUNTER — Other Ambulatory Visit: Payer: Self-pay | Admitting: Otolaryngology

## 2020-07-22 LAB — SURGICAL PATHOLOGY

## 2020-08-02 ENCOUNTER — Other Ambulatory Visit
Admission: RE | Admit: 2020-08-02 | Discharge: 2020-08-02 | Disposition: A | Discharge status: Home / Self Care | Payer: Medicare Other | Source: Ambulatory Visit | Attending: Gastroenterology | Admitting: Gastroenterology

## 2020-08-02 ENCOUNTER — Other Ambulatory Visit: Payer: Self-pay

## 2020-08-02 ENCOUNTER — Telehealth: Payer: Self-pay

## 2020-08-02 DIAGNOSIS — Z01812 Encounter for preprocedural laboratory examination: Secondary | ICD-10-CM | POA: Diagnosis present

## 2020-08-02 DIAGNOSIS — Z20822 Contact with and (suspected) exposure to covid-19: Secondary | ICD-10-CM | POA: Diagnosis not present

## 2020-08-02 DIAGNOSIS — G4709 Other insomnia: Secondary | ICD-10-CM

## 2020-08-02 LAB — SARS CORONAVIRUS 2 (TAT 6-24 HRS): SARS Coronavirus 2: NEGATIVE

## 2020-08-02 NOTE — Telephone Encounter (Signed)
Returned patients call. Pt informed me the instructions were lost in the mail. I re-printed instruction and left at the front desk for pick up.

## 2020-08-03 ENCOUNTER — Encounter: Payer: Self-pay | Admitting: Gastroenterology

## 2020-08-04 ENCOUNTER — Other Ambulatory Visit: Payer: Self-pay

## 2020-08-04 ENCOUNTER — Encounter
Admission: RE | Disposition: A | Discharge status: Home / Self Care | Payer: Self-pay | Source: Home / Self Care | Attending: Gastroenterology

## 2020-08-04 ENCOUNTER — Ambulatory Visit: Payer: Medicare Other | Admitting: Certified Registered Nurse Anesthetist

## 2020-08-04 ENCOUNTER — Encounter: Payer: Self-pay | Admitting: Gastroenterology

## 2020-08-04 ENCOUNTER — Ambulatory Visit
Admission: RE | Admit: 2020-08-04 | Discharge: 2020-08-04 | Disposition: A | Discharge status: Home / Self Care | Payer: Medicare Other | Attending: Gastroenterology | Admitting: Gastroenterology

## 2020-08-04 ENCOUNTER — Telehealth: Payer: Self-pay | Admitting: Gastroenterology

## 2020-08-04 DIAGNOSIS — Z885 Allergy status to narcotic agent status: Secondary | ICD-10-CM | POA: Diagnosis not present

## 2020-08-04 DIAGNOSIS — D120 Benign neoplasm of cecum: Secondary | ICD-10-CM | POA: Diagnosis not present

## 2020-08-04 DIAGNOSIS — Z09 Encounter for follow-up examination after completed treatment for conditions other than malignant neoplasm: Secondary | ICD-10-CM | POA: Diagnosis not present

## 2020-08-04 DIAGNOSIS — Z950 Presence of cardiac pacemaker: Secondary | ICD-10-CM | POA: Diagnosis not present

## 2020-08-04 DIAGNOSIS — K635 Polyp of colon: Secondary | ICD-10-CM | POA: Diagnosis not present

## 2020-08-04 DIAGNOSIS — Z87891 Personal history of nicotine dependence: Secondary | ICD-10-CM | POA: Insufficient documentation

## 2020-08-04 DIAGNOSIS — Z86010 Personal history of colonic polyps: Secondary | ICD-10-CM | POA: Insufficient documentation

## 2020-08-04 DIAGNOSIS — Z888 Allergy status to other drugs, medicaments and biological substances status: Secondary | ICD-10-CM | POA: Diagnosis not present

## 2020-08-04 HISTORY — PX: COLONOSCOPY WITH PROPOFOL: SHX5780

## 2020-08-04 SURGERY — COLONOSCOPY WITH PROPOFOL
Anesthesia: General

## 2020-08-04 MED ORDER — PROPOFOL 10 MG/ML IV BOLUS
INTRAVENOUS | Status: DC | PRN
  Administered 2020-08-04: 50 mg via INTRAVENOUS
  Administered 2020-08-04: 20 mg via INTRAVENOUS
  Administered 2020-08-04: 50 mg via INTRAVENOUS

## 2020-08-04 MED ORDER — PHENYLEPHRINE HCL (PRESSORS) 10 MG/ML IV SOLN
INTRAVENOUS | Status: DC | PRN
  Administered 2020-08-04 (×2): 100 ug via INTRAVENOUS

## 2020-08-04 MED ORDER — PROPOFOL 10 MG/ML IV BOLUS
INTRAVENOUS | Status: AC
  Filled 2020-08-04: qty 20

## 2020-08-04 MED ORDER — SODIUM CHLORIDE 0.9 % IV SOLN: INTRAVENOUS | Status: DC

## 2020-08-04 MED ORDER — LIDOCAINE HCL (CARDIAC) PF 100 MG/5ML IV SOSY
PREFILLED_SYRINGE | INTRAVENOUS | Status: DC | PRN
  Administered 2020-08-04: 50 mg via INTRAVENOUS

## 2020-08-04 MED ORDER — PROPOFOL 500 MG/50ML IV EMUL
INTRAVENOUS | Status: DC | PRN
  Administered 2020-08-04: 125 ug/kg/min via INTRAVENOUS

## 2020-08-04 NOTE — H&P (Signed)
Jonathon Bellows, MD [address] [place], [place], [place], [address] [id number], [place], [place], [place], [address] Phone: [phone]  Fax: [phone]  Primary Care Physician:  Agency, [facility]   Pre-Procedure History & Physical: HPI:  Rachel Wall is a 39 y.o. female is here for an colonoscopy.   Past Medical History:  Diagnosis Date  . 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 Depression   . Family history of breast cancer   . Family history of ovarian cancer   . GERD (gastroesophageal reflux disease)   . History of kidney stones   . Presence of permanent cardiac pacemaker   . Pulmonary hypertension ([place])   . Tachycardia     Past Surgical History:  Procedure Laterality Date  . CYSTOSCOPY  05/12/2019   Procedure: CYSTOSCOPY;  Surgeon: Will Bonnet, MD;  Location: [facility] ORS;  Service: Gynecology;;  . heart monitor implant and removal    . INSERT / REPLACE / REMOVE PACEMAKER    . LITHOTRIPSY  2012  . PACEMAKER INSERTION  2013  . STENT REMOVAL    . TOTAL LAPAROSCOPIC HYSTERECTOMY WITH SALPINGECTOMY Bilateral 05/12/2019   Procedure: TOTAL LAPAROSCOPIC HYSTERECTOMY;  Surgeon: Will Bonnet, MD;  Location: [facility] ORS;  Service: Gynecology;  Laterality: Bilateral;  . TUBAL LIGATION  10/10/2011    Prior to Admission medications   Medication Sig Start Date End Date Taking? Authorizing Provider  albuterol (PROAIR HFA) 108 (90 Base) MCG/ACT inhaler Inhale 2 puffs into the lungs every 6 (six) hours as needed for wheezing or shortness of breath.  11/10/18  Yes [provider]  ambrisentan (LETAIRIS) 10 MG tablet Take 10 mg by mouth daily.    Yes [provider]  citalopram (CELEXA) 40 MG tablet Take 40 mg by mouth daily.   Yes [provider]  diltiazem (CARDIZEM) 120 MG tablet Take 120-240 mg by mouth See admin instructions. Take 120 mg by mouth in the morning and take 240 mg by mouth at bedtime   Yes [provider]  famotidine (PEPCID AC MAXIMUM STRENGTH) 20 MG tablet Take 40 mg by mouth 2 (two) times daily.   Yes [provider]  furosemide (LASIX) 20 MG tablet Take 20 mg by mouth daily. 05/17/20  Yes [provider]  Selexipag (UPTRAVI) 1200 MCG TABS Take 1,200 mcg by mouth 2 (two) times daily.    Yes [provider]  tadalafil, PAH, (ADCIRCA) 20 MG tablet  06/04/19  Yes [provider]  traZODone (DESYREL) 50 MG tablet TAKE 1 TABLET(50 MG) BY MOUTH AT BEDTIME. START WITH 1/2 TABLET AT BEDTIME. INCREASE TO 1 TABLET, IF NO RESPONSE 06/03/20  Yes Imagene Riches, CNM  amoxicillin-clavulanate (AUGMENTIN) 875-125 MG tablet Take 1 tablet by mouth 2 (two) times daily. Patient not taking: Reported on 08/04/2020 07/07/20   [provider]  ciprofloxacin-dexamethasone ([place]) OTIC suspension SHAKE LIQUID AND INSTILL 4 DROPS IN BOTH EARS TWICE DAILY FOR 10 DAYS Patient not taking: Reported on 08/04/2020 07/07/20   [provider]    Allergies as of 07/13/2020 - Review Complete 07/13/2020  Allergen Reaction Noted  . Cyclosporine Other (See Comments) 05/05/2019  . Nitroglycerin Other (See Comments) 05/05/2019  . Other Other (See Comments) 10/06/2018  . Tizanidine  07/01/2020  . Adhesive [tape] Rash 05/12/2019  . Tapentadol Rash 05/12/2019    Family History  Problem Relation Age of Onset  . Healthy Mother   . Lung disease Father   .  Heart disease Father   . Diabetes Father   . Hypertension Father   . Kidney cancer Father   . Colon cancer Paternal Grandfather   . Colon cancer Sister   . Breast cancer Paternal Aunt   . Ovarian cancer Paternal Aunt     Social History   Socioeconomic History  . Marital status: Married    Spouse name: Not on file  . Number of children: Not on file  . Years of education: Not on file  . Highest education level: Not on file  Occupational History  . Not on file  Tobacco Use  . Smoking status: Former  Smoker    Packs/day: 0.50    Types: Cigarettes  . Smokeless tobacco: Never Used  Vaping Use  . Vaping Use: Never used  Substance and Sexual Activity  . Alcohol use: Not Currently  . Drug use: Not Currently  . Sexual activity: Yes    Birth control/protection: Surgical    Comment: Tubal ligation/Hysterectomy  Other Topics Concern  . Not on file  Social History Narrative  . Not on file   Social Determinants of Health   Financial Resource Strain:   . Difficulty of Paying Living Expenses: Not on file  Food Insecurity:   . Worried About Charity fundraiser in the Last Year: Not on file  . Ran Out of Food in the Last Year: Not on file  Transportation Needs:   . Lack of Transportation (Medical): Not on file  . Lack of Transportation (Non-Medical): Not on file  Physical Activity:   . Days of Exercise per Week: Not on file  . Minutes of Exercise per Session: Not on file  Stress:   . Feeling of Stress : Not on file  Social Connections:   . Frequency of Communication with Friends and Family: Not on file  . Frequency of Social Gatherings with Friends and Family: Not on file  . Attends Religious Services: Not on file  . Active Member of Clubs or Organizations: Not on file  . Attends Archivist Meetings: Not on file  . Marital Status: Not on file  Intimate Partner Violence:   . Fear of Current or Ex-Partner: Not on file  . Emotionally Abused: Not on file  . Physically Abused: Not on file  . Sexually Abused: Not on file    Review of Systems: See HPI, otherwise negative ROS  Physical Exam: BP 120/71   Pulse 78   Temp 97.8 F (36.6 C) (Temporal)   Resp 17   Ht 5\' 8"  (1.727 m)   Wt 113.4 kg   LMP 04/14/2019 (Exact Date)   SpO2 97%   BMI 38.01 kg/m  General:   Alert,  pleasant and cooperative in NAD Head:  Normocephalic and atraumatic. Neck:  Supple; no masses or thyromegaly. Lungs:  Clear throughout to auscultation, normal respiratory effort.    Heart:  +S1,  +S2, Regular rate and rhythm, No edema. Abdomen:  Soft, nontender and nondistended. Normal bowel sounds, without guarding, and without rebound.   Neurologic:  Alert and  oriented x4;  grossly normal neurologically.  Impression/Plan: Rachel Wall is here for an colonoscopy to be performed for surveillance due to prior history of colon polyps .history of colon polyps > 15, 2 years back in [place] unknown type , report not available  Risks, benefits, limitations, and alternatives regarding  colonoscopy have been reviewed with the patient.  Questions have been answered.  All parties agreeable.   Jonathon Bellows,  MD  08/04/2020, 9:11 AM

## 2020-08-04 NOTE — Telephone Encounter (Signed)
Patient called to schedule another procedure. Patient stated she wasn't cleaned out enough on 08/04/20, per patient. Clinical staff was informed.

## 2020-08-04 NOTE — Transfer of Care (Signed)
Immediate Anesthesia Transfer of Care Note  Patient: Ernie Hew  Procedure(s) Performed: COLONOSCOPY WITH PROPOFOL (N/A )  Patient Location: PACU  Anesthesia Type:General  Level of Consciousness: awake, alert  and oriented  Airway & Oxygen Therapy: Patient Spontanous Breathing and Patient connected to nasal cannula oxygen  Post-op Assessment: Report given to RN and Post -op Vital signs reviewed and stable  Post vital signs: Reviewed and stable  Last Vitals:  Vitals Value Taken Time  BP 133/113 08/04/20 0936  Temp    Pulse 72 08/04/20 0937  Resp 18 08/04/20 0937  SpO2 95 % 08/04/20 0937  Vitals shown include unvalidated device data.  Last Pain:  Vitals:   08/04/20 0823  TempSrc: Temporal  PainSc: 0-No pain         Complications: No complications documented.

## 2020-08-04 NOTE — Anesthesia Postprocedure Evaluation (Signed)
Anesthesia Post Note  Patient: Rachel Wall  Procedure(s) Performed: COLONOSCOPY WITH PROPOFOL (N/A )  Patient location during evaluation: Endoscopy Anesthesia Type: General Level of consciousness: awake and alert and oriented Pain management: pain level controlled Vital Signs Assessment: post-procedure vital signs reviewed and stable Respiratory status: spontaneous breathing Cardiovascular status: blood pressure returned to baseline Anesthetic complications: no   No complications documented.   Last Vitals:  Vitals:   08/04/20 0940 08/04/20 0947  BP: (!) 105/54 111/64  Pulse: 73 68  Resp: 17 15  Temp: 36.6 C   SpO2: 94% 96%    Last Pain:  Vitals:   08/04/20 0947  TempSrc:   PainSc: 0-No pain                 Brinlynn Gorton

## 2020-08-04 NOTE — Anesthesia Preprocedure Evaluation (Signed)
Anesthesia Evaluation  Patient identified by MRN, date of birth, ID band Patient awake    Reviewed: Allergy & Precautions, H&P , NPO status , Patient's Chart, lab work & pertinent test results, reviewed documented beta blocker date and time   Airway Mallampati: II  TM Distance: >3 FB Neck ROM: full    Dental  (+) Teeth Intact   Pulmonary COPD, Current Smoker and Patient abstained from smoking., former smoker,    Pulmonary exam normal        Cardiovascular Exercise Tolerance: Good Normal cardiovascular exam+ dysrhythmias + pacemaker + Valvular Problems/Murmurs  Rhythm:regular Rate:Normal  Cardiac clearance and recent PM eval ok. Hx of sinus node dysfunctionJA   Neuro/Psych PSYCHIATRIC DISORDERS Depression negative neurological ROS     GI/Hepatic Neg liver ROS, GERD  Medicated,  Endo/Other  negative endocrine ROS  Renal/GU negative Renal ROS  negative genitourinary   Musculoskeletal   Abdominal   Peds  Hematology negative hematology ROS (+)   Anesthesia Other Findings Past Medical History: No date: Bradycardia     Comment:  has pacemaker No date: Depression No date: GERD (gastroesophageal reflux disease) No date: History of kidney stones No date: Presence of permanent cardiac pacemaker No date: Pulmonary hypertension ([place]) No date: Tachycardia Past Surgical History: No date: heart monitor implant and removal No date: INSERT / REPLACE / REMOVE PACEMAKER 2012: LITHOTRIPSY 2013: PACEMAKER INSERTION No date: STENT REMOVAL 10/10/2011: TUBAL LIGATION   Reproductive/Obstetrics negative OB ROS                             Anesthesia Physical  Anesthesia Plan  ASA: III  Anesthesia Plan: General   Post-op Pain Management:    Induction: Intravenous  PONV Risk Score and Plan: Propofol infusion  Airway Management Planned:   Additional Equipment:   Intra-op Plan:   Post-operative  Plan:   Informed Consent: I have reviewed the patients History and Physical, chart, labs and discussed the procedure including the risks, benefits and alternatives for the proposed anesthesia with the patient or authorized representative who has indicated his/her understanding and acceptance.     Dental Advisory Given and Dental advisory given  Plan Discussed with: CRNA  Anesthesia Plan Comments:         Anesthesia Quick Evaluation

## 2020-08-04 NOTE — Op Note (Signed)
[facility] Gastroenterology Patient Name: Rachel Wall Procedure Date: 08/04/2020 9:13 AM MRN: [id number] Account #: [id number] Date of Birth: Jul 30, 1981 Admit Type: Outpatient Age: 39 Room: [facility] ENDO ROOM 3 Gender: Female Note Status: Finalized Procedure:             Colonoscopy Indications:           Surveillance: Personal history of colonic polyps with                         unknown histology (unable to locate pathology results                         from last colonoscopy less than 3 years ago) Providers:             Jonathon Bellows MD, MD Referring MD:          No Local Md, MD (Referring MD) Medicines:             Monitored Anesthesia Care Complications:         No immediate complications. Procedure:             Pre-Anesthesia Assessment:                        - Prior to the procedure, a History and Physical was                         performed, and patient medications, allergies and                         sensitivities were reviewed. The patient's tolerance                         of previous anesthesia was reviewed.                        - The risks and benefits of the procedure and the                         sedation options and risks were discussed with the                         patient. All questions were answered and informed                         consent was obtained.                        - ASA Grade Assessment: II - A patient with mild                         systemic disease.                        After obtaining informed consent, the colonoscope was                         passed under direct vision. Throughout the procedure,                         the  patient's blood pressure, pulse, and oxygen                         saturations were monitored continuously. The                         Colonoscope was introduced through the anus and                         advanced to the the cecum, identified by the                         appendiceal  orifice. The colonoscopy was performed                         with ease. The patient tolerated the procedure well.                         The quality of the bowel preparation was fair. Findings:      The perianal and digital rectal examinations were normal.      A 4 mm polyp was found in the cecum. The polyp was sessile. The polyp       was removed with a cold snare. Resection and retrieval were complete. Impression:            - Preparation of the colon was fair.                        - One 4 mm polyp in the cecum, removed with a cold                         snare. Resected and retrieved. Recommendation:        - Await pathology results.                        - Discharge patient to home (with escort).                        - Resume previous diet.                        - Continue present medications.                        - Repeat colonoscopy in 4 months because the bowel                         preparation was suboptimal. Procedure Code(s):     --- Professional ---                        [phone], Colonoscopy, flexible; with removal of                         tumor(s), polyp(s), or other lesion(s) by snare                         technique Diagnosis Code(s):     --- Professional ---  Z86.010, Personal history of colonic polyps                        K63.5, Polyp of colon CPT copyright 2019 American Medical Association. All rights reserved. The codes documented in this report are preliminary and upon coder review may  be revised to meet current compliance requirements. Jonathon Bellows, MD Jonathon Bellows MD, MD 08/04/2020 9:35:39 AM This report has been signed electronically. Number of Addenda: 0 Note Initiated On: 08/04/2020 9:13 AM Scope Withdrawal Time: 0 hours 10 minutes 49 seconds  Total Procedure Duration: 0 hours 12 minutes 36 seconds  Estimated Blood Loss:  Estimated blood loss: none.      [facility]

## 2020-08-05 ENCOUNTER — Encounter: Payer: Self-pay | Admitting: Gastroenterology

## 2020-08-05 ENCOUNTER — Other Ambulatory Visit: Payer: Self-pay

## 2020-08-05 DIAGNOSIS — Z86010 Personal history of colonic polyps: Secondary | ICD-10-CM

## 2020-08-05 LAB — SURGICAL PATHOLOGY

## 2020-08-05 NOTE — Progress Notes (Signed)
Patient requested an appointment as soon as possible because she will no longer have any insurance. New procedure instructions have been sent.

## 2020-08-08 ENCOUNTER — Other Ambulatory Visit: Payer: Self-pay

## 2020-08-08 ENCOUNTER — Encounter: Payer: Self-pay | Admitting: Gastroenterology

## 2020-08-08 ENCOUNTER — Other Ambulatory Visit
Admission: RE | Admit: 2020-08-08 | Discharge: 2020-08-08 | Disposition: A | Discharge status: Home / Self Care | Payer: Medicare Other | Source: Ambulatory Visit | Attending: Gastroenterology | Admitting: Gastroenterology

## 2020-08-08 DIAGNOSIS — Z20822 Contact with and (suspected) exposure to covid-19: Secondary | ICD-10-CM | POA: Insufficient documentation

## 2020-08-08 DIAGNOSIS — Z01818 Encounter for other preprocedural examination: Secondary | ICD-10-CM | POA: Insufficient documentation

## 2020-08-09 ENCOUNTER — Telehealth: Payer: Self-pay

## 2020-08-09 LAB — SARS CORONAVIRUS 2 (TAT 6-24 HRS): SARS Coronavirus 2: NEGATIVE

## 2020-08-09 NOTE — Telephone Encounter (Signed)
Returned patients call. Patient had questions regarding bowel prep. Questions were answered. Pt verbalized understanding.

## 2020-08-10 ENCOUNTER — Other Ambulatory Visit: Payer: Self-pay

## 2020-08-10 ENCOUNTER — Encounter
Admission: RE | Disposition: A | Discharge status: Home / Self Care | Payer: Self-pay | Source: Home / Self Care | Attending: Gastroenterology

## 2020-08-10 ENCOUNTER — Encounter: Payer: Self-pay | Admitting: Gastroenterology

## 2020-08-10 ENCOUNTER — Ambulatory Visit
Admission: RE | Admit: 2020-08-10 | Discharge: 2020-08-10 | Disposition: A | Discharge status: Home / Self Care | Payer: Medicare Other | Attending: Gastroenterology | Admitting: Gastroenterology

## 2020-08-10 DIAGNOSIS — Z1211 Encounter for screening for malignant neoplasm of colon: Secondary | ICD-10-CM | POA: Insufficient documentation

## 2020-08-10 DIAGNOSIS — Z5309 Procedure and treatment not carried out because of other contraindication: Secondary | ICD-10-CM | POA: Insufficient documentation

## 2020-08-10 DIAGNOSIS — Z86010 Personal history of colonic polyps: Secondary | ICD-10-CM

## 2020-08-10 SURGERY — COLONOSCOPY WITH PROPOFOL
Anesthesia: General

## 2020-08-10 MED ORDER — PEG 3350-KCL-NA BICARB-NACL 420 G PO SOLR: 4000.0000 mL | Freq: Once | ORAL | 0 refills | Status: AC

## 2020-08-10 MED ORDER — PROPOFOL 500 MG/50ML IV EMUL
INTRAVENOUS | Status: AC
  Filled 2020-08-10: qty 50

## 2020-08-10 MED ORDER — PROPOFOL 10 MG/ML IV BOLUS
INTRAVENOUS | Status: AC
  Filled 2020-08-10: qty 20

## 2020-08-10 MED ORDER — SODIUM CHLORIDE 0.9 % IV SOLN: INTRAVENOUS | Status: DC

## 2020-08-10 MED ORDER — PEG 3350-KCL-NABCB-NACL-NASULF 236 G PO SOLR: ORAL | 0 refills | Status: DC

## 2020-08-10 NOTE — Progress Notes (Signed)
Pt reported unsuccessful bowel prep. After discussion with MD, decided to postpone procedure until later date.

## 2020-08-11 ENCOUNTER — Other Ambulatory Visit: Payer: Self-pay

## 2020-08-11 ENCOUNTER — Encounter: Payer: Self-pay | Admitting: Gastroenterology

## 2020-08-11 ENCOUNTER — Encounter
Admission: RE | Disposition: A | Discharge status: Home / Self Care | Payer: Self-pay | Source: Ambulatory Visit | Attending: Gastroenterology

## 2020-08-11 ENCOUNTER — Ambulatory Visit
Admission: RE | Admit: 2020-08-11 | Discharge: 2020-08-11 | Disposition: A | Discharge status: Home / Self Care | Payer: Medicare Other | Source: Ambulatory Visit | Attending: Gastroenterology | Admitting: Gastroenterology

## 2020-08-11 ENCOUNTER — Ambulatory Visit: Payer: Medicare Other | Admitting: Certified Registered"

## 2020-08-11 DIAGNOSIS — K635 Polyp of colon: Secondary | ICD-10-CM

## 2020-08-11 DIAGNOSIS — Z860100 Personal history of colon polyps, unspecified: Secondary | ICD-10-CM

## 2020-08-11 DIAGNOSIS — Z8719 Personal history of other diseases of the digestive system: Secondary | ICD-10-CM | POA: Diagnosis not present

## 2020-08-11 DIAGNOSIS — Z1211 Encounter for screening for malignant neoplasm of colon: Secondary | ICD-10-CM | POA: Diagnosis not present

## 2020-08-11 DIAGNOSIS — D123 Benign neoplasm of transverse colon: Secondary | ICD-10-CM | POA: Diagnosis not present

## 2020-08-11 HISTORY — PX: COLONOSCOPY WITH PROPOFOL: SHX5780

## 2020-08-11 SURGERY — COLONOSCOPY WITH PROPOFOL
Anesthesia: General

## 2020-08-11 MED ORDER — PROPOFOL 10 MG/ML IV BOLUS
INTRAVENOUS | Status: DC | PRN
  Administered 2020-08-11: 10 mg via INTRAVENOUS
  Administered 2020-08-11: 50 mg via INTRAVENOUS

## 2020-08-11 MED ORDER — SODIUM CHLORIDE 0.9 % IV SOLN: INTRAVENOUS | Status: DC

## 2020-08-11 MED ORDER — PROPOFOL 10 MG/ML IV BOLUS
INTRAVENOUS | Status: AC
  Filled 2020-08-11: qty 20

## 2020-08-11 MED ORDER — PROPOFOL 500 MG/50ML IV EMUL
INTRAVENOUS | Status: DC | PRN
  Administered 2020-08-11: 155 ug/kg/min via INTRAVENOUS

## 2020-08-11 MED ORDER — EPHEDRINE SULFATE 50 MG/ML IJ SOLN
INTRAMUSCULAR | Status: DC | PRN
  Administered 2020-08-11: 10 mg via INTRAVENOUS
  Administered 2020-08-11: 5 mg via INTRAVENOUS

## 2020-08-11 MED ORDER — GLYCOPYRROLATE 0.2 MG/ML IJ SOLN
INTRAMUSCULAR | Status: DC | PRN
  Administered 2020-08-11: .2 mg via INTRAVENOUS

## 2020-08-11 MED ORDER — LIDOCAINE HCL (CARDIAC) PF 100 MG/5ML IV SOSY
PREFILLED_SYRINGE | INTRAVENOUS | Status: DC | PRN
  Administered 2020-08-11: 100 mg via INTRAVENOUS

## 2020-08-11 MED ORDER — SPOT INK MARKER SYRINGE KIT
PACK | SUBMUCOSAL | Status: DC | PRN
  Administered 2020-08-11: 10 mL via SUBMUCOSAL

## 2020-08-11 MED ORDER — PHENYLEPHRINE HCL (PRESSORS) 10 MG/ML IV SOLN
INTRAVENOUS | Status: DC | PRN
  Administered 2020-08-11 (×2): 200 ug via INTRAVENOUS
  Administered 2020-08-11 (×2): 100 ug via INTRAVENOUS

## 2020-08-11 MED ORDER — MIDAZOLAM HCL 2 MG/2ML IJ SOLN
INTRAMUSCULAR | Status: AC
  Filled 2020-08-11: qty 2

## 2020-08-11 NOTE — H&P (Signed)
Jonathon Bellows, MD [address] [place], [place], [place], [address] [address], [place], [place], [place], [address] Phone: [phone]  Fax: [phone]  Primary Care Physician:  Agency, [facility]   Pre-Procedure History & Physical: HPI:  Rachel Wall is a 39 y.o. female is here for an colonoscopy.   Past Medical History:  Diagnosis Date  . 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 Depression   . Family history of breast cancer   . Family history of ovarian cancer   . GERD (gastroesophageal reflux disease)   . History of kidney stones   . Presence of permanent cardiac pacemaker   . Pulmonary hypertension ([place])   . Tachycardia     Past Surgical History:  Procedure Laterality Date  . COLONOSCOPY WITH PROPOFOL N/A 08/04/2020   Procedure: COLONOSCOPY WITH PROPOFOL;  Surgeon: Jonathon Bellows, MD;  Location: [facility] ENDOSCOPY;  Service: Gastroenterology;  Laterality: N/A;  . CYSTOSCOPY  05/12/2019   Procedure: CYSTOSCOPY;  Surgeon: Will Bonnet, MD;  Location: [facility] ORS;  Service: Gynecology;;  . heart monitor implant and removal    . INSERT / REPLACE / REMOVE PACEMAKER    . LITHOTRIPSY  2012  . PACEMAKER INSERTION  2013  . STENT REMOVAL    . TOTAL LAPAROSCOPIC HYSTERECTOMY WITH SALPINGECTOMY Bilateral 05/12/2019   Procedure: TOTAL LAPAROSCOPIC HYSTERECTOMY;  Surgeon: Will Bonnet, MD;  Location: [facility] ORS;  Service: Gynecology;  Laterality: Bilateral;  . TUBAL LIGATION  10/10/2011    Prior to Admission medications   Medication Sig Start Date End Date Taking? Authorizing Provider  citalopram (CELEXA) 40 MG tablet Take 40 mg by mouth daily.   Yes [provider]  diltiazem (CARDIZEM) 120 MG tablet Take 120-240 mg by mouth See admin instructions. Take 120 mg by mouth in the morning and take 240 mg by mouth at bedtime   Yes [provider]  famotidine (PEPCID AC MAXIMUM STRENGTH) 20 MG tablet Take 40 mg by mouth 2 (two) times daily.    Yes [provider]  furosemide (LASIX) 20 MG tablet Take 20 mg by mouth daily. 05/17/20  Yes [provider]  polyethylene glycol (GOLYTELY) 236 g solution Starting at 5:00 pm today, Drink 8 oz every 20-30 minutes until entire prep is finished. 08/10/20  Yes Jonathon Bellows, MD  Selexipag (UPTRAVI) 1200 MCG TABS Take 1,200 mcg by mouth 2 (two) times daily.    Yes [provider]  tadalafil, PAH, (ADCIRCA) 20 MG tablet  06/04/19  Yes [provider]  traZODone (DESYREL) 50 MG tablet TAKE 1 TABLET(50 MG) BY MOUTH AT BEDTIME. START WITH 1/2 TABLET AT BEDTIME. INCREASE TO 1 TABLET, IF NO RESPONSE 06/03/20  Yes Imagene Riches, CNM  albuterol [facility] HFA) 108 [phone] Base) MCG/ACT inhaler Inhale 2 puffs into the lungs every 6 (six) hours as needed for wheezing or shortness of breath.  11/10/18   [provider]  ambrisentan (LETAIRIS) 10 MG tablet Take 10 mg by mouth daily.     [provider]  amoxicillin-clavulanate (AUGMENTIN) 875-125 MG tablet Take 1 tablet by mouth 2 (two) times daily. Patient not taking: Reported on 08/04/2020 07/07/20   [provider]  ciprofloxacin-dexamethasone ([place]) OTIC suspension SHAKE LIQUID AND INSTILL 4 DROPS IN BOTH EARS TWICE DAILY FOR 10 DAYS Patient not taking: Reported on 08/04/2020 07/07/20   [provider]    Allergies as of 08/10/2020 - Review Complete 08/10/2020  Allergen Reaction Noted  .  Cyclosporine Other (See Comments) 05/05/2019  . Nitroglycerin Other (See Comments) 05/05/2019  . Other Other (See Comments) 10/06/2018  . Tizanidine  07/01/2020  . Adhesive [tape] Rash 05/12/2019  . Tapentadol Rash 05/12/2019    Family History  Problem Relation Age of Onset  . Healthy Mother   . Lung disease Father   . Heart disease Father   . Diabetes Father   . Hypertension Father   . Kidney cancer Father   . Colon cancer Paternal Grandfather   . Colon cancer Sister   . Breast cancer Paternal  Aunt   . Ovarian cancer Paternal Aunt     Social History   Socioeconomic History  . Marital status: Married    Spouse name: Not on file  . Number of children: Not on file  . Years of education: Not on file  . Highest education level: Not on file  Occupational History  . Not on file  Tobacco Use  . Smoking status: Former Smoker    Packs/day: 0.50    Types: Cigarettes  . Smokeless tobacco: Never Used  Vaping Use  . Vaping Use: Never used  Substance and Sexual Activity  . Alcohol use: Not Currently  . Drug use: Not Currently  . Sexual activity: Yes    Birth control/protection: Surgical    Comment: Tubal ligation/Hysterectomy  Other Topics Concern  . Not on file  Social History Narrative  . Not on file   Social Determinants of Health   Financial Resource Strain:   . Difficulty of Paying Living Expenses: Not on file  Food Insecurity:   . Worried About Charity fundraiser in the Last Year: Not on file  . Ran Out of Food in the Last Year: Not on file  Transportation Needs:   . Lack of Transportation (Medical): Not on file  . Lack of Transportation (Non-Medical): Not on file  Physical Activity:   . Days of Exercise per Week: Not on file  . Minutes of Exercise per Session: Not on file  Stress:   . Feeling of Stress : Not on file  Social Connections:   . Frequency of Communication with Friends and Family: Not on file  . Frequency of Social Gatherings with Friends and Family: Not on file  . Attends Religious Services: Not on file  . Active Member of Clubs or Organizations: Not on file  . Attends Archivist Meetings: Not on file  . Marital Status: Not on file  Intimate Partner Violence:   . Fear of Current or Ex-Partner: Not on file  . Emotionally Abused: Not on file  . Physically Abused: Not on file  . Sexually Abused: Not on file    Review of Systems: See HPI, otherwise negative ROS  Physical Exam: BP 118/81   Pulse 73   Temp (!) 96.8 F (36 C)    Resp 16   Ht 5\' 8"  (1.727 m)   Wt 113.4 kg   LMP 04/14/2019 (Exact Date)   SpO2 99%   BMI 38.01 kg/m  General:   Alert,  pleasant and cooperative in NAD Head:  Normocephalic and atraumatic. Neck:  Supple; no masses or thyromegaly. Lungs:  Clear throughout to auscultation, normal respiratory effort.    Heart:  +S1, +S2, Regular rate and rhythm, No edema. Abdomen:  Soft, nontender and nondistended. Normal bowel sounds, without guarding, and without rebound.   Neurologic:  Alert and  oriented x4;  grossly normal neurologically.  Impression/Plan: Rachel Wall is here for  an colonoscopy to be performed for surveillance due to prior history of colon polyps   Risks, benefits, limitations, and alternatives regarding  colonoscopy have been reviewed with the patient.  Questions have been answered.  All parties agreeable.   Jonathon Bellows, MD  08/11/2020, 11:00 AM

## 2020-08-11 NOTE — Anesthesia Postprocedure Evaluation (Signed)
Anesthesia Post Note  Patient: Rachel Wall  Procedure(s) Performed: COLONOSCOPY WITH PROPOFOL (N/A )  Patient location during evaluation: Endoscopy Anesthesia Type: General Level of consciousness: awake Pain management: pain level controlled Vital Signs Assessment: post-procedure vital signs reviewed and stable Respiratory status: spontaneous breathing Postop Assessment: no headache and no apparent nausea or vomiting Anesthetic complications: no   No complications documented.   Last Vitals:  Vitals:   08/11/20 1014 08/11/20 1200  BP: 118/81 (!) 115/101  Pulse: 73 83  Resp: 16 17  Temp: (!) 36 C (!) 35.9 C  SpO2: 99% 97%    Last Pain:  Vitals:   08/11/20 1200  TempSrc: Temporal  PainSc: 0-No pain                 Neva Seat

## 2020-08-11 NOTE — Anesthesia Procedure Notes (Signed)
Procedure Name: General with mask airway Performed by: Fletcher-Harrison, Aniyla Harling, CRNA Pre-anesthesia Checklist: Patient identified, Emergency Drugs available, Suction available and Patient being monitored Patient Re-evaluated:Patient Re-evaluated prior to induction Oxygen Delivery Method: Simple face mask Induction Type: IV induction Placement Confirmation: positive ETCO2 and CO2 detector Dental Injury: Teeth and Oropharynx as per pre-operative assessment        

## 2020-08-11 NOTE — Op Note (Signed)
[facility] Gastroenterology Patient Name: Rachel Wall Procedure Date: 08/11/2020 11:05 AM MRN: [id number] Account #: [id number] Date of Birth: Dec 08, 1980 Admit Type: Outpatient Age: 39 Room: [facility] ENDO ROOM 3 Gender: Female Note Status: Finalized Procedure:             Colonoscopy Indications:           High risk colon cancer surveillance: Personal history                         of colonic polyps, Surveillance: Personal history of                         colonic polyps (unknown histology) on last colonoscopy                         3 years ago Providers:             Jonathon Bellows MD, MD Referring MD:          No Local Md, MD (Referring MD) Medicines:             Monitored Anesthesia Care Complications:         No immediate complications. Procedure:             Pre-Anesthesia Assessment:                        - Prior to the procedure, a History and Physical was                         performed, and patient medications, allergies and                         sensitivities were reviewed. The patient's tolerance                         of previous anesthesia was reviewed.                        - The risks and benefits of the procedure and the                         sedation options and risks were discussed with the                         patient. All questions were answered and informed                         consent was obtained.                        - ASA Grade Assessment: II - A patient with mild                         systemic disease.                        After obtaining informed consent, the colonoscope was                         passed under direct vision. Throughout the  procedure,                         the patient's blood pressure, pulse, and oxygen                         saturations were monitored continuously. The                         Colonoscope was introduced through the anus and                         advanced to the the cecum,  identified by the                         appendiceal orifice. The colonoscopy was performed                         without difficulty. The patient tolerated the                         procedure well. The quality of the bowel preparation                         was fair. Findings:      The perianal and digital rectal examinations were normal.      A 10 mm polyp was found in the distal transverse colon. The polyp was       sessile. The polyp was removed with a cold snare. Resection and       retrieval were complete. To prevent bleeding after the polypectomy, two       hemostatic clips were successfully placed. There was no bleeding at the       end of the procedure.      A 15 mm polyp was found in the distal transverse colon. The polyp was       sessile. Preparations were made for mucosal resection. {skip}narro band       imaging (blue light) was done to mark the borders of the lesion. Eleview       was injected to raise the lesion. Snare mucosal resection was performed.       Resection and retrieval were complete. To prevent bleeding after mucosal       resection, three hemostatic clips were successfully placed. There was no       bleeding during, or at the end, of the procedure.      A 30 mm polyp was found in the proximal transverse colon. The polyp was       sessile. Area was unsuccessfully injected with 20 mL Eleview for lesion       assessment, but the lesion could not be lifted adequately. did not       getgood visualization of the base , central aspect appeared a bit       depressed Area was tattooed with an injection of [place] ink. Tattoo       placed proximally and distally to the lesiion      2 isolated clean based ulcers seen nonbleeding ulcerated mucosa with no       stigmata of recent bleeding were present in the cecum.      The exam was otherwise without abnormality on direct and retroflexion  views. Impression:            - Preparation of the colon was fair.                         - One 10 mm polyp in the distal transverse colon,                         removed with a cold snare. Resected and retrieved.                         Clips were placed.                        - One 15 mm polyp in the distal transverse colon,                         removed with mucosal resection. Resected and                         retrieved. Clips were placed.                        - One 30 mm polyp in the proximal transverse colon.                         Treatment not successful.                        - Mucosal ulceration.                        - The examination was otherwise normal on direct and                         retroflexion views.                        - Mucosal resection was performed. Resection and                         retrieval were complete. Recommendation:        - Discharge patient to home (with escort).                        - Resume previous diet.                        - Continue present medications.                        - Await pathology results.                        - Refer to a gastroenterologist Dr Rush Landmark or Dr                         Ardis Hughs to exc ise large colon polyp in transverse                         colon.                        -  Return to my office in 6 weeks. Procedure Code(s):     --- Professional ---                        [phone], Colonoscopy, flexible; with endoscopic mucosal                         resection                        45385, 45, Colonoscopy, flexible; with removal of                         tumor(s), polyp(s), or other lesion(s) by snare                         technique                        45381, 37, Colonoscopy, flexible; with directed                         submucosal injection(s), any substance Diagnosis Code(s):     --- Professional ---                        K63.5, Polyp of colon                        K63.3, Ulcer of intestine                        Z86.010, Personal history of colonic  polyps CPT copyright 2019 American Medical Association. All rights reserved. The codes documented in this report are preliminary and upon coder review may  be revised to meet current compliance requirements. Jonathon Bellows, MD Jonathon Bellows MD, MD 08/11/2020 12:01:01 PM This report has been signed electronically. Number of Addenda: 0 Note Initiated On: 08/11/2020 11:05 AM Scope Withdrawal Time: 0 hours 40 minutes 16 seconds  Total Procedure Duration: 0 hours 42 minutes 8 seconds  Estimated Blood Loss:  Estimated blood loss: none.      [facility]

## 2020-08-11 NOTE — Transfer of Care (Signed)
Immediate Anesthesia Transfer of Care Note  Patient: Rachel Wall  Procedure(s) Performed: COLONOSCOPY WITH PROPOFOL (N/A )  Patient Location: Endoscopy Unit  Anesthesia Type:General  Level of Consciousness: awake, drowsy and patient cooperative  Airway & Oxygen Therapy: Patient Spontanous Breathing and Patient connected to face mask oxygen  Post-op Assessment: Report given to RN and Post -op Vital signs reviewed and stable  Post vital signs: Reviewed and stable  Last Vitals:  Vitals Value Taken Time  BP    Temp    Pulse    Resp    SpO2      Last Pain:  Vitals:   08/11/20 1014  PainSc: 0-No pain         Complications: No complications documented.

## 2020-08-11 NOTE — Anesthesia Preprocedure Evaluation (Signed)
Anesthesia Evaluation  Patient identified by MRN, date of birth, ID band Patient awake    Reviewed: Allergy & Precautions, NPO status , Patient's Chart, lab work & pertinent test results  Airway Mallampati: II   Neck ROM: Full    Dental no notable dental hx.    Pulmonary neg pulmonary ROS, Patient abstained from smoking., former smoker,    Pulmonary exam normal breath sounds clear to auscultation       Cardiovascular Normal cardiovascular exam+ pacemaker  Rhythm:Regular Rate:Normal     Neuro/Psych PSYCHIATRIC DISORDERS Depression negative neurological ROS     GI/Hepatic Neg liver ROS, GERD  ,  Endo/Other  negative endocrine ROS  Renal/GU negative Renal ROS  negative genitourinary   Musculoskeletal negative musculoskeletal ROS (+)   Abdominal   Peds negative pediatric ROS (+)  Hematology negative hematology ROS (+)   Anesthesia Other Findings .Marland KitchenPast Medical History: No date: Bradycardia     Comment:  has pacemaker No date: Depression No date: Family history of breast cancer No date: Family history of ovarian cancer No date: GERD (gastroesophageal reflux disease) No date: History of kidney stones No date: Presence of permanent cardiac pacemaker No date: Pulmonary hypertension ([place]) No date: Tachycardia   Reproductive/Obstetrics negative OB ROS                             Anesthesia Physical Anesthesia Plan  ASA: III  Anesthesia Plan: General   Post-op Pain Management:    Induction: Intravenous  PONV Risk Score and Plan: 3 and Propofol infusion  Airway Management Planned: Nasal Cannula  Additional Equipment: None  Intra-op Plan:   Post-operative Plan:   Informed Consent: I have reviewed the patients History and Physical, chart, labs and discussed the procedure including the risks, benefits and alternatives for the proposed anesthesia with the patient or authorized  representative who has indicated his/her understanding and acceptance.       Plan Discussed with: CRNA, Anesthesiologist and Surgeon  Anesthesia Plan Comments:         Anesthesia Quick Evaluation

## 2020-08-12 LAB — SURGICAL PATHOLOGY

## 2020-09-02 ENCOUNTER — Telehealth: Payer: Self-pay

## 2020-09-02 NOTE — Telephone Encounter (Signed)
Hi Dr. Rush Landmark and Dr. Ardis Hughs, we received a referral from [facility] addressed to both of you for colonoscopy to excise large colon polyp in transverse colon.  Please review records and advise on scheduling.

## 2020-09-02 NOTE — Telephone Encounter (Signed)
I will leave this to Dr. Rush Landmark as this is not an area that I am an expert in and a service that I do not provide

## 2020-09-03 ENCOUNTER — Other Ambulatory Visit: Payer: Self-pay | Admitting: Obstetrics and Gynecology

## 2020-09-03 DIAGNOSIS — G4709 Other insomnia: Secondary | ICD-10-CM

## 2020-09-03 MED ORDER — TRAZODONE HCL 50 MG PO TABS: 150.0000 mg | ORAL_TABLET | Freq: Every evening | ORAL | 2 refills | Status: DC | PRN

## 2020-09-04 NOTE — Telephone Encounter (Signed)
Happy to be of assistance for this particular procedure. [name] please schedule the patient for a clinic visit (okay for telemedicine if necessary) and can be overbooked if necessary or placed in a 3:50 PM slot in the next few weeks. If patient is okay to go ahead and schedule procedure then I would go ahead and get her scheduled for colonoscopy with EMR 90-minute slot.  We can always cancel the procedure should she decide not to move forward with it after we speak in clinic. Please update Dr. Vicente Males and myself upon the date of her clinic visit and procedure (if she agrees to go ahead and schedule the procedure). Thanks. GM

## 2020-09-08 ENCOUNTER — Telehealth: Payer: Self-pay | Admitting: Gastroenterology

## 2020-09-09 ENCOUNTER — Other Ambulatory Visit: Payer: Self-pay

## 2020-09-09 DIAGNOSIS — Z86010 Personal history of colonic polyps: Secondary | ICD-10-CM

## 2020-09-09 NOTE — Telephone Encounter (Addendum)
Patient has been scheduled for OV on 10/12/20 at 11:50.  Patient agrees to proceed with the scheduling of the EMR/colonosocpy at [facility] on 10/20/20 7:30. Her COVID prescreen is scheduled for 10/17/20 . Patient not able to come for OV earlier due to scheduling conflicts with her child

## 2020-09-09 NOTE — Telephone Encounter (Signed)
See other notes for details.

## 2020-09-09 NOTE — Telephone Encounter (Signed)
Thank you for help Sheri. GM

## 2020-10-12 ENCOUNTER — Encounter: Payer: Self-pay | Admitting: Gastroenterology

## 2020-10-12 ENCOUNTER — Ambulatory Visit (INDEPENDENT_AMBULATORY_CARE_PROVIDER_SITE_OTHER): Payer: Medicare Other | Admitting: Gastroenterology

## 2020-10-12 ENCOUNTER — Other Ambulatory Visit (INDEPENDENT_AMBULATORY_CARE_PROVIDER_SITE_OTHER): Payer: Medicare Other

## 2020-10-12 VITALS — BP 108/72 | HR 84 | Ht 68.0 in | Wt 247.0 lb

## 2020-10-12 DIAGNOSIS — D123 Benign neoplasm of transverse colon: Secondary | ICD-10-CM | POA: Insufficient documentation

## 2020-10-12 DIAGNOSIS — Z800 Family history of malignant neoplasm of digestive organs: Secondary | ICD-10-CM

## 2020-10-12 DIAGNOSIS — R12 Heartburn: Secondary | ICD-10-CM | POA: Diagnosis not present

## 2020-10-12 DIAGNOSIS — K635 Polyp of colon: Secondary | ICD-10-CM

## 2020-10-12 DIAGNOSIS — Z86010 Personal history of colonic polyps: Secondary | ICD-10-CM

## 2020-10-12 DIAGNOSIS — R140 Abdominal distension (gaseous): Secondary | ICD-10-CM

## 2020-10-12 DIAGNOSIS — Z8379 Family history of other diseases of the digestive system: Secondary | ICD-10-CM | POA: Diagnosis not present

## 2020-10-12 LAB — BASIC METABOLIC PANEL
BUN: 10 mg/dL (ref 6–23)
CO2: 23 mEq/L (ref 19–32)
Calcium: 9.8 mg/dL (ref 8.4–10.5)
Chloride: 108 mEq/L (ref 96–112)
Creatinine, Ser: 0.67 mg/dL (ref 0.40–1.20)
GFR: 109.46 mL/min (ref >60.00)
Glucose, Bld: 100 mg/dL — ABNORMAL HIGH (ref 70–99)
Potassium: 4.1 mEq/L (ref 3.5–5.1)
Sodium: 137 mEq/L (ref 135–145)

## 2020-10-12 LAB — PROTIME-INR
INR: 1 ratio (ref 0.8–1.0)
Prothrombin Time: 11.6 s (ref 9.6–13.1)

## 2020-10-12 LAB — CBC
HCT: 41 % (ref 36.0–46.0)
Hemoglobin: 13.9 g/dL (ref 12.0–15.0)
MCHC: 34 g/dL (ref 30.0–36.0)
MCV: 93.4 fl (ref 78.0–100.0)
Platelets: 176 10*3/uL (ref 150.0–400.0)
RBC: 4.4 Mil/uL (ref 3.87–5.11)
RDW: 14.2 % (ref 11.5–15.5)
WBC: 8.5 10*3/uL (ref 4.0–10.5)

## 2020-10-12 MED ORDER — SUPREP BOWEL PREP KIT 17.5-3.13-1.6 GM/177ML PO SOLN: 1.0000 | ORAL | 0 refills | Status: DC

## 2020-10-12 NOTE — Patient Instructions (Addendum)
If you are age 41 or younger, your body mass index should be between 19-25. Your Body mass index is 37.56 kg/m. If this is out of the aformentioned range listed, please consider follow up with your Primary Care Provider.   Your provider has requested that you go to the basement level for lab work before leaving today. Press "B" on the elevator. The lab is located at the first door on the left as you exit the elevator.  You have been scheduled for an endoscopy and colonoscopy. Please follow the written instructions given to you at your visit today. Please pick up your prep supplies at the pharmacy within the next 1-3 days. If you use inhalers (even only as needed), please bring them with you on the day of your procedure.  Due to recent changes in healthcare laws, you may see the results of your imaging and laboratory studies on MyChart before your provider has had a chance to review them.  We understand that in some cases there may be results that are confusing or concerning to you. Not all laboratory results come back in the same time frame and the provider may be waiting for multiple results in order to interpret others.  Please give [place] 48 hours in order for your provider to thoroughly review all the results before contacting the office for clarification of your results.   We have referred you to [facility].  Someone from their office should contact you with an appointment.  If you have not heard anything from their office in 2 weeks, please contact our office at [phone].  Thank you for entrusting me with your care and choosing [facility].  Dr Rush Landmark

## 2020-10-12 NOTE — Progress Notes (Signed)
[place] VISIT   Primary Care Provider Agency, [facility] [place] [address] [phone]  Referring Provider Dr. Vicente Males  Patient Profile: Rachel Wall is a 40 y.o. female with a pmh significant for anxiety/MDD, pulmonary arterial hypertension, bradycardia (status post PPM), status post hysterectomy, nephrolithiasis, adenomatous and serrated colon polyps, family history colon cancer (paternal grandfather and sister), family history stomach cancer (paternal aunt), GERD.  The patient presents to the [facility] for an evaluation and management of problem(s) noted below:  Problem List 1. Adenomatous polyp of transverse colon   2. History of colonic polyps   3. Family history of colon cancer   4. Bloating   5. Pyrosis   6. Family history of celiac disease     History of Present Illness This is the patient's first visit to the outpatient [facility].  The patient's primary gastroenterologist is Dr. Vicente Males of [facility].  The patient is referred after finding in November 2021 of a large transverse colon polyp that did not lift for consideration of advanced resection.  The patient's history is interesting however due to a significant family history of colon cancer.  Her paternal grandfather had colon cancer and her younger sister has had colon cancer as well.  There is no diagnosis of Lynch syndrome or FAP that has been noted and the patient states that her sister was able to undergo genetics testing was not found to have any abnormalities.  She herself has never had genetic testing.  Patient also describes a prior history of acid reflux and pyrosis.  She is not had significant issues with dysphagia.  She does have bloating as well as chronic diarrhea.  She has had hemorrhoids in the past with very scant bleeding in the past as well.  Patient has not had an upper endoscopy in years but  wonders if that is possible to be performed due to her persistent symptoms.  GI Review of Systems Positive as above Negative for pain, early satiety, anorexia, change in bowel habits, melena  Review of Systems General: Denies fevers/chills/weight loss unintentionally Cardiovascular: Denies current chest pain/palpitations (she has a PPM in place) Pulmonary: Shortness of breath is stable Gastroenterological: See HPI Genitourinary: Denies darkened urine Hematological: Denies easy bruising/bleeding Dermatological: Denies jaundice Psychological: Mood is stable   Medications Current Outpatient Medications  Medication Sig Dispense Refill  . albuterol (VENTOLIN HFA) 108 (90 Base) MCG/ACT inhaler Inhale 2 puffs into the lungs every 6 (six) hours as needed for wheezing or shortness of breath.     Marland Kitchen ambrisentan (LETAIRIS) 10 MG tablet Take 10 mg by mouth daily.     . citalopram (CELEXA) 40 MG tablet Take 40 mg by mouth daily.    Marland Kitchen diltiazem (CARDIZEM) 120 MG tablet Take 120-240 mg by mouth See admin instructions. Take 120 mg by mouth in the morning and take 240 mg by mouth at bedtime    . famotidine (PEPCID) 20 MG tablet Take 40 mg by mouth 2 (two) times daily.    . furosemide (LASIX) 20 MG tablet Take 20 mg by mouth daily.    . Na Sulfate-K Sulfate-Mg Sulf (SUPREP BOWEL PREP KIT) 17.5-3.13-1.6 GM/177ML SOLN Take 1 kit by mouth as directed. 324 mL 0  . Selexipag 1200 MCG TABS Take 1,200 mcg by mouth 2 (two) times daily.     . tadalafil, PAH, (ADCIRCA) 20 MG tablet     . traZODone (DESYREL) 50  MG tablet Take 3 tablets (150 mg total) by mouth at bedtime as needed for sleep. 90 tablet 2   No current facility-administered medications for this visit.    Allergies Allergies  Allergen Reactions  . Cyclosporine Other (See Comments)    Medication interactions  . Nitroglycerin Other (See Comments)    Medication interactions  . Other Other (See Comments)    Muscle relaxers - per patient interacts  with other medications she is on.  . Tizanidine     Interaction with heart and lung medications  . Adhesive [Tape] Rash  . Tapentadol Rash    Histories Past Medical History:  Diagnosis Date  . Anxiety   . 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 Depression   . Family history of breast cancer   . Family history of ovarian cancer   . GERD (gastroesophageal reflux disease)   . History of kidney stones   . Presence of permanent cardiac pacemaker   . Pulmonary hypertension ([place])   . Tachycardia    Past Surgical History:  Procedure Laterality Date  . COLONOSCOPY WITH PROPOFOL N/A 08/04/2020   Procedure: COLONOSCOPY WITH PROPOFOL;  Surgeon: Jonathon Bellows, MD;  Location: [facility] ENDOSCOPY;  Service: Gastroenterology;  Laterality: N/A;  . COLONOSCOPY WITH PROPOFOL N/A 08/11/2020   Procedure: COLONOSCOPY WITH PROPOFOL;  Surgeon: Jonathon Bellows, MD;  Location: [facility] ENDOSCOPY;  Service: Gastroenterology;  Laterality: N/A;  . CYSTOSCOPY  05/12/2019   Procedure: CYSTOSCOPY;  Surgeon: Will Bonnet, MD;  Location: [facility] ORS;  Service: Gynecology;;  . heart monitor implant and removal    . INSERT / REPLACE / REMOVE PACEMAKER    . LITHOTRIPSY  2012  . PACEMAKER INSERTION  2013  . STENT REMOVAL    . TOTAL LAPAROSCOPIC HYSTERECTOMY WITH SALPINGECTOMY Bilateral 05/12/2019   Procedure: TOTAL LAPAROSCOPIC HYSTERECTOMY;  Surgeon: Will Bonnet, MD;  Location: [facility] ORS;  Service: Gynecology;  Laterality: Bilateral;  . TUBAL LIGATION  10/10/2011   Social History   Socioeconomic History  . Marital status: Married    Spouse name: Not on file  . Number of children: Not on file  . Years of education: Not on file  . Highest education level: Not on file  Occupational History  . Not on file  Tobacco Use  . Smoking status: Former Smoker    Packs/day: 0.50    Types: Cigarettes  . Smokeless tobacco: Never Used  Vaping Use  . Vaping Use: Never used  Substance and Sexual Activity  . Alcohol use: Not  Currently  . Drug use: Not Currently  . Sexual activity: Yes    Birth control/protection: Surgical    Comment: Tubal ligation/Hysterectomy  Other Topics Concern  . Not on file  Social History Narrative  . Not on file   Social Determinants of Health   Financial Resource Strain: Not on file  Food Insecurity: Not on file  Transportation Needs: Not on file  Physical Activity: Not on file  Stress: Not on file  Social Connections: Not on file  Intimate Partner Violence: Not on file   Family History  Problem Relation Age of Onset  . Healthy Mother   . Lung disease Father   . Heart disease Father   . Diabetes Father   . Hypertension Father   . Kidney cancer Father   . Colon cancer Paternal Grandfather   . Colon cancer Sister 3  . Breast cancer Paternal Aunt   . Ovarian cancer Paternal Aunt   . Stomach cancer  Paternal Aunt   . Inflammatory bowel disease Neg Hx   . Liver disease Neg Hx   . Pancreatic cancer Neg Hx    I have reviewed her medical, social, and family history in detail and updated the electronic medical record as necessary.    PHYSICAL EXAMINATION  BP 108/72   Pulse 84   Ht _0  (1.727 m)   Wt 247 lb (112 kg)   LMP 04/14/2019 (Exact Date)   SpO2 95%   BMI 37.56 kg/m  Wt Readings from Last 3 Encounters:  10/12/20 247 lb (112 kg)  08/11/20 250 lb (113.4 kg)  08/04/20 250 lb (113.4 kg)  GEN: NAD, appears stated age, doesn't appear chronically ill PSYCH: Cooperative, without pressured speech EYE: Conjunctivae pink, sclerae anicteric ENT: Masked NECK: Enlarged neck girth CV: Nontachycardic RESP: No audible wheezing GI: NABS, soft, rounded, protuberant NT/ND, without rebound MSK/EXT: Lower extremity edema present SKIN: No jaundice NEURO:  Alert & Oriented x 3, no focal deficits   REVIEW OF DATA  I reviewed the following data at the time of this encounter:  GI Procedures and Studies  November 2021 colonoscopy - Preparation of the colon was fair. -  One 10 mm polyp in the distal transverse colon, removed with a cold snare. Resected and retrieved. Clips were placed. - One 15 mm polyp in the distal transverse colon, removed with mucosal resection. Resected and retrieved. Clips were placed. - One 30 mm polyp in the proximal transverse colon. Treatment not successful. - Mucosal ulceration. - The examination was otherwise normal on direct and retroflexion views. - Mucosal resection was performed. Resection and retrieval were complete. Pathology DIAGNOSIS:  A. COLON POLYP X2, TRANSVERSE; HOT SNARE (1) AND COLD SNARE (1):  - SESSILE SERRATED POLYP (MULTIPLE FRAGMENTS).  - NEGATIVE FOR DYSPLASIA AND MALIGNANCY.   Laboratory Studies  Reviewed those in epic  Imaging Studies  No relevant studies to review   ASSESSMENT  Ms. Minteer is a 40 y.o. female with a pmh significant for anxiety/MDD, pulmonary arterial hypertension, bradycardia (status post PPM), status post hysterectomy, nephrolithiasis, adenomatous and serrated colon polyps, family history colon cancer (paternal grandfather and sister), family history stomach cancer (paternal aunt), GERD.  The patient is seen today for evaluation and management of:  1. Adenomatous polyp of transverse colon   2. History of colonic polyps   3. Family history of colon cancer   4. Bloating   5. Pyrosis   6. Family history of celiac disease    The patient is hemodynamically stable.  Based upon the description and endoscopic pictures I do feel that it is reasonable to pursue an Advanced Polypectomy attempt of the polyp/lesion.  We discussed some of the techniques of advanced polypectomy which include Endoscopic Mucosal Resection, OVESCO Full-Thickness Resection, Endorotor Morcellation, and Tissue Ablation via Fulguration.  We also reviewed images of typical techniques as noted above.  The risks and benefits of endoscopic evaluation were discussed with the patient; these include but are not limited to the risk  of perforation, infection, bleeding, missed lesions, lack of diagnosis, severe illness requiring hospitalization, as well as anesthesia and sedation related illnesses.  During attempts at advanced resection, the risks of bleeding and perforation/leak are increased as opposed to diagnostic and screening procedures, and that was discussed with the patient as well.   In addition, I explained that with the possible need for piecemeal resection, subsequent short-interval endoscopic evaluation for follow up and potential retreatment of the lesion/area may be necessary.  I  did offer, a referral to surgery in order for patient to have opportunity to discuss surgical management/intervention prior to finalizing decision for attempt at endoscopic removal, however, the patient deferred on this.  If, after attempt at removal of the polyp/lesion, it is found that the patient has a complication or that an invasive lesion or malignant lesion is found, or that the polyp/lesion continues to recur, the patient is aware and understands that surgery may still be indicated/required.  In regards to the patient's overall family history of colon cancer I do have some concern for the potential of having underlying Lynch syndrome although she does not have successive generations with colon cancer.  Medical genetics referral was discussed with the patient and she would like to move forward with that as long as her insurance covers that so we will place that referral today.  Due to her upper GI symptoms diagnostic endoscopy will be recommended as well and we will pursue that at the same time as her planned colonoscopy.  All patient questions were answered, to the best of my ability, and the patient agrees to the aforementioned plan of action with follow-up as indicated.   PLAN  Preprocedure labs as outlined below Celiac testing due to family history of sister with celiac to be performed and some upper GI symptoms Colonoscopy with EMR  already scheduled -2-day preparation needed with 1 week of MiraLAX daily as well Add on EGD due to persistent symptoms on therapy to evaluate and ensure nothing is missed such as Barrett's esophagus   Orders Placed This Encounter  Procedures  . CBC  . Basic metabolic panel  . Tissue transglutaminase, IgA  . INR/PT  . IgA  . Ambulatory referral to Genetics    New Prescriptions   NA SULFATE-K SULFATE-MG SULF (SUPREP BOWEL PREP KIT) 17.5-3.13-1.6 GM/177ML SOLN    Take 1 kit by mouth as directed.   Modified Medications   No medications on file    Planned Follow Up No follow-ups on file.   Total Time in Face-to-Face and in Coordination of Care for patient including independent/personal interpretation/review of prior testing, medical history, examination, medication adjustment, communicating results with the patient directly, and documentation with the EHR is 45 minutes.   Justice Britain, MD [facility] Advanced [facility]oscopy Office # [phone]

## 2020-10-12 NOTE — H&P (View-Only) (Signed)
[place] VISIT   Primary Care Provider Agency, [facility] [place] [address] [phone]  Referring Provider Dr. Vicente Males  Patient Profile: Rachel Wall is a 40 y.o. female with a pmh significant for anxiety/MDD, pulmonary arterial hypertension, bradycardia (status post PPM), status post hysterectomy, nephrolithiasis, adenomatous and serrated colon polyps, family history colon cancer (paternal grandfather and sister), family history stomach cancer (paternal aunt), GERD.  The patient presents to the [facility] for an evaluation and management of problem(s) noted below:  Problem List 1. Adenomatous polyp of transverse colon   2. History of colonic polyps   3. Family history of colon cancer   4. Bloating   5. Pyrosis   6. Family history of celiac disease     History of Present Illness This is the patient's first visit to the outpatient [facility] clinic.  The patient's primary gastroenterologist is Dr. Vicente Males of [facility].  The patient is referred after finding in November 2021 of a large transverse colon polyp that did not lift for consideration of advanced resection.  The patient's history is interesting however due to a significant family history of colon cancer.  Her paternal grandfather had colon cancer and her younger sister has had colon cancer as well.  There is no diagnosis of Lynch syndrome or FAP that has been noted and the patient states that her sister was able to undergo genetics testing was not found to have any abnormalities.  She herself has never had genetic testing.  Patient also describes a prior history of acid reflux and pyrosis.  She is not had significant issues with dysphagia.  She does have bloating as well as chronic diarrhea.  She has had hemorrhoids in the past with very scant bleeding in the past as well.  Patient has not had an upper endoscopy in years but  wonders if that is possible to be performed due to her persistent symptoms.  GI Review of Systems Positive as above Negative for pain, early satiety, anorexia, change in bowel habits, melena  Review of Systems General: Denies fevers/chills/weight loss unintentionally Cardiovascular: Denies current chest pain/palpitations (she has a PPM in place) Pulmonary: Shortness of breath is stable Gastroenterological: See HPI Genitourinary: Denies darkened urine Hematological: Denies easy bruising/bleeding Dermatological: Denies jaundice Psychological: Mood is stable   Medications Current Outpatient Medications  Medication Sig Dispense Refill  . albuterol (VENTOLIN HFA) 108 (90 Base) MCG/ACT inhaler Inhale 2 puffs into the lungs every 6 (six) hours as needed for wheezing or shortness of breath.     Marland Kitchen ambrisentan (LETAIRIS) 10 MG tablet Take 10 mg by mouth daily.     . citalopram (CELEXA) 40 MG tablet Take 40 mg by mouth daily.    Marland Kitchen diltiazem (CARDIZEM) 120 MG tablet Take 120-240 mg by mouth See admin instructions. Take 120 mg by mouth in the morning and take 240 mg by mouth at bedtime    . famotidine (PEPCID) 20 MG tablet Take 40 mg by mouth 2 (two) times daily.    . furosemide (LASIX) 20 MG tablet Take 20 mg by mouth daily.    . Na Sulfate-K Sulfate-Mg Sulf (SUPREP BOWEL PREP KIT) 17.5-3.13-1.6 GM/177ML SOLN Take 1 kit by mouth as directed. 324 mL 0  . Selexipag 1200 MCG TABS Take 1,200 mcg by mouth 2 (two) times daily.     . tadalafil, PAH, (ADCIRCA) 20 MG tablet     . traZODone (DESYREL) 50  MG tablet Take 3 tablets (150 mg total) by mouth at bedtime as needed for sleep. 90 tablet 2   No current facility-administered medications for this visit.    Allergies Allergies  Allergen Reactions  . Cyclosporine Other (See Comments)    Medication interactions  . Nitroglycerin Other (See Comments)    Medication interactions  . Other Other (See Comments)    Muscle relaxers - per patient interacts  with other medications she is on.  . Tizanidine     Interaction with heart and lung medications  . Adhesive [Tape] Rash  . Tapentadol Rash    Histories Past Medical History:  Diagnosis Date  . Anxiety   . 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 Depression   . Family history of breast cancer   . Family history of ovarian cancer   . GERD (gastroesophageal reflux disease)   . History of kidney stones   . Presence of permanent cardiac pacemaker   . Pulmonary hypertension ([place])   . Tachycardia    Past Surgical History:  Procedure Laterality Date  . COLONOSCOPY WITH PROPOFOL N/A 08/04/2020   Procedure: COLONOSCOPY WITH PROPOFOL;  Surgeon: Jonathon Bellows, MD;  Location: [facility] ENDOSCOPY;  Service: Gastroenterology;  Laterality: N/A;  . COLONOSCOPY WITH PROPOFOL N/A 08/11/2020   Procedure: COLONOSCOPY WITH PROPOFOL;  Surgeon: Jonathon Bellows, MD;  Location: [facility] ENDOSCOPY;  Service: Gastroenterology;  Laterality: N/A;  . CYSTOSCOPY  05/12/2019   Procedure: CYSTOSCOPY;  Surgeon: Will Bonnet, MD;  Location: [facility] ORS;  Service: Gynecology;;  . heart monitor implant and removal    . INSERT / REPLACE / REMOVE PACEMAKER    . LITHOTRIPSY  2012  . PACEMAKER INSERTION  2013  . STENT REMOVAL    . TOTAL LAPAROSCOPIC HYSTERECTOMY WITH SALPINGECTOMY Bilateral 05/12/2019   Procedure: TOTAL LAPAROSCOPIC HYSTERECTOMY;  Surgeon: Will Bonnet, MD;  Location: [facility] ORS;  Service: Gynecology;  Laterality: Bilateral;  . TUBAL LIGATION  10/10/2011   Social History   Socioeconomic History  . Marital status: Married    Spouse name: Not on file  . Number of children: Not on file  . Years of education: Not on file  . Highest education level: Not on file  Occupational History  . Not on file  Tobacco Use  . Smoking status: Former Smoker    Packs/day: 0.50    Types: Cigarettes  . Smokeless tobacco: Never Used  Vaping Use  . Vaping Use: Never used  Substance and Sexual Activity  . Alcohol use: Not  Currently  . Drug use: Not Currently  . Sexual activity: Yes    Birth control/protection: Surgical    Comment: Tubal ligation/Hysterectomy  Other Topics Concern  . Not on file  Social History Narrative  . Not on file   Social Determinants of Health   Financial Resource Strain: Not on file  Food Insecurity: Not on file  Transportation Needs: Not on file  Physical Activity: Not on file  Stress: Not on file  Social Connections: Not on file  Intimate Partner Violence: Not on file   Family History  Problem Relation Age of Onset  . Healthy Mother   . Lung disease Father   . Heart disease Father   . Diabetes Father   . Hypertension Father   . Kidney cancer Father   . Colon cancer Paternal Grandfather   . Colon cancer Sister 3  . Breast cancer Paternal Aunt   . Ovarian cancer Paternal Aunt   . Stomach cancer  Paternal Aunt   . Inflammatory bowel disease Neg Hx   . Liver disease Neg Hx   . Pancreatic cancer Neg Hx    I have reviewed her medical, social, and family history in detail and updated the electronic medical record as necessary.    PHYSICAL EXAMINATION  BP 108/72   Pulse 84   Ht _0  (1.727 m)   Wt 247 lb (112 kg)   LMP 04/14/2019 (Exact Date)   SpO2 95%   BMI 37.56 kg/m  Wt Readings from Last 3 Encounters:  10/12/20 247 lb (112 kg)  08/11/20 250 lb (113.4 kg)  08/04/20 250 lb (113.4 kg)  GEN: NAD, appears stated age, doesn't appear chronically ill PSYCH: Cooperative, without pressured speech EYE: Conjunctivae pink, sclerae anicteric ENT: Masked NECK: Enlarged neck girth CV: Nontachycardic RESP: No audible wheezing GI: NABS, soft, rounded, protuberant NT/ND, without rebound MSK/EXT: Lower extremity edema present SKIN: No jaundice NEURO:  Alert & Oriented x 3, no focal deficits   REVIEW OF DATA  I reviewed the following data at the time of this encounter:  GI Procedures and Studies  November 2021 colonoscopy - Preparation of the colon was fair. -  One 10 mm polyp in the distal transverse colon, removed with a cold snare. Resected and retrieved. Clips were placed. - One 15 mm polyp in the distal transverse colon, removed with mucosal resection. Resected and retrieved. Clips were placed. - One 30 mm polyp in the proximal transverse colon. Treatment not successful. - Mucosal ulceration. - The examination was otherwise normal on direct and retroflexion views. - Mucosal resection was performed. Resection and retrieval were complete. Pathology DIAGNOSIS:  A. COLON POLYP X2, TRANSVERSE; HOT SNARE (1) AND COLD SNARE (1):  - SESSILE SERRATED POLYP (MULTIPLE FRAGMENTS).  - NEGATIVE FOR DYSPLASIA AND MALIGNANCY.   Laboratory Studies  Reviewed those in epic  Imaging Studies  No relevant studies to review   ASSESSMENT  Rachel Wall is a 40 y.o. female with a pmh significant for anxiety/MDD, pulmonary arterial hypertension, bradycardia (status post PPM), status post hysterectomy, nephrolithiasis, adenomatous and serrated colon polyps, family history colon cancer (paternal grandfather and sister), family history stomach cancer (paternal aunt), GERD.  The patient is seen today for evaluation and management of:  1. Adenomatous polyp of transverse colon   2. History of colonic polyps   3. Family history of colon cancer   4. Bloating   5. Pyrosis   6. Family history of celiac disease    The patient is hemodynamically stable.  Based upon the description and endoscopic pictures I do feel that it is reasonable to pursue an Advanced Polypectomy attempt of the polyp/lesion.  We discussed some of the techniques of advanced polypectomy which include Endoscopic Mucosal Resection, OVESCO Full-Thickness Resection, Endorotor Morcellation, and Tissue Ablation via Fulguration.  We also reviewed images of typical techniques as noted above.  The risks and benefits of endoscopic evaluation were discussed with the patient; these include but are not limited to the risk  of perforation, infection, bleeding, missed lesions, lack of diagnosis, severe illness requiring hospitalization, as well as anesthesia and sedation related illnesses.  During attempts at advanced resection, the risks of bleeding and perforation/leak are increased as opposed to diagnostic and screening procedures, and that was discussed with the patient as well.   In addition, I explained that with the possible need for piecemeal resection, subsequent short-interval endoscopic evaluation for follow up and potential retreatment of the lesion/area may be necessary.  I  did offer, a referral to surgery in order for patient to have opportunity to discuss surgical management/intervention prior to finalizing decision for attempt at endoscopic removal, however, the patient deferred on this.  If, after attempt at removal of the polyp/lesion, it is found that the patient has a complication or that an invasive lesion or malignant lesion is found, or that the polyp/lesion continues to recur, the patient is aware and understands that surgery may still be indicated/required.  In regards to the patient's overall family history of colon cancer I do have some concern for the potential of having underlying Lynch syndrome although she does not have successive generations with colon cancer.  Medical genetics referral was discussed with the patient and she would like to move forward with that as long as her insurance covers that so we will place that referral today.  Due to her upper GI symptoms diagnostic endoscopy will be recommended as well and we will pursue that at the same time as her planned colonoscopy.  All patient questions were answered, to the best of my ability, and the patient agrees to the aforementioned plan of action with follow-up as indicated.   PLAN  Preprocedure labs as outlined below Celiac testing due to family history of sister with celiac to be performed and some upper GI symptoms Colonoscopy with EMR  already scheduled -2-day preparation needed with 1 week of MiraLAX daily as well Add on EGD due to persistent symptoms on therapy to evaluate and ensure nothing is missed such as Barrett's esophagus   Orders Placed This Encounter  Procedures  . CBC  . Basic metabolic panel  . Tissue transglutaminase, IgA  . INR/PT  . IgA  . Ambulatory referral to Genetics    New Prescriptions   NA SULFATE-K SULFATE-MG SULF (SUPREP BOWEL PREP KIT) 17.5-3.13-1.6 GM/177ML SOLN    Take 1 kit by mouth as directed.   Modified Medications   No medications on file    Planned Follow Up No follow-ups on file.   Total Time in Face-to-Face and in Coordination of Care for patient including independent/personal interpretation/review of prior testing, medical history, examination, medication adjustment, communicating results with the patient directly, and documentation with the EHR is 45 minutes.   Justice Britain, MD [facility]oscopy Office # [phone]

## 2020-10-13 LAB — IGA: Immunoglobulin A: 108 mg/dL (ref 47–310)

## 2020-10-13 LAB — TISSUE TRANSGLUTAMINASE, IGA: (tTG) Ab, IgA: <1 U/mL

## 2020-10-15 ENCOUNTER — Encounter: Payer: Self-pay | Admitting: Gastroenterology

## 2020-10-17 ENCOUNTER — Other Ambulatory Visit (HOSPITAL_COMMUNITY): Payer: Medicare Other

## 2020-10-18 ENCOUNTER — Other Ambulatory Visit: Payer: Self-pay

## 2020-10-18 ENCOUNTER — Encounter (HOSPITAL_COMMUNITY): Payer: Self-pay | Admitting: Gastroenterology

## 2020-10-18 ENCOUNTER — Other Ambulatory Visit (HOSPITAL_COMMUNITY)
Admission: RE | Admit: 2020-10-18 | Discharge: 2020-10-18 | Disposition: A | Discharge status: Home / Self Care | Payer: Medicare Other | Source: Ambulatory Visit | Attending: Gastroenterology | Admitting: Gastroenterology

## 2020-10-18 DIAGNOSIS — Z20822 Contact with and (suspected) exposure to covid-19: Secondary | ICD-10-CM | POA: Diagnosis not present

## 2020-10-18 DIAGNOSIS — Z01812 Encounter for preprocedural laboratory examination: Secondary | ICD-10-CM | POA: Diagnosis present

## 2020-10-18 LAB — SARS CORONAVIRUS 2 (TAT 6-24 HRS): SARS Coronavirus 2: NEGATIVE

## 2020-10-18 NOTE — Progress Notes (Signed)
Mrs Rachel Wall denies chest pain or shortness of breath at this time.  Ms Rachel Wall has Pulmonary Hypertension and wears oxygen at night; Oxygen saturation drops to 80's when she lies down. Mrs. Rachel Wall denies signs /symptoms of Covid, patient was just tested and will go home and quarantine, until Thursday.

## 2020-10-19 NOTE — Progress Notes (Signed)
Anesthesia Chart Review: SAME DAY WORK-UP   Case: [id number] Date/Time: 10/20/20 0730   Procedures:      COLONOSCOPY WITH PROPOFOL (N/A )     ENDOSCOPIC MUCOSAL RESECTION (N/A )     ESOPHAGOGASTRODUODENOSCOPY (EGD) WITH PROPOFOL (N/A )   Anesthesia type: Monitor Anesthesia Care   Pre-op diagnosis: large polyp   Location: [facility]   Surgeons: Mansouraty, Telford Nab., MD      DISCUSSION: Patient is a 39 year old female scheduled for the above procedure. She underwent colonoscopy on 08/11/20 with several polyp resections; however, treatment for a 30 mm polyp in the proximal transverse colon was not successful. Patient was referred to Dr. Rush Landmark for resection of large polyp.   History includes former smoker (quit 07/01/20), idiopathic pulmonary hypertension (diagnosed 2012 in [place] while pregnant; started on nocturnal O2 ~ 05/2020), dyspnea, dysrhythmia, sinus node dysfunction (diagnosed 2009; s/p "Medtronic Revo Model Number: RVDR01 Serial Number: [id number]" 06/05/2012 in [place]), migraines, anxiety, hysterectomy (05/12/19), obesity.   Pulmonary hypertension is managed by [facility] pulmonologist Dr. Harrietta Guardian. Last visit 08/05/20 following "reassuring hemodynamics on recent right heart catheterization".  Obesity likely playing a role in her continued symptomatic burden with activity.  She was referred to a bariatric team. Continue current [place] regiment (talalafil, ambrisentan, selexipag, furosemide). She was started on nocturnal O2 ~ August and was changed to 4L humidification with facemask due to epistaxis/nasal irritation. She remained undecided regarding COVID-19 vaccines. Three month follow-up planned.   Last cardiology visit with Victorino Sparrow, Rexford Maus on 05/02/20. Per 05/02/20 PPM interrogation [facility] Everywhere): "Normal pacemaker function A pace - 29.6% V pace - <0.1% Events - none Battery - 2.9V (ERI 2.81V) Underlying Rhythm - sb Continue remotes".    10/18/2020 preprocedure  COVID-19 test negative.  Anesthesia team to evaluate on the day of procedure.  06/02/2020 EKG requested from [facility].  She had CBC, BMET, PT/INR, IgA, and tissue transglutaminase IgA on 10/12/20 per GI.    VS: LMP 04/14/2019 (Exact Date)   Wt Readings from Last 3 Encounters:  10/12/20 112 kg  08/11/20 113.4 kg  08/04/20 113.4 kg   BP Readings from Last 3 Encounters:  10/12/20 108/72  08/11/20 118/72  08/04/20 111/64   Pulse Readings from Last 3 Encounters:  10/12/20 84  08/11/20 83  08/04/20 68     PROVIDERS: Daphane Shepherd, MD is pulmonologist ([facility]) Jonathon Bellows, MD is primary GI Nolon Stalls, MD is cardiologist ([place])   LABS: Most recent lab results include: Lab Results  Component Value Date   WBC 8.5 10/12/2020   HGB 13.9 10/12/2020   HCT 41.0 10/12/2020   PLT 176.0 10/12/2020   GLUCOSE 100 (H) 10/12/2020   ALT 13 05/08/2019   AST 12 (L) 05/08/2019   NA 137 10/12/2020   K 4.1 10/12/2020   CL 108 10/12/2020   CREATININE 0.67 10/12/2020   BUN 10 10/12/2020   CO2 23 10/12/2020   INR 1.0 10/12/2020   A1c 5.1% on 07/01/20 [facility] CE). BNP 50.51 on 08/05/20 [facility]).   OTHER: Colonoscopy 08/11/20: Impression: - Preparation of the colon was fair. - One 10 mm polyp in the distal transverse colon, removed with a cold snare. Resected and retrieved. Clips were placed. - One 15 mm polyp in the distal transverse colon, removed with mucosal resection. Resected and retrieved. Clips were placed. - One 30 mm polyp in the proximal transverse colon. Treatment not successful. - Mucosal ulceration. -  The examination was otherwise normal on direct and retroflexion views. - Mucosal resection was performed. Resection and retrieval were complete.   Overnight pulse oximetry 05/18/20 [facility]): Results not viewable in [place], but based on results, Dr. Harrietta Guardian started patient on 2L nocturnal O2. At 08/05/20, she wrote, "continue  nocturnal O2; given epistaxis and nasal irritation will try increase flow to 4LPM with facemask, as well as humidification." ([place])   6 minute walk tests (as outlined in 08/05/20 [facility] pulmonology note):  6MWT 05/2020: 482 meters. HR 72->148, O2 sat 98->96% RA. Max Borg 5. 6MWT 08/2018: 351 meters. HR 74->132, O2 sat 97%->97%. Max Borg 0. 6MWT 08/2017: 440 meters. HR max 140, O2 sat min 95%, BP pre 115/69 and post 139/78   PFTs (as outlined in 08/05/20 [facility] pulmonology note) Pulmonary function testing: Date FEV1 FVC TLC FRC RV DLCO comment  08/2018 3.14 (90%) 4.33 (101%) (68%)  07/2017 2.61 (72%) 3.66 (83%) 79% 56% 17.2 (66%) +17% FEV1 w BD  05/2016 73% 29% 16.7 (57%     EKG: 06/02/20 [facility]): Tracing requested from [facility]. By result narrative, EKG showed: ATRIAL-PACED RHYTHM WITH PROLONGED AV CONDUCTION  RIGHTWARD AXIS  ABNORMAL ECG  WHEN COMPARED WITH ECG OF 23-Apr-2019 16:54,  NO SIGNIFICANT CHANGE WAS FOUND  Confirmed by Shara Blazing ([id number]) on 06/03/2020 9:54:35 AM    CV: RHC 06/02/20 ([facility] CE): Hemodynamics:  Heart rate: 66  Systemic BP: 120/74  Mean arterial pressure: 96    RA a wave: 8  RA v wave: 6  RA mean: 5    RV (s/d/ed): 50/4/6    PA: 50/25      PA mean: 34    PCW a wave: 13  PCW v wave: 13  PCW mean: 13    PA sat: 95%          Arterial sat: 68%    Hemoglobin: 15 g/dL  Body surface area: 2.21 m2    Cardiac Output 5.33 L/min (Fick)  Cardiac Index 2.41 L/min/m2 (Fick)    Cardiac Output 7.27 L/min (Thermodilution)  Cardiac Index 3.29 L/min/m2 (Thermodilution)  Stroke volume index: 52.5 mL/beat/m2    PVR: 3.94 Wood Units/ 315 dyn-sec/cm5 (Fick)  SVR: 1370 dyn-sec/cm5 (Fick)    PVR: 2.89 Wood Units/ 231 dyn-sec/cm5 (Thermodilution)  SVR: 1000 dyn-sec/cm5 (Thermodilution)  PAC: 4.4 mL/mmHg    Conclusions:   Pulmonary arterial hypertension (mPAP 34, PVR 2.9 WU). Hemodynamics at  goal for treated [facility] patient.    Normal cardiac output/index.   Normal RA and PCW pressures.   In comparison to prior [place] from 08/2017 (report only), findings are  similar.    Plan:   Continue triple combination therapy with tadalafil, ambrisentan, and  selexipag. Okay for home today.    Echo 05/02/20 [facility]): Summary  1. The left ventricle is normal in size with normal wall thickness.  2. The left ventricular systolic function is normal, LVEF is visually  estimated at > 55%.  3. There is mild mitral valve regurgitation.  4. The left atrium is mildly dilated in size.  5. The right ventricle is mildly dilated in size, with normal systolic  function.  6. There is no pulmonary hypertension, estimated pulmonary artery systolic  pressure is 32 mmHg.  7. There is mild to moderate pulmonic regurgitation.  8. The right atrium is mildly dilated in size.  9. Dilated pulmonary artery - mild.  10. Dilated pulmonary artery - mild.    Past Medical History:  Diagnosis Date  .  Anxiety   . Arthritis   . 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 Depression   . Dyspnea   . Dysrhythmia   . Family history of breast cancer   . Family history of ovarian cancer   . GERD (gastroesophageal reflux disease)   . Headache    Migraines  . History of kidney stones   . On home oxygen therapy    4 l at night- Oxygen sat - 80's  . Presence of permanent cardiac pacemaker   . Pulmonary hypertension ([place])   . Tachycardia     Past Surgical History:  Procedure Laterality Date  . ABDOMINAL HYSTERECTOMY    . COLONOSCOPY WITH PROPOFOL N/A 08/04/2020   Procedure: COLONOSCOPY WITH PROPOFOL;  Surgeon: Jonathon Bellows, MD;  Location: [facility] ENDOSCOPY;  Service: Gastroenterology;  Laterality: N/A;  . COLONOSCOPY WITH PROPOFOL N/A 08/11/2020   Procedure: COLONOSCOPY WITH PROPOFOL;  Surgeon: Jonathon Bellows, MD;  Location: [facility] ENDOSCOPY;  Service: Gastroenterology;  Laterality: N/A;  . CYSTOSCOPY  05/12/2019   Procedure: CYSTOSCOPY;   Surgeon: Will Bonnet, MD;  Location: [facility] ORS;  Service: Gynecology;;  . Consuela Mimes    . heart monitor implant and removal    . INSERT / REPLACE / REMOVE PACEMAKER    . LITHOTRIPSY  2012  . PACEMAKER INSERTION  2013  . STENT REMOVAL    . TOTAL LAPAROSCOPIC HYSTERECTOMY WITH SALPINGECTOMY Bilateral 05/12/2019   Procedure: TOTAL LAPAROSCOPIC HYSTERECTOMY;  Surgeon: Will Bonnet, MD;  Location: [facility] ORS;  Service: Gynecology;  Laterality: Bilateral;  . TUBAL LIGATION  10/10/2011    MEDICATIONS: No current facility-administered medications for this encounter.   Marland Kitchen albuterol (VENTOLIN HFA) 108 (90 Base) MCG/ACT inhaler  . ambrisentan (LETAIRIS) 10 MG tablet  . amoxicillin-clavulanate (AUGMENTIN) 875-125 MG tablet  . benzonatate (TESSALON) 100 MG capsule  . citalopram (CELEXA) 40 MG tablet  . diltiazem (CARDIZEM) 120 MG tablet  . famotidine (PEPCID) 20 MG tablet  . furosemide (LASIX) 20 MG tablet  . Selexipag 1200 MCG TABS  . tadalafil, PAH, (ADCIRCA) 20 MG tablet  . traZODone (DESYREL) 50 MG tablet  . APO-VARENICLINE 0.5 MG tablet  . Na Sulfate-K Sulfate-Mg Sulf (SUPREP BOWEL PREP KIT) 17.5-3.13-1.6 GM/177ML SOLN    Myra Gianotti, PA-C Surgical Short Stay/Anesthesiology [facility] Phone [phone] [facility] Phone [phone] 10/19/2020 1:34 PM

## 2020-10-19 NOTE — Anesthesia Preprocedure Evaluation (Addendum)
Anesthesia Evaluation  Patient identified by MRN, date of birth, ID band Patient awake    Reviewed: Allergy & Precautions, NPO status , Patient's Chart, lab work & pertinent test results  Airway Mallampati: II  TM Distance: >3 FB Neck ROM: Full    Dental  (+) Missing, Dental Advisory Given,    Pulmonary shortness of breath and with exertion, COPD,  oxygen dependent, former smoker,  Home oxygen 4LPM at night  idiopathic pulmonary hypertension (diagnosed 2012 in [place] while pregnant; started on nocturnal O2 ~ 05/2020)- managed by [facility] pulmonologist Dr. Harrietta Guardian. Last visit 08/05/20 following "reassuring hemodynamics on recent right heart catheterization".  Obesity likely playing a role in her continued symptomatic burden with activity.  She was referred to a bariatric team. Continue current [place] regiment (talalafil, ambrisentan, selexipag, furosemide). She was started on nocturnal O2 ~ August and was changed to 4L humidification with facemask due to epistaxis/nasal irritation.   Pulmonary exam normal  + decreased breath sounds      Cardiovascular Normal cardiovascular exam+ dysrhythmias (sinus node dysfunction s/p ppm 2013) + pacemaker  Rhythm:Regular Rate:Normal  RHC 06/02/20 [facility]) Conclusions:   Pulmonary arterial hypertension (mPAP 34, PVR 2.9 WU). Hemodynamics at  goal for treated [facility] patient.   Normal cardiac output/index.   Normal RA and PCW pressures.   In comparison to prior [place] from 08/2017 (report only), findings are  similar.    Echo 05/02/20 [facility]): Summary  1. The left ventricle is normal in size with normal wall thickness.  2. The left ventricular systolic function is normal, LVEF is visually  estimated at > 55%.  3. There is mild mitral valve regurgitation.  4. The left atrium is mildly dilated in size.  5. The right ventricle is mildly dilated in size, with normal systolic  function.  6. There is no pulmonary  hypertension, estimated pulmonary artery systolic  pressure is 32 mmHg.  7. There is mild to moderate pulmonic regurgitation.  8. The right atrium is mildly dilated in size.  9. Dilated pulmonary artery - mild.  10. Dilated pulmonary artery - mild.    Last cardiology visit with Victorino Sparrow, Rexford Maus on 05/02/20. Per 05/02/20 PPM interrogation [facility]): "Normal pacemaker function A pace - 29.6% V pace - <0.1% Events - none Battery - 2.9V (ERI 2.81V) Underlying Rhythm - sb Continue remotes".   R heart cath:  Pulmonary arterial hypertension (mPAP 34, PVR 2.9 WU). Hemodynamics at  goal for treated [facility] patient.   Normal cardiac output/index.   Normal RA and PCW pressures.   In comparison to prior [place] from 08/2017 (report only), findings are  similar.    Neuro/Psych  Headaches, PSYCHIATRIC DISORDERS Anxiety Depression    GI/Hepatic Neg liver ROS, GERD  ,Polyp- underwent colonoscopy on 08/11/20 with several polyp resections; however, treatment for a 30 mm polyp in the proximal transverse colon was not successful. Patient was referred to Dr. Rush Landmark for resection of large polyp.    Endo/Other  Obesity BMI 38  Renal/GU negative Renal ROS  negative genitourinary   Musculoskeletal  (+) Arthritis , Osteoarthritis,    Abdominal (+) + obese,   Peds  Hematology negative hematology ROS (+)   Anesthesia Other Findings   Reproductive/Obstetrics negative OB ROS                           Anesthesia Physical Anesthesia Plan  ASA: III  Anesthesia Plan: MAC   Post-op Pain Management:  Induction:   PONV Risk Score and Plan: 2 and Propofol infusion and TIVA  Airway Management Planned: Natural Airway and Simple Face Mask  Additional Equipment: None  Intra-op Plan:   Post-operative Plan:   Informed Consent: I have reviewed the patients History and Physical, chart, labs and discussed the procedure including the risks,  benefits and alternatives for the proposed anesthesia with the patient or authorized representative who has indicated his/her understanding and acceptance.       Plan Discussed with: CRNA  Anesthesia Plan Comments:        Anesthesia Quick Evaluation

## 2020-10-20 ENCOUNTER — Encounter (HOSPITAL_COMMUNITY): Payer: Self-pay | Admitting: Gastroenterology

## 2020-10-20 ENCOUNTER — Other Ambulatory Visit: Payer: Self-pay | Admitting: Physician Assistant

## 2020-10-20 ENCOUNTER — Other Ambulatory Visit: Payer: Self-pay

## 2020-10-20 ENCOUNTER — Ambulatory Visit (HOSPITAL_COMMUNITY): Payer: Medicare Other | Admitting: Vascular Surgery

## 2020-10-20 ENCOUNTER — Encounter (HOSPITAL_COMMUNITY)
Admission: RE | Disposition: A | Discharge status: Home / Self Care | Payer: Self-pay | Source: Home / Self Care | Attending: Gastroenterology

## 2020-10-20 ENCOUNTER — Ambulatory Visit (HOSPITAL_COMMUNITY)
Admission: RE | Admit: 2020-10-20 | Discharge: 2020-10-20 | Disposition: A | Discharge status: Home / Self Care | Payer: Medicare Other | Attending: Gastroenterology | Admitting: Gastroenterology

## 2020-10-20 DIAGNOSIS — K635 Polyp of colon: Secondary | ICD-10-CM | POA: Diagnosis not present

## 2020-10-20 DIAGNOSIS — I2721 Secondary pulmonary arterial hypertension: Secondary | ICD-10-CM | POA: Diagnosis not present

## 2020-10-20 DIAGNOSIS — D127 Benign neoplasm of rectosigmoid junction: Secondary | ICD-10-CM

## 2020-10-20 DIAGNOSIS — K621 Rectal polyp: Secondary | ICD-10-CM | POA: Insufficient documentation

## 2020-10-20 DIAGNOSIS — Z7951 Long term (current) use of inhaled steroids: Secondary | ICD-10-CM | POA: Insufficient documentation

## 2020-10-20 DIAGNOSIS — Z91048 Other nonmedicinal substance allergy status: Secondary | ICD-10-CM | POA: Diagnosis not present

## 2020-10-20 DIAGNOSIS — K3189 Other diseases of stomach and duodenum: Secondary | ICD-10-CM | POA: Diagnosis not present

## 2020-10-20 DIAGNOSIS — Z803 Family history of malignant neoplasm of breast: Secondary | ICD-10-CM | POA: Diagnosis not present

## 2020-10-20 DIAGNOSIS — K2100 Gastro-esophageal reflux disease with esophagitis, without bleeding: Secondary | ICD-10-CM | POA: Insufficient documentation

## 2020-10-20 DIAGNOSIS — K2980 Duodenitis without bleeding: Secondary | ICD-10-CM | POA: Insufficient documentation

## 2020-10-20 DIAGNOSIS — Z833 Family history of diabetes mellitus: Secondary | ICD-10-CM | POA: Insufficient documentation

## 2020-10-20 DIAGNOSIS — D123 Benign neoplasm of transverse colon: Secondary | ICD-10-CM | POA: Diagnosis not present

## 2020-10-20 DIAGNOSIS — K449 Diaphragmatic hernia without obstruction or gangrene: Secondary | ICD-10-CM | POA: Diagnosis not present

## 2020-10-20 DIAGNOSIS — Z8379 Family history of other diseases of the digestive system: Secondary | ICD-10-CM | POA: Insufficient documentation

## 2020-10-20 DIAGNOSIS — Z888 Allergy status to other drugs, medicaments and biological substances status: Secondary | ICD-10-CM | POA: Insufficient documentation

## 2020-10-20 DIAGNOSIS — Z800 Family history of malignant neoplasm of digestive organs: Secondary | ICD-10-CM

## 2020-10-20 DIAGNOSIS — K644 Residual hemorrhoidal skin tags: Secondary | ICD-10-CM | POA: Insufficient documentation

## 2020-10-20 DIAGNOSIS — R001 Bradycardia, unspecified: Secondary | ICD-10-CM | POA: Insufficient documentation

## 2020-10-20 DIAGNOSIS — Z950 Presence of cardiac pacemaker: Secondary | ICD-10-CM | POA: Insufficient documentation

## 2020-10-20 DIAGNOSIS — Z79899 Other long term (current) drug therapy: Secondary | ICD-10-CM | POA: Insufficient documentation

## 2020-10-20 DIAGNOSIS — Z8249 Family history of ischemic heart disease and other diseases of the circulatory system: Secondary | ICD-10-CM | POA: Insufficient documentation

## 2020-10-20 DIAGNOSIS — Z8041 Family history of malignant neoplasm of ovary: Secondary | ICD-10-CM | POA: Insufficient documentation

## 2020-10-20 DIAGNOSIS — Z86010 Personal history of colonic polyps: Secondary | ICD-10-CM | POA: Diagnosis not present

## 2020-10-20 DIAGNOSIS — K641 Second degree hemorrhoids: Secondary | ICD-10-CM | POA: Insufficient documentation

## 2020-10-20 DIAGNOSIS — I272 Pulmonary hypertension, unspecified: Secondary | ICD-10-CM | POA: Insufficient documentation

## 2020-10-20 DIAGNOSIS — Z8051 Family history of malignant neoplasm of kidney: Secondary | ICD-10-CM | POA: Insufficient documentation

## 2020-10-20 DIAGNOSIS — Z836 Family history of other diseases of the respiratory system: Secondary | ICD-10-CM | POA: Insufficient documentation

## 2020-10-20 DIAGNOSIS — Z87891 Personal history of nicotine dependence: Secondary | ICD-10-CM | POA: Insufficient documentation

## 2020-10-20 HISTORY — PX: COLONOSCOPY WITH PROPOFOL: SHX5780

## 2020-10-20 HISTORY — PX: POLYPECTOMY: SHX5525

## 2020-10-20 HISTORY — DX: Cardiac arrhythmia, unspecified: I49.9

## 2020-10-20 HISTORY — DX: Dependence on supplemental oxygen: Z99.81

## 2020-10-20 HISTORY — PX: ENDOSCOPIC MUCOSAL RESECTION: SHX6839

## 2020-10-20 HISTORY — PX: ESOPHAGOGASTRODUODENOSCOPY (EGD) WITH PROPOFOL: SHX5813

## 2020-10-20 HISTORY — PX: BIOPSY: SHX5522

## 2020-10-20 HISTORY — DX: Headache, unspecified: R51.9

## 2020-10-20 HISTORY — PX: HEMOSTASIS CLIP PLACEMENT: SHX6857

## 2020-10-20 HISTORY — PX: HOT HEMOSTASIS: SHX5433

## 2020-10-20 HISTORY — DX: Unspecified osteoarthritis, unspecified site: M19.90

## 2020-10-20 HISTORY — DX: Dyspnea, unspecified: R06.00

## 2020-10-20 SURGERY — COLONOSCOPY WITH PROPOFOL
Anesthesia: Monitor Anesthesia Care

## 2020-10-20 MED ORDER — LIDOCAINE 2% (20 MG/ML) 5 ML SYRINGE
INTRAMUSCULAR | Status: DC | PRN
  Administered 2020-10-20: 60 mg via INTRAVENOUS

## 2020-10-20 MED ORDER — PROPOFOL 500 MG/50ML IV EMUL
INTRAVENOUS | Status: DC | PRN
  Administered 2020-10-20: 100 ug/kg/min via INTRAVENOUS

## 2020-10-20 MED ORDER — LACTATED RINGERS IV SOLN: Freq: Once | INTRAVENOUS | Status: AC

## 2020-10-20 MED ORDER — PROPOFOL 10 MG/ML IV BOLUS
INTRAVENOUS | Status: DC | PRN
  Administered 2020-10-20: 30 mg via INTRAVENOUS
  Administered 2020-10-20: 25 mg via INTRAVENOUS
  Administered 2020-10-20: 30 mg via INTRAVENOUS

## 2020-10-20 MED ORDER — LACTATED RINGERS IV SOLN: INTRAVENOUS | Status: DC | PRN

## 2020-10-20 MED ORDER — PANTOPRAZOLE SODIUM 40 MG PO TBEC: 40.0000 mg | DELAYED_RELEASE_TABLET | Freq: Two times a day (BID) | ORAL | 6 refills | Status: DC

## 2020-10-20 MED ORDER — SODIUM CHLORIDE 0.9 % IV SOLN: INTRAVENOUS | Status: DC

## 2020-10-20 MED ORDER — ESOMEPRAZOLE MAGNESIUM 40 MG PO CPDR: 40.0000 mg | DELAYED_RELEASE_CAPSULE | Freq: Two times a day (BID) | ORAL | 6 refills | Status: DC

## 2020-10-20 SURGICAL SUPPLY — 24 items

## 2020-10-20 NOTE — Transfer of Care (Signed)
Immediate Anesthesia Transfer of Care Note  Patient: Ernie Hew  Procedure(s) Performed: COLONOSCOPY WITH PROPOFOL (N/A ) ENDOSCOPIC MUCOSAL RESECTION (N/A ) ESOPHAGOGASTRODUODENOSCOPY (EGD) WITH PROPOFOL (N/A ) BIOPSY POLYPECTOMY HOT HEMOSTASIS (ARGON PLASMA COAGULATION/BICAP) (N/A ) HEMOSTASIS CLIP PLACEMENT  Patient Location: PACU  Anesthesia Type:MAC  Level of Consciousness: drowsy and patient cooperative  Airway & Oxygen Therapy: Patient spontaneously breathing and connected to nasal cannula  Post-op Assessment: Report given to RN and Post -op Vital signs reviewed and stable  Post vital signs: Reviewed and stable  Last Vitals:  Vitals Value Taken Time  BP 121/76 10/20/20 0856  Temp    Pulse 84 10/20/20 0858  Resp 19 10/20/20 0858  SpO2 97 % 10/20/20 0858  Vitals shown include unvalidated device data.  Last Pain:  Vitals:   10/20/20 0712  TempSrc: Temporal         Complications: No complications documented.

## 2020-10-20 NOTE — Op Note (Signed)
[facility] Patient Name: Rachel Wall Procedure Date : 10/20/2020 MRN: [id number] Attending MD: Justice Britain , MD Date of Birth: 06/28/1981 CSN: [id number] Age: 40 Admit Type: Outpatient Procedure:                Upper GI endoscopy Indications:              Heartburn, Esophageal reflux symptoms that recur                            despite appropriate therapy, Abdominal bloating,                            Diarrhea, Family history Celiac disease Providers:                Justice Britain, MD, Baird Cancer, RN, Elspeth Cho Tech., Technician Referring MD:             Jonathon Bellows MD, MD, [place]:                Monitored Anesthesia Care Complications:            No immediate complications. Estimated Blood Loss:     Estimated blood loss was minimal. Procedure:                Pre-Anesthesia Assessment:                           - Prior to the procedure, a History and Physical                            was performed, and patient medications and                            allergies were reviewed. The patient's tolerance of                            previous anesthesia was also reviewed. The risks                            and benefits of the procedure and the sedation                            options and risks were discussed with the patient.                            All questions were answered, and informed consent                            was obtained. Prior Anticoagulants: The patient has                            taken no previous anticoagulant or antiplatelet  agents. ASA Grade Assessment: II - A patient with                            mild systemic disease. After reviewing the risks                            and benefits, the patient was deemed in                            satisfactory condition to undergo the procedure.                           After obtaining informed consent,  the endoscope was                            passed under direct vision. Throughout the                            procedure, the patient's blood pressure, pulse, and                            oxygen saturations were monitored continuously. The                            [id number]-H190 ([id number]) Olympus gastroscope was                            introduced through the mouth, and advanced to the                            second part of duodenum. The upper GI endoscopy was                            accomplished without difficulty. The patient                            tolerated the procedure. Scope In: Scope Out: Findings:      No gross lesions were noted in the proximal esophagus and in the mid       esophagus.      LA Grade C (one or more mucosal breaks continuous between tops of 2 or       more mucosal folds, less than 75% circumference) esophagitis with no       bleeding was found in the distal esophagus (3 cm from above the GE Jxn).      The Z-line was regular and was found 41 cm from the incisors.      Biopsies were taken with a cold forceps in the entire esophagus for       histology to rule out EoE.      A 1 cm hiatal hernia was present.      Localized moderately erythematous mucosa without bleeding was found in       the gastric body and in the gastric antrum.      No other gross lesions were noted in the entire examined stomach.  Biopsies were taken with a cold forceps for histology and Helicobacter       pylori testing.      Two 7 mm nodules were found in the duodenal bulb. Biopsies were taken       with a cold forceps for histology - though suspicious for gastric       heterotopia vs peptic duodenitis more so than adenomatous change.      No other gross lesions were noted in the duodenal bulb, in the first       portion of the duodenum and in the second portion of the duodenum.       Biopsies for histology were taken with a cold forceps for evaluation of       celiac  disease. Impression:               - No gross lesions in esophagus proximally. LA                            Grade C reflux esophagitis with no bleeding. Z-line                            regular, 41 cm from the incisors. Esophageal                            biopsies obtained.                           - 1 cm hiatal hernia. Erythematous mucosa in the                            gastric body and antrum. No other gross lesions in                            the stomach. Biopsied.                           - Two nodules found in the duodenum bulb - query                            gastric heterotopia. Biopsied.                           - No other gross lesions in the duodenal bulb, in                            the first portion of the duodenum and in the second                            portion of the duodenum. Biopsied. Recommendation:           - Proceed to scheduled colonoscopy.                           - Observe patient's clinical course.                           - Await  pathology results.                           - Start Nexium 40 mg twice daily and maintain.                           - Recommend 3-4 month follow up EGD to ensure                            healing of esophagitis and to decrease PPI dosing                            thereafter - want to ensure on evidence of                            Barrett's esophagus is hidden under esophagitis as                            well.                           - The findings and recommendations were discussed                            with the patient.                           - The findings and recommendations were discussed                            with the patient's family. Procedure Code(s):        --- Professional ---                           [phone], Esophagogastroduodenoscopy, flexible,                            transoral; with biopsy, single or multiple Diagnosis Code(s):        --- Professional ---                            K21.00, Gastro-esophageal reflux disease with                            esophagitis, without bleeding                           K44.9, Diaphragmatic hernia without obstruction or                            gangrene                           K31.89, Other diseases of stomach and duodenum                           R12, Heartburn  R14.0, Abdominal distension (gaseous)                           R19.7, Diarrhea, unspecified CPT copyright 2019 American Medical Association. All rights reserved. The codes documented in this report are preliminary and upon coder review may  be revised to meet current compliance requirements. Justice Britain, MD 10/20/2020 9:09:21 AM Number of Addenda: 0

## 2020-10-20 NOTE — Anesthesia Postprocedure Evaluation (Signed)
Anesthesia Post Note  Patient: Rachel Wall  Procedure(s) Performed: COLONOSCOPY WITH PROPOFOL (N/A ) ENDOSCOPIC MUCOSAL RESECTION (N/A ) ESOPHAGOGASTRODUODENOSCOPY (EGD) WITH PROPOFOL (N/A ) BIOPSY POLYPECTOMY HOT HEMOSTASIS (ARGON PLASMA COAGULATION/BICAP) (N/A ) HEMOSTASIS CLIP PLACEMENT     Patient location during evaluation: PACU Anesthesia Type: MAC Level of consciousness: awake and alert Pain management: pain level controlled Vital Signs Assessment: post-procedure vital signs reviewed and stable Respiratory status: spontaneous breathing, nonlabored ventilation and respiratory function stable Cardiovascular status: blood pressure returned to baseline and stable Postop Assessment: no apparent nausea or vomiting Anesthetic complications: no   No complications documented.  Last Vitals:  Vitals:   10/20/20 0910 10/20/20 0925  BP: 122/73 113/77  Pulse: 75 78  Resp: 13 20  Temp:  36.8 C  SpO2: 95% 95%    Last Pain:  Vitals:   10/20/20 0925  TempSrc:   PainSc: 0-No pain                 Pervis Hocking

## 2020-10-20 NOTE — Interval H&P Note (Signed)
History and Physical Interval Note:  10/20/2020 7:30 AM  Rachel Wall  has presented today for surgery, with the diagnosis of large polyp.  The various methods of treatment have been discussed with the patient and family. After consideration of risks, benefits and other options for treatment, the patient has consented to  Procedure(s): COLONOSCOPY WITH PROPOFOL (N/A) ENDOSCOPIC MUCOSAL RESECTION (N/A) ESOPHAGOGASTRODUODENOSCOPY (EGD) WITH PROPOFOL (N/A) as a surgical intervention.  The patient's history has been reviewed, patient examined, no change in status, stable for surgery.  I have reviewed the patient's chart and labs.  Questions were answered to the patient's satisfaction.     [employer]

## 2020-10-20 NOTE — Anesthesia Procedure Notes (Signed)
Procedure Name: MAC Date/Time: 10/20/2020 7:39 AM Performed by: Colin Benton, CRNA Pre-anesthesia Checklist: Patient identified, Emergency Drugs available, Suction available and Patient being monitored Patient Re-evaluated:Patient Re-evaluated prior to induction Oxygen Delivery Method: Nasal cannula Induction Type: IV induction Placement Confirmation: positive ETCO2 Dental Injury: Teeth and Oropharynx as per pre-operative assessment

## 2020-10-20 NOTE — Op Note (Signed)
[facility] Patient Name: Rachel Wall Procedure Date : 10/20/2020 MRN: [id number] Attending MD: Justice Britain , MD Date of Birth: 11-10-1980 CSN: [id number] Age: 40 Admit Type: Outpatient Procedure:                Colonoscopy Indications:              Excision of colonic polyp (TC polyp that was noted                            and did not lift appropriately), Family history of                            colon cancer in a first-degree relative before age                            26 years (sister); History of multiple polyps                            (Query possibility of Serrated Polyposis) Providers:                Justice Britain, MD, Baird Cancer, RN, Elspeth Cho Tech., Technician Referring MD:             Jonathon Bellows MD, MD, [place]:                Monitored Anesthesia Care Complications:            No immediate complications. Estimated Blood Loss:     Estimated blood loss was minimal. Procedure:                Pre-Anesthesia Assessment:                           - Prior to the procedure, a History and Physical                            was performed, and patient medications and                            allergies were reviewed. The patient's tolerance of                            previous anesthesia was also reviewed. The risks                            and benefits of the procedure and the sedation                            options and risks were discussed with the patient.                            All questions were answered, and informed consent  was obtained. Prior Anticoagulants: The patient has                            taken no previous anticoagulant or antiplatelet                            agents. ASA Grade Assessment: II - A patient with                            mild systemic disease. After reviewing the risks                            and benefits, the patient  was deemed in                            satisfactory condition to undergo the procedure.                           After obtaining informed consent, the colonoscope                            was passed under direct vision. Throughout the                            procedure, the patient's blood pressure, pulse, and                            oxygen saturations were monitored continuously. The                            CF-HQ190L ([id number]) Olympus colonoscope was                            introduced through the anus and advanced to the 5                            cm into the ileum. The colonoscopy was performed                            without difficulty. The patient tolerated the                            procedure. The quality of the bowel preparation was                            good. The terminal ileum, ileocecal valve,                            appendiceal orifice, and rectum were photographed. Scope In: 8:06:51 AM Scope Out: 8:47:29 AM Scope Withdrawal Time: 0 hours 37 minutes 13 seconds  Total Procedure Duration: 0 hours 40 minutes 38 seconds  Findings:      The digital rectal exam findings include hemorrhoids. Pertinent       negatives include no palpable  rectal lesions.      The terminal ileum and ileocecal valve appeared normal.      A tattoo was seen in the ascending colon. The tattoo site appeared       normal.      A 30 mm polyp was found in the transverse colon. The polyp was granular       lateral spreading. Preparations were made for mucosal resection. NBI       imaging and White-light endoscopy was done to demarcate the borders of       the lesion. Orise gel was injected to raise the lesion. Piecemeal       mucosal resection using a snare was performed. Resection and retrieval       were complete. Coagulation for tissue destruction to the margins of the       resection using snare tip soft coagulation (STSC) was successful. To       prevent bleeding  post-intervention, five hemostatic clips were       successfully placed (MR conditional). There was no bleeding during, or       at the end, of the procedure.      Two tattoos were seen in the transverse colon (they were proximal and       distal to the aforementioned polyp.      Three sessile polyps were found in the recto-sigmoid colon (1) and       transverse colon (2). The polyps were 3 to 5 mm in size. These polyps       were removed with a cold snare. Resection and retrieval were complete.      A patchy area of granular mucosa was found in the rectum. Biopsies were       taken with a cold forceps for histology to rule out proctitis.      Normal mucosa was found in the entire colon otherwise.      Non-bleeding non-thrombosed external and internal hemorrhoids were found       during retroflexion, during perianal exam and during digital exam. The       hemorrhoids were Grade II (internal hemorrhoids that prolapse but reduce       spontaneously). Impression:               - Hemorrhoids found on digital rectal exam.                           - The examined portion of the ileum was normal.                           - A tattoo was seen in the ascending colon. The                            tattoo site appeared normal.                           - One 30 mm polyp in the transverse colon, removed                            with piecemeal mucosal resection. Resected and  retrieved. STSC performed. Clips (MR conditional)                            were placed.                           - Two tattoos were seen in the transverse colon                            (proximal/distal to the polyp noted).                           - Three 3 to 5 mm polyps at the recto-sigmoid colon                            and in the transverse colon, removed with a cold                            snare. Resected and retrieved.                           - Granularity in the rectum.  Biopsied.                           - Normal mucosa in the entire examined colon                            otherwise.                           - Non-bleeding non-thrombosed external and internal                            hemorrhoids. Recommendation:           - The patient will be observed post-procedure,                            until all discharge criteria are met.                           - Discharge patient to home.                           - Patient has a contact number available for                            emergencies. The signs and symptoms of potential                            delayed complications were discussed with the                            patient. Return to normal activities tomorrow.  Written discharge instructions were provided to the                            patient.                           - High fiber diet.                           - Use FiberCon 1-2 tablets PO daily.                           - No ibuprofen, naproxen, or other non-steroidal                            anti-inflammatory drugs for 2 weeks after polyp                            removal.                           - Await pathology results.                           - Repeat colonoscopy in 6-9 months for surveillance                            after piecemeal polypectomy.                           - The findings and recommendations were discussed                            with the patient.                           - The findings and recommendations were discussed                            with the patient's family. Procedure Code(s):        --- Professional ---                           [phone], Colonoscopy, flexible; with endoscopic                            mucosal resection                           45385, 13, Colonoscopy, flexible; with removal of                            tumor(s), polyp(s), or other lesion(s) by snare                             technique Diagnosis Code(s):        --- Professional ---  K64.1, Second degree hemorrhoids                           K63.5, Polyp of colon                           Z80.0, Family history of malignant neoplasm of                            digestive organs CPT copyright 2019 American Medical Association. All rights reserved. The codes documented in this report are preliminary and upon coder review may  be revised to meet current compliance requirements. Justice Britain, MD 10/20/2020 9:18:43 AM Number of Addenda: 0

## 2020-10-20 NOTE — Discharge Instructions (Signed)
YOU HAD AN ENDOSCOPIC PROCEDURE TODAY: Refer to the procedure report and other information in the discharge instructions given to you for any specific questions about what was found during the examination. If this information does not answer your questions, please call [place] office at [phone] to clarify.  ° °YOU SHOULD EXPECT: Some feelings of bloating in the abdomen. Passage of more gas than usual. Walking can help get rid of the air that was put into your GI tract during the procedure and reduce the bloating. If you had a lower endoscopy (such as a colonoscopy or flexible sigmoidoscopy) you may notice spotting of blood in your stool or on the toilet paper. Some abdominal soreness may be present for a day or two, also. ° °DIET: Your first meal following the procedure should be a light meal and then it is ok to progress to your normal diet. A half-sandwich or bowl of soup is an example of a good first meal. Heavy or fried foods are harder to digest and may make you feel nauseous or bloated. Drink plenty of fluids but you should avoid alcoholic beverages for 24 hours. If you had a esophageal dilation, please see attached instructions for diet.   ° °ACTIVITY: Your care partner should take you home directly after the procedure. You should plan to take it easy, moving slowly for the rest of the day. You can resume normal activity the day after the procedure however YOU SHOULD NOT DRIVE, use power tools, machinery or perform tasks that involve climbing or major physical exertion for 24 hours (because of the sedation medicines used during the test).  ° °SYMPTOMS TO REPORT IMMEDIATELY: °A gastroenterologist can be reached at any hour. Please call [phone]  for any of the following symptoms:  °Following lower endoscopy (colonoscopy, flexible sigmoidoscopy) °Excessive amounts of blood in the stool  °Significant tenderness, worsening of abdominal pains  °Swelling of the abdomen that is new, acute  °Fever of 100° or  higher  °Following upper endoscopy (EGD, EUS, ERCP, esophageal dilation) °Vomiting of blood or coffee ground material  °New, significant abdominal pain  °New, significant chest pain or pain under the shoulder blades  °Painful or persistently difficult swallowing  °New shortness of breath  °Black, tarry-looking or red, bloody stools ° °FOLLOW UP:  °If any biopsies were taken you will be contacted by phone or by letter within the next 1-3 weeks. Call [phone]  if you have not heard about the biopsies in 3 weeks.  °Please also call with any specific questions about appointments or follow up tests. ° °

## 2020-10-21 ENCOUNTER — Telehealth: Payer: Self-pay | Admitting: Gastroenterology

## 2020-10-21 LAB — SURGICAL PATHOLOGY

## 2020-10-21 NOTE — Telephone Encounter (Signed)
Rachel Wall, thank you for sending this message to me.  I had an opportunity to call and speak with the patient. She describes that approximately two hours after heading home from her procedure she began to experience pain in the mid portion of her back between both of her scapula. Pain is sharp stabbing. And it causes her to lose her breath at times. She denies any nausea or vomiting or abdominal pain. There are no fevers or chills. Sh e has never experienced this type of pain previously.  Pathology was also reviewed with her today since it had returned and mostly everything returned benign other than the large polyp which returned as a serrated polyp as well as some evidence of gastritis.  At this point in time even though this would not be an expected complication from yesterday's procedures, we must treat it as such.  At this point in the afternoo n on Friday, I'm not sure if we're going to be able to get any sort of imaging studies, at least a KUB and chest x-ray versus cross-sectional imaging based on how significant pain is in parentheses.  I will reach out to my nurse to see if any imaging can be done on a stat basis as an outpatient in the next few hours although I suspect this will not be possible and with the significance of her pain and discomfort I think she needs  further evaluation likely in the emergency department.  After I speak with my nurse she will update the patient with next steps.  We will follow up with the patient on Monday but again, if her pain severe she will need to be evaluated in the emergency department at [facility] regional or at [facility] or [place] part of [facility] system.  She is appreciative for her care but obviously is concerned about this symptoms and  hopeful that this is not a complication from the procedure.  GM

## 2020-10-21 NOTE — Telephone Encounter (Signed)
Per V.O from Dr Rush Landmark I called the pt and made her aware that we do not have the ability to get STAT imaging over the weekend. She she has been advised to go to the ED for evaluation of this significant back pain.  The pt agrees and states she will proceed to the ED at this time.

## 2020-10-21 NOTE — Telephone Encounter (Signed)
Inbound call from patient requesting a call back please.  States she has been having back pain since her procedure yesterday and wants to know if this is normal.  Please advise.

## 2020-10-21 NOTE — Telephone Encounter (Signed)
The pt had colon endo yesterday and began to have a sharp pain in the upper middle back between the shoulder blades about an hour after she returned home.  She says the pain is constant and is sharp, and burning. The pain does not radiate.  She slept with a heating pad last night with no relief.  The pain is constant.  Nothing makes it worse or better.  She took tylenol without relief. She states the pain is significant.  Please advise

## 2020-10-22 NOTE — Telephone Encounter (Signed)
Thank you [name] for updating her.  We will see what things look like over the weekend and please call her on Monday to check in and see how she is doing. FYI Dr. Vicente Males. GM

## 2020-10-24 ENCOUNTER — Encounter (HOSPITAL_COMMUNITY): Payer: Self-pay | Admitting: Gastroenterology

## 2020-10-24 NOTE — Telephone Encounter (Signed)
I spoke with the pt and she tells me that she did not go to the ED for evaluation.  She says that her back pain has resolved at this point and will call if any symptoms return.

## 2020-10-25 NOTE — Telephone Encounter (Signed)
Thanks for update. GM 

## 2020-10-31 ENCOUNTER — Encounter: Payer: Self-pay | Admitting: Gastroenterology

## 2020-10-31 ENCOUNTER — Inpatient Hospital Stay: Payer: Medicare Other | Attending: Genetic Counselor | Admitting: Genetic Counselor

## 2020-10-31 ENCOUNTER — Other Ambulatory Visit: Payer: Self-pay | Admitting: Genetic Counselor

## 2020-10-31 ENCOUNTER — Other Ambulatory Visit: Payer: Self-pay

## 2020-10-31 DIAGNOSIS — Z8041 Family history of malignant neoplasm of ovary: Secondary | ICD-10-CM | POA: Diagnosis not present

## 2020-10-31 DIAGNOSIS — Z8049 Family history of malignant neoplasm of other genital organs: Secondary | ICD-10-CM | POA: Insufficient documentation

## 2020-10-31 DIAGNOSIS — D123 Benign neoplasm of transverse colon: Secondary | ICD-10-CM

## 2020-10-31 DIAGNOSIS — Z803 Family history of malignant neoplasm of breast: Secondary | ICD-10-CM | POA: Diagnosis not present

## 2020-10-31 DIAGNOSIS — Z86010 Personal history of colonic polyps: Secondary | ICD-10-CM | POA: Diagnosis not present

## 2020-10-31 DIAGNOSIS — D839 Common variable immunodeficiency, unspecified: Secondary | ICD-10-CM

## 2020-11-01 ENCOUNTER — Encounter: Payer: Self-pay | Admitting: Genetic Counselor

## 2020-11-01 NOTE — Progress Notes (Addendum)
REFERRING PROVIDER: Agency, [facility] [place],  [place] [address]  PRIMARY PROVIDER:  Patient, No Pcp Per  PRIMARY REASON FOR VISIT:  1. History of colonic polyps   2. Family history of breast cancer   3. Family history of ovarian cancer   4. Family history of uterine cancer   5. Adenomatous polyp of transverse colon      HISTORY OF PRESENT ILLNESS:  I connected with  Rachel Wall on 11/02/2020 at 1:00 PM EDT by MyChart video conference and verified that I am speaking with the correct person using two identifiers.   Patient location: Home Provider location: [facility]  Rachel Wall, a 40 y.o. female, was seen for a [place] cancer genetics consultation at the request of Dr. Agency due to a family history of cancer.  Rachel Wall presents to clinic today to discuss the possibility of a hereditary predisposition to cancer, genetic testing, and to further clarify her future cancer risks, as well as potential cancer risks for family members.   Rachel Wall is a 40 y.o. female with no personal history of cancer.    CANCER HISTORY:  Oncology History   No history exists.     RISK FACTORS:  Menarche was at age 40.  First live birth at age 72.   Ovaries intact: yes.  Hysterectomy: yes.  Menopausal status: premenopausal.  HRT use: 0 years. Colonoscopy: yes; multiple polyps. Mammogram within the last year: yes. Number of breast biopsies: 0. Up to date with pelvic exams: yes. Any excessive radiation exposure in the past: no  Past Medical History:  Diagnosis Date  . Anxiety   . Arthritis   . 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 Depression   . Dyspnea   . Dysrhythmia   . Family history of breast cancer   . Family history of ovarian cancer   . GERD (gastroesophageal reflux disease)   . Headache    Migraines  . History of kidney stones   . On home oxygen therapy    4 l at night- Oxygen sat - 80's  . Presence of permanent cardiac pacemaker    . Pulmonary hypertension ([place])   . Tachycardia     Past Surgical History:  Procedure Laterality Date  . ABDOMINAL HYSTERECTOMY    . BIOPSY  10/20/2020   Procedure: BIOPSY;  Surgeon: Irving Copas., MD;  Location: [place];  Service: Gastroenterology;;  . COLONOSCOPY WITH PROPOFOL N/A 08/04/2020   Procedure: COLONOSCOPY WITH PROPOFOL;  Surgeon: Jonathon Bellows, MD;  Location[facility] ENDOSCOPY;  Service: Gastroenterology;  Laterality: N/A;  . COLONOSCOPY WITH PROPOFOL N/A 08/11/2020   Procedure: COLONOSCOPY WITH PROPOFOL;  Surgeon: Jonathon Bellows, MD;  Location: [facility] ENDOSCOPY;  Service: Gastroenterology;  Laterality: N/A;  . COLONOSCOPY WITH PROPOFOL N/A 10/20/2020   Procedure: COLONOSCOPY WITH PROPOFOL;  Surgeon: Rush Landmark Telford Nab., MD;  Location: [place];  Service: Gastroenterology;  Laterality: N/A;  . CYSTOSCOPY  05/12/2019   Procedure: CYSTOSCOPY;  Surgeon: Will Bonnet, MD;  Location: [facility] ORS;  Service: Gynecology;;  . Consuela Mimes    . ENDOSCOPIC MUCOSAL RESECTION N/A 10/20/2020   Procedure: ENDOSCOPIC MUCOSAL RESECTION;  Surgeon: Rush Landmark Telford Nab., MD;  Location: [place];  Service: Gastroenterology;  Laterality: N/A;  . ESOPHAGOGASTRODUODENOSCOPY (EGD) WITH PROPOFOL N/A 10/20/2020   Procedure: ESOPHAGOGASTRODUODENOSCOPY (EGD) WITH PROPOFOL;  Surgeon: Rush Landmark Telford Nab., MD;  Location: [place];  Service: Gastroenterology;  Laterality: N/A;  . heart monitor implant and  removal    . HEMOSTASIS CLIP PLACEMENT  10/20/2020   Procedure: HEMOSTASIS CLIP PLACEMENT;  Surgeon: Irving Copas., MD;  Location: [place];  Service: Gastroenterology;;  . HOT HEMOSTASIS N/A 10/20/2020   Procedure: HOT HEMOSTASIS (ARGON PLASMA COAGULATION/BICAP);  Surgeon: Irving Copas., MD;  Location: [place];  Service: Gastroenterology;  Laterality: N/A;  . INSERT / REPLACE / REMOVE PACEMAKER    . LITHOTRIPSY  2012  . PACEMAKER INSERTION  2013  .  POLYPECTOMY  10/20/2020   Procedure: POLYPECTOMY;  Surgeon: Mansouraty, Telford Nab., MD;  Location: [place];  Service: Gastroenterology;;  . STENT REMOVAL    . TOTAL LAPAROSCOPIC HYSTERECTOMY WITH SALPINGECTOMY Bilateral 05/12/2019   Procedure: TOTAL LAPAROSCOPIC HYSTERECTOMY;  Surgeon: Will Bonnet, MD;  Location: [facility] ORS;  Service: Gynecology;  Laterality: Bilateral;  . TUBAL LIGATION  10/10/2011    Social History   Socioeconomic History  . Marital status: Married    Spouse name: Not on file  . Number of children: Not on file  . Years of education: Not on file  . Highest education level: Not on file  Occupational History  . Not on file  Tobacco Use  . Smoking status: Former Smoker    Packs/day: 0.50    Types: Cigarettes    Quit date: 07/2020    Years since quitting: 0.3  . Smokeless tobacco: Never Used  Vaping Use  . Vaping Use: Every day  . Substances: Nicotine  Substance and Sexual Activity  . Alcohol use: Not Currently  . Drug use: Not Currently  . Sexual activity: Yes    Birth control/protection: Surgical    Comment: Tubal ligation/Hysterectomy  Other Topics Concern  . Not on file  Social History Narrative  . Not on file   Social Determinants of Health   Financial Resource Strain: Not on file  Food Insecurity: Not on file  Transportation Needs: Not on file  Physical Activity: Not on file  Stress: Not on file  Social Connections: Not on file     FAMILY HISTORY:  We obtained a detailed, 4-generation family history.  Significant diagnoses are listed below: Family History  Problem Relation Age of Onset  . Healthy Mother   . Lung disease Father   . Heart disease Father   . Diabetes Father   . Hypertension Father   . Kidney cancer Father 56  . Colon cancer Paternal Grandfather 81  . Colon cancer Sister 43  . Breast cancer Paternal Aunt   . Ovarian cancer Paternal Aunt   . Stomach cancer Paternal Aunt   . Brain cancer Paternal Uncle   .  Inflammatory bowel disease Neg Hx   . Liver disease Neg Hx   . Pancreatic cancer Neg Hx     The patient has three children who are cancer free.  She has two sisters and a brother.  One sister was diagnosed with colon cancer at 46, vulvar cancer at 25 and uterine cancer at 71.  The patient's father is deceased and her mother is living.  The patient's father died of kidney cancer at 67.  He had many siblings, but one sister had breast, ovarian and stomach cancer and one brother had brain cancer.  His father died of colon cancer.  The patient's mother is living. She has two brothers who are cancer free.  Her parents are deceased.  Rachel Wall is aware of previous family history of genetic testing for hereditary cancer risks. Patient's maternal ancestors are of Caucasian descent,  and paternal ancestors are of [place] descent. There is no reported Ashkenazi Jewish ancestry. There is no known consanguinity.    GENETIC COUNSELING ASSESSMENT: Rachel Wall is a 40 y.o. female with a family history of cancer which is somewhat suggestive of a hereditary cancer syndrome such as Lynch syndrome and predisposition to cancer given the combination of cancer and young ages of onset. We, therefore, discussed and recommended the following at today's visit.   DISCUSSION: We discussed that 5 - 10% of cancer is hereditary, and each type of cancer has its own risk for being hereditary.  Reviewing the cancers in her family, ovarian cancer has the highest risk with up to 20% being hereditary most commonly due to BRCA mutations.  Other cancers, such as the young uterine and colon cancer in her sister are more commonly associated with Lynch syndrome.  There are other genes that can be associated with hereditary ovarian, colon and uterine cancer syndromes.  Performing a test with a panel of genes will be most beneficial to address these issues.  We discussed that testing is beneficial for several reasons including knowing how to  follow individuals after completing their treatment, identifying whether potential treatment options such as PARP inhibitors would be beneficial, and understand if other family members could be at risk for cancer and allow them to undergo genetic testing.   We reviewed the characteristics, features and inheritance patterns of hereditary cancer syndromes. We also discussed genetic testing, including the appropriate family members to test, the process of testing, insurance coverage and turn-around-time for results. We discussed the implications of a negative, positive, carrier and/or variant of uncertain significant result. We recommended Rachel Wall pursue genetic testing for the Multi-cancer gene panel+RNA. The Multi-Gene Panel offered by [facility] includes sequencing and/or deletion duplication testing of the following 85 genes: AIP, ALK, APC, ATM, AXIN2,BAP1,  BARD1, BLM, BMPR1A, BRCA1, BRCA2, BRIP1, CASR, CDC73, CDH1, CDK4, CDKN1B, CDKN1C, CDKN2A (p14ARF), CDKN2A (p16INK4a), CEBPA, CHEK2, CTNNA1, DICER1, DIS3L2, EGFR (c.2369C>T, p.Thr790Met variant only), EPCAM (Deletion/duplication testing only), FH, FLCN, GATA2, GPC3, GREM1 (Promoter region deletion/duplication testing only), HOXB13 (c.251G>A, p.Gly84Glu), HRAS, KIT, MAX, MEN1, MET, MITF (c.952G>A, p.Glu318Lys variant only), MLH1, MSH2, MSH3, MSH6, MUTYH, NBN, NF1, NF2, NTHL1, PALB2, PDGFRA, PHOX2B, PMS2, POLD1, POLE, POT1, PRKAR1A, PTCH1, PTEN, RAD50, RAD51C, RAD51D, RB1, RECQL4, RET, RNF43, RUNX1, SDHAF2, SDHA (sequence changes only), SDHB, SDHC, SDHD, SMAD4, SMARCA4, SMARCB1, SMARCE1, STK11, SUFU, TERC, TERT, TMEM127, TP53, TSC1, TSC2, VHL, WRN and WT1.    Based on Rachel Wall's family history of cancer, she meets medical criteria for genetic testing. Despite that she meets criteria, she may still have an out of pocket cost. We discussed that if her out of pocket cost for testing is over $100, the laboratory will call and confirm whether she wants to proceed  with testing.  If the out of pocket cost of testing is less than $100 she will be billed by the genetic testing laboratory.   PLAN: After considering the risks, benefits, and limitations, Rachel Wall provided informed consent to pursue genetic testing and the blood sample was sent to [facility] for analysis of the Multi cancer gene panel +RNA. Results should be available within approximately 2-3 weeks' time, at which point they will be disclosed by telephone to Rachel Wall, as will any additional recommendations warranted by these results. Rachel Wall will receive a summary of her genetic counseling visit and a copy of her results once available. This information will also be available in Epic.  Lastly, we encouraged Rachel Wall to remain in contact with cancer genetics annually so that we can continuously update the family history and inform her of any changes in cancer genetics and testing that may be of benefit for this family.   Rachel Wall questions were answered to her satisfaction today. Our contact information was provided should additional questions or concerns arise. Thank you for the referral and allowing [place] to share in the care of your patient.   Rachel Wall P. Florene Glen, [place], [facility] Licensed, Insurance risk surveyor [email or web address] phone: [phone]  The patient was seen for a total of 45 minutes in face-to-face genetic counseling.  This patient was discussed with Drs. Magrinat, Lindi Adie and/or Burr Medico who agrees with the above.    _______________________________________________________________________ For Office Staff:  Number of people involved in session: 1 Was an Intern/ student involved with case: yes Fredrik Rigger

## 2020-11-08 ENCOUNTER — Telehealth: Payer: Self-pay

## 2020-11-08 NOTE — Telephone Encounter (Signed)
-----   Message from Jonathon Bellows, MD sent at 11/06/2020 11:53 AM EST ----- Rachel Wall   1. EGD with me in 4 months please 2. Check celiac serology +immunoglobulins in blood  3. Colonoscopy Dr Jaci Lazier office will schedule in 6 months 4. Video visit in 4-6 weeks     ----- Message ----- From: Irving Copas., MD Sent: 10/31/2020   4:14 AM EST To: Timothy Lasso, RN, Jonathon Bellows, MD  [name], result letters were sent to you to be sent.  Patient had evidence of increased intraepithelial lymphocytosis and we had previously already checked her celiac serologies which were fine.  However I would like her to have immunoglobulins checked to ensure that she does not have CVID.  She can have these labs drawn in [place].  Follow-up EGD in 4 months with Dr. Vicente Males or myself and follow-up colonoscopy recall with EMR in 6 to 9 months.  Thanks. GM

## 2020-11-08 NOTE — Telephone Encounter (Signed)
Patient has been informed of Dr. Georgeann Oppenheim recommendations. Pt verbalized understanding.

## 2020-11-09 ENCOUNTER — Telehealth: Payer: Self-pay | Admitting: Genetic Counselor

## 2020-11-09 ENCOUNTER — Inpatient Hospital Stay: Payer: Medicare Other | Attending: Genetic Counselor

## 2020-11-09 NOTE — Telephone Encounter (Signed)
Called pt per 2/9 sch msg - no answer, left message for pateint to call back to reschedule appt.

## 2020-12-02 ENCOUNTER — Inpatient Hospital Stay: Payer: Medicare Other | Attending: Oncology

## 2020-12-19 ENCOUNTER — Telehealth: Payer: Self-pay | Admitting: Genetic Counselor

## 2020-12-19 ENCOUNTER — Encounter: Payer: Self-pay | Admitting: Genetic Counselor

## 2020-12-19 ENCOUNTER — Ambulatory Visit: Payer: Self-pay | Admitting: Genetic Counselor

## 2020-12-19 ENCOUNTER — Other Ambulatory Visit: Payer: Self-pay | Admitting: Obstetrics and Gynecology

## 2020-12-19 DIAGNOSIS — Z1379 Encounter for other screening for genetic and chromosomal anomalies: Secondary | ICD-10-CM | POA: Insufficient documentation

## 2020-12-19 DIAGNOSIS — G4709 Other insomnia: Secondary | ICD-10-CM

## 2020-12-19 MED ORDER — TRAZODONE HCL 50 MG PO TABS: 150.0000 mg | ORAL_TABLET | Freq: Every evening | ORAL | 0 refills | Status: DC | PRN

## 2020-12-19 NOTE — Progress Notes (Signed)
HPI:  Rachel Wall was previously seen in the [place] clinic due to a family history of cancer and concerns regarding a hereditary predisposition to cancer. Please refer to our prior cancer genetics clinic note for more information regarding our discussion, assessment and recommendations, at the time. Rachel Wall recent genetic test results were disclosed to her, as were recommendations warranted by these results. These results and recommendations are discussed in more detail below.  CANCER HISTORY:  Oncology History   No history exists.    FAMILY HISTORY:  We obtained a detailed, 4-generation family history.  Significant diagnoses are listed below: Family History  Problem Relation Age of Onset  . Healthy Mother   . Lung disease Father   . Heart disease Father   . Diabetes Father   . Hypertension Father   . Kidney cancer Father 25  . Colon cancer Paternal Grandfather 63  . Colon cancer Sister 63  . Breast cancer Paternal Aunt   . Ovarian cancer Paternal Aunt   . Stomach cancer Paternal Aunt   . Brain cancer Paternal Uncle   . Inflammatory bowel disease Neg Hx   . Liver disease Neg Hx   . Pancreatic cancer Neg Hx     The patient has three children who are cancer free.  She has two sisters and a brother.  One sister was diagnosed with colon cancer at 59, vulvar cancer at 43 and uterine cancer at 12.  The patient's father is deceased and her mother is living.  The patient's father died of kidney cancer at 56.  He had many siblings, but one sister had breast, ovarian and stomach cancer and one brother had brain cancer.  His father died of colon cancer.  The patient's mother is living. She has two brothers who are cancer free.  Her parents are deceased.  Rachel Wall is aware of previous family history of genetic testing for hereditary cancer risks. Patient's maternal ancestors are of Caucasian descent, and paternal ancestors are of [place] descent. There is no reported  Ashkenazi Jewish ancestry. There is no known consanguinity.     GENETIC TEST RESULTS: Genetic testing reported out on December 17, 2020 through the Multi-cancer+RNA panel found no pathogenic mutations. The Multi-Gene Panel offered by [facility] includes sequencing and/or deletion duplication testing of the following 84 genes: AIP, ALK, APC, ATM, AXIN2,BAP1,  BARD1, BLM, BMPR1A, BRCA1, BRCA2, BRIP1, CASR, CDC73, CDH1, CDK4, CDKN1B, CDKN1C, CDKN2A (p14ARF), CDKN2A (p16INK4a), CEBPA, CHEK2, CTNNA1, DICER1, DIS3L2, EGFR (c.2369C>T, p.Thr790Met variant only), EPCAM (Deletion/duplication testing only), FH, FLCN, GATA2, GPC3, GREM1 (Promoter region deletion/duplication testing only), HOXB13 (c.251G>A, p.Gly84Glu), HRAS, KIT, MAX, MEN1, MET, MITF (c.952G>A, p.Glu318Lys variant only), MLH1, MSH2, MSH3, MSH6, MUTYH, NBN, NF1, NF2, NTHL1, PALB2, PDGFRA, PHOX2B, PMS2, POLD1, POLE, POT1, PRKAR1A, PTCH1, PTEN, RAD50, RAD51C, RAD51D, RB1, RECQL4, RET, RUNX1, SDHAF2, SDHA (sequence changes only), SDHB, SDHC, SDHD, SMAD4, SMARCA4, SMARCB1, SMARCE1, STK11, SUFU, TERC, TERT, TMEM127, TP53, TSC1, TSC2, VHL, WRN and WT1. The test report has been scanned into EPIC and is located under the Molecular Pathology section of the Results Review tab.  A portion of the result report is included below for reference.     We discussed with Rachel Wall that because current genetic testing is not perfect, it is possible there may be a gene mutation in one of these genes that current testing cannot detect, but that chance is small.  We also discussed, that there could be another gene that has not yet been discovered, or  that we have not yet tested, that is responsible for the cancer diagnoses in the family. It is also possible there is a hereditary cause for the cancer in the family that Rachel Wall did not inherit and therefore was not identified in her testing.  Therefore, it is important to remain in touch with cancer genetics in the future so that  we can continue to offer Rachel Wall the most up to date genetic testing.   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  At this time, it is unknown if this variant is associated with increased cancer risk or if this is a normal finding, but most variants such as this get reclassified to being inconsequential. It should not be used to make medical management decisions. With time, we suspect the lab will determine the significance of this variant, if any. If we do learn more about it, we will try to contact Rachel Wall to discuss it further. However, it is important to stay in touch with [place] periodically and keep the address and phone number up to date.  ADDITIONAL GENETIC TESTING: We discussed with Rachel Wall that her genetic testing was fairly extensive.  If there are genes identified to increase cancer risk that can be analyzed in the future, we would be happy to discuss and coordinate this testing at that time.    CANCER SCREENING RECOMMENDATIONS: Rachel Wall test result is considered negative (normal).  This means that we have not identified a hereditary cause for her family history of cancer at this time. Most cancers happen by chance and this negative test suggests that her cancer may fall into this category.    While reassuring, this does not definitively rule out a hereditary predisposition to cancer. It is still possible that there could be genetic mutations that are undetectable by current technology. There could be genetic mutations in genes that have not been tested or identified to increase cancer risk.  Therefore, it is recommended she continue to follow the cancer management and screening guidelines provided by her primary healthcare provider.   An individual's cancer risk and medical management are not determined by genetic test results alone. Overall cancer risk assessment incorporates additional factors, including  personal medical history, family history, and any available genetic information that may result in a personalized plan for cancer prevention and surveillance  RECOMMENDATIONS FOR FAMILY MEMBERS:  Individuals in this family might be at some increased risk of developing cancer, over the general population risk, simply due to the family history of cancer.  We recommended women in this family have a yearly mammogram beginning at age 19, or 4 years younger than the earliest onset of cancer, an annual clinical breast exam, and perform monthly breast self-exams. Women in this family should also have a gynecological exam as recommended by their primary provider. All family members should be referred for colonoscopy starting at age 96.  FOLLOW-UP: Lastly, we discussed with Rachel Wall that cancer genetics is a rapidly advancing field and it is possible that new genetic tests will be appropriate for her and/or her family members in the future. We encouraged her to remain in contact with cancer genetics on an annual basis so we can update her personal and family histories and let her know of advances in cancer genetics that may benefit this family.   Our contact number was provided. Ms. Caccamo questions were answered to her satisfaction, and she knows she is  welcome to call us at anytime with additional questions or concerns.   Roma Kayser, [place], [facility] Licensed, Certified Genetic Counselor [email or web address]

## 2020-12-19 NOTE — Telephone Encounter (Signed)
Revealed negative genetic testing.  Discussed that we do not know why there is cancer in the family. It could be due to a different gene that we are not testing, or maybe our current technology may not be able to pick something up.  It will be important for her to keep in contact with genetics to keep up with whether additional testing may be needed.  BLM VUS identified.  This will not change medical management.

## 2020-12-27 ENCOUNTER — Telehealth: Payer: Self-pay

## 2020-12-27 ENCOUNTER — Telehealth (INDEPENDENT_AMBULATORY_CARE_PROVIDER_SITE_OTHER): Payer: Medicare Other | Admitting: Gastroenterology

## 2020-12-27 DIAGNOSIS — Z86010 Personal history of colonic polyps: Secondary | ICD-10-CM

## 2020-12-27 DIAGNOSIS — K209 Esophagitis, unspecified without bleeding: Secondary | ICD-10-CM | POA: Diagnosis not present

## 2020-12-27 NOTE — Progress Notes (Signed)
Rachel Wall , MD [address]  [place]  [place],  [address]  Main: [phone]  Fax: [phone]   Primary Care Physician: Patient, No Pcp Per (Inactive)  Virtual Visit via Video Note  I connected with patient on 12/27/20 at  3:00 PM EDT by video and verified that I am speaking with the correct person using two identifiers.   I discussed the limitations, risks, security and privacy concerns of performing an evaluation and management service by video  and the availability of in person appointments. I also discussed with the patient that there may be a patient responsible charge related to this service. The patient expressed understanding and agreed to proceed.  Location of Patient: Home Location of Provider: Home Persons involved: Patient and provider only   History of Present Illness:   Follow-up for esophagitis, colon polyp  HPI: Rachel Wall is a 40 y.o. female   Summary of history :  I initially performed a colonoscopy on 08/04/2020 due to personal history of colon polyps.  Prep was fair hence repeated the procedure a week later and noted that he had a 10 mm polyp in the distal transverse colon which was resected 15 mm polyp was seen in the transverse colon which I try to raise and was subsequently resected.  30 mm polyp of the transverse colon did not obtain adequate lift and stuck to the lesion and referred to Dr. Rush Landmark for resection. He performed a colonoscopy on 10/20/2020 and the large polyp in the transverse colon was resected.  An EGD was also performed at the same time grade C esophagitis was noted a 1 cm hiatal hernia was noted.  Duodenal nodules were noted which were biopsied.  No evidence of eosinophilic esophagitis noted reactive gastropathy noted peptic duodenitis noted the polyp of the transverse colon was sessile serrated.  No abdominal genetic disorders were noted when she underwent genetic testing.  Her IgA level was normal.  Celiac  serology was also negative.   Interval history     Started on Protonix for GERD and has been taking 40 mg a day , she takes it before food . Takes it twice a day but does not work as well , still has indigestion .   Her younger sister has been diagnosed with colon cancer. Current Outpatient Medications  Medication Sig Dispense Refill  . albuterol (VENTOLIN HFA) 108 (90 Base) MCG/ACT inhaler Inhale 2 puffs into the lungs every 6 (six) hours as needed for wheezing or shortness of breath.     Marland Kitchen ambrisentan (LETAIRIS) 10 MG tablet Take 10 mg by mouth daily.     Marland Kitchen amoxicillin-clavulanate (AUGMENTIN) 875-125 MG tablet Take 1 tablet by mouth 2 (two) times daily.    . APO-VARENICLINE 0.5 MG tablet Take 0.5 mg by mouth daily.    . benzonatate (TESSALON) 100 MG capsule Take 100 mg by mouth 3 (three) times daily as needed.    . citalopram (CELEXA) 40 MG tablet Take 40 mg by mouth daily.    Marland Kitchen diltiazem (CARDIZEM) 120 MG tablet Take 120-240 mg by mouth See admin instructions. Take 120 mg by mouth in the morning and take 240 mg by mouth at bedtime    . famotidine (PEPCID) 20 MG tablet Take 80 mg by mouth 2 (two) times daily.    . furosemide (LASIX) 20 MG tablet Take 20 mg by mouth daily.    . Na Sulfate-K Sulfate-Mg Sulf (SUPREP BOWEL PREP KIT) 17.5-3.13-1.6 GM/177ML SOLN Take  1 kit by mouth as directed. 324 mL 0  . pantoprazole (PROTONIX) 40 MG tablet Take 1 tablet (40 mg total) by mouth 2 (two) times daily before a meal. 60 tablet 6  . Selexipag 1200 MCG TABS Take 1,200 mcg by mouth 2 (two) times daily.     . tadalafil, PAH, (ADCIRCA) 20 MG tablet Take 40 mg by mouth daily.    . traZODone (DESYREL) 50 MG tablet Take 3 tablets (150 mg total) by mouth at bedtime as needed for sleep. 90 tablet 0   No current facility-administered medications for this visit.    Allergies as of 12/27/2020 - Review Complete 10/20/2020  Allergen Reaction Noted  . Cyclosporine Other (See Comments) 05/05/2019  .  Nitroglycerin Other (See Comments) 05/05/2019  . Other Other (See Comments) 10/06/2018  . Tizanidine  07/01/2020  . Adhesive [tape] Rash 05/12/2019  . Tapentadol Rash 05/12/2019    Review of Systems:    All systems reviewed and negative except where noted in HPI.  General Appearance:    Alert, cooperative, no distress, appears stated age  Head:    Normocephalic, without obvious abnormality, atraumatic  Eyes:    PERRL, conjunctiva/corneas clear,  Ears:    Grossly normal hearing    Neurologic:  Grossly normal    Observations/Objective:  Labs: CMP     Component Value Date/Time   NA 137 10/12/2020 1241   K 4.1 10/12/2020 1241   CL 108 10/12/2020 1241   CO2 23 10/12/2020 1241   GLUCOSE 100 (H) 10/12/2020 1241   BUN 10 10/12/2020 1241   CREATININE 0.67 10/12/2020 1241   CALCIUM 9.8 10/12/2020 1241   PROT 6.7 05/08/2019 1007   ALBUMIN 4.1 05/08/2019 1007   AST 12 (L) 05/08/2019 1007   ALT 13 05/08/2019 1007   ALKPHOS 44 05/08/2019 1007   BILITOT 0.7 05/08/2019 1007   GFRNONAA >60 05/13/2019 0558   GFRAA >60 05/13/2019 0558   Lab Results  Component Value Date   WBC 8.5 10/12/2020   HGB 13.9 10/12/2020   HCT 41.0 10/12/2020   MCV 93.4 10/12/2020   PLT 176.0 10/12/2020    Imaging Studies: No results found.  Assessment and Plan:   Rachel Wall is a 41 y.o. y/o female here to follow-up after her colonoscopy at [facility] where she had a large polyp resected in her transverse colon.  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   Plan 1.  Repeat EGD to check for healing of esophagitis and ulcer biopsy the duodenum to rule out persistence of intraepithelial lymphocytosis this can be done at [facility].. Continue Protonix 40 mg twice daily and add Pepcid at night for residual symptoms.  Discussed about lifestyle changes for acid reflux.  2.   Colonoscopy with Dr. Rush Landmark in 6 to 8 months which would be July or August 2022 for evaluation of the polypectomy site.  I have discussed alternative options, risks & benefits,  which include, but are not limited to, bleeding, infection, perforation,respiratory complication & drug reaction.  The patient agrees with this plan & written consent will be obtained.      I discussed the assessment and treatment plan with the patient. The patient was provided an opportunity to ask questions and all were answered. The patient agreed with the plan and demonstrated an understanding of the instructions.   The  patient was advised to call back or seek an in-person evaluation if the symptoms worsen or if the condition fails to improve as anticipated.  I provided 20 minutes of face-to-face time during this encounter.  Dr Rachel Bellows MD,MRCP [facility]) Gastroenterology/Hepatology Pager: [phone]   Speech recognition software was used to dictate this note.

## 2020-12-27 NOTE — Telephone Encounter (Signed)
Called patient to reminder her of the scheduled video visit for today. NO answer, LVM.

## 2020-12-30 ENCOUNTER — Other Ambulatory Visit: Payer: Self-pay

## 2020-12-30 DIAGNOSIS — K209 Esophagitis, unspecified without bleeding: Secondary | ICD-10-CM | POA: Insufficient documentation

## 2020-12-30 NOTE — Progress Notes (Signed)
Procedure scheduled and updated instructions have been sent via my chart and mailed out.

## 2020-12-31 ENCOUNTER — Other Ambulatory Visit: Payer: Self-pay | Admitting: Obstetrics and Gynecology

## 2020-12-31 DIAGNOSIS — G4709 Other insomnia: Secondary | ICD-10-CM

## 2021-01-17 ENCOUNTER — Encounter: Payer: Self-pay | Admitting: Obstetrics

## 2021-01-17 ENCOUNTER — Ambulatory Visit (INDEPENDENT_AMBULATORY_CARE_PROVIDER_SITE_OTHER): Payer: Medicare Other | Admitting: Obstetrics

## 2021-01-17 ENCOUNTER — Other Ambulatory Visit: Payer: Self-pay

## 2021-01-17 VITALS — BP 120/80 | Ht 69.0 in | Wt 254.0 lb

## 2021-01-17 DIAGNOSIS — N898 Other specified noninflammatory disorders of vagina: Secondary | ICD-10-CM

## 2021-01-17 LAB — POCT WET PREP (WET MOUNT)
Clue Cells Wet Prep Whiff POC: NEGATIVE
Trichomonas Wet Prep HPF POC: ABSENT

## 2021-01-17 MED ORDER — METRONIDAZOLE 500 MG PO TABS: 500.0000 mg | ORAL_TABLET | Freq: Two times a day (BID) | ORAL | 0 refills | Status: AC

## 2021-01-17 NOTE — Progress Notes (Signed)
Obstetrics & Gynecology Office Visit   Chief Complaint:  Chief Complaint  Patient presents with  . Vaginitis    History of Present Illness: Rachel Wall presents with a concern that she has BV She has noticed a malodor after IC for many months now. She is married ,and monogamous. Frequency of IC is , per her report, "infrequent " due to  low libido. She has not had an Annual Exam this year and is due. She denies any heavy vaginal discharge; what she does notice is a whitish dc, and the odor is her greater concern.   Review of Systems:  Review of Systems  Constitutional: Negative.   Eyes: Negative.   Respiratory: Negative.   Cardiovascular: Negative.        She has a hx of heart disease and wears pacemaker.   Musculoskeletal: Negative.   Skin: Negative.   Neurological: Negative.   Endo/Heme/Allergies: Negative.   Psychiatric/Behavioral: Negative.      Past Medical History:  Past Medical History:  Diagnosis Date  . Anxiety   . Arthritis   . 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 Depression   . Dyspnea   . Dysrhythmia   . Family history of breast cancer   . Family history of ovarian cancer   . GERD (gastroesophageal reflux disease)   . Headache    Migraines  . History of kidney stones   . On home oxygen therapy    4 l at night- Oxygen sat - 80's  . Presence of permanent cardiac pacemaker   . Pulmonary hypertension ([place])   . Tachycardia     Past Surgical History:  Past Surgical History:  Procedure Laterality Date  . ABDOMINAL HYSTERECTOMY    . BIOPSY  10/20/2020   Procedure: BIOPSY;  Surgeon: Irving Copas., MD;  Location: [place];  Service: Gastroenterology;;  . COLONOSCOPY WITH PROPOFOL N/A 08/04/2020   Procedure: COLONOSCOPY WITH PROPOFOL;  Surgeon: Jonathon Bellows, MD;  [facility] ENDOSCOPY;  Service: Gastroenterology;  Laterality: N/A;  . COLONOSCOPY WITH PROPOFOL N/A 08/11/2020   Procedure: COLONOSCOPY WITH PROPOFOL;  Surgeon: Jonathon Bellows, MD;  Location:  [facility] ENDOSCOPY;  Service: Gastroenterology;  Laterality: N/A;  . COLONOSCOPY WITH PROPOFOL N/A 10/20/2020   Procedure: COLONOSCOPY WITH PROPOFOL;  Surgeon: Rush Landmark Telford Nab., MD;  Location: [place];  Service: Gastroenterology;  Laterality: N/A;  . CYSTOSCOPY  05/12/2019   Procedure: CYSTOSCOPY;  Surgeon: Will Bonnet, MD;  Location: [facility] ORS;  Service: Gynecology;;  . Consuela Mimes    . ENDOSCOPIC MUCOSAL RESECTION N/A 10/20/2020   Procedure: ENDOSCOPIC MUCOSAL RESECTION;  Surgeon: Rush Landmark Telford Nab., MD;  Location: [place];  Service: Gastroenterology;  Laterality: N/A;  . ESOPHAGOGASTRODUODENOSCOPY (EGD) WITH PROPOFOL N/A 10/20/2020   Procedure: ESOPHAGOGASTRODUODENOSCOPY (EGD) WITH PROPOFOL;  Surgeon: Rush Landmark Telford Nab., MD;  Location: [place];  Service: Gastroenterology;  Laterality: N/A;  . heart monitor implant and removal    . HEMOSTASIS CLIP PLACEMENT  10/20/2020   Procedure: HEMOSTASIS CLIP PLACEMENT;  Surgeon: Irving Copas., MD;  Location: [place];  Service: Gastroenterology;;  . HOT HEMOSTASIS N/A 10/20/2020   Procedure: HOT HEMOSTASIS (ARGON PLASMA COAGULATION/BICAP);  Surgeon: Irving Copas., MD;  Location: [place];  Service: Gastroenterology;  Laterality: N/A;  . INSERT / REPLACE / REMOVE PACEMAKER    . LITHOTRIPSY  2012  . PACEMAKER INSERTION  2013  . POLYPECTOMY  10/20/2020   Procedure: POLYPECTOMY;  Surgeon: Mansouraty, Telford Nab., MD;  Location: [place];  Service: Gastroenterology;;  .  STENT REMOVAL    . TOTAL LAPAROSCOPIC HYSTERECTOMY WITH SALPINGECTOMY Bilateral 05/12/2019   Procedure: TOTAL LAPAROSCOPIC HYSTERECTOMY;  Surgeon: Will Bonnet, MD;  Location: [facility] ORS;  Service: Gynecology;  Laterality: Bilateral;  . TUBAL LIGATION  10/10/2011    Gynecologic History: Patient's last menstrual period was 04/14/2019 (exact date).  Obstetric History: [id number]  Family History:  Family History  Problem  Relation Age of Onset  . Healthy Mother   . Lung disease Father   . Heart disease Father   . Diabetes Father   . Hypertension Father   . Kidney cancer Father 41  . Colon cancer Paternal Grandfather 54  . Colon cancer Sister 98  . Breast cancer Paternal Aunt   . Ovarian cancer Paternal Aunt   . Stomach cancer Paternal Aunt   . Brain cancer Paternal Uncle   . Inflammatory bowel disease Neg Hx   . Liver disease Neg Hx   . Pancreatic cancer Neg Hx     Social History:  Social History   Socioeconomic History  . Marital status: Married    Spouse name: Not on file  . Number of children: Not on file  . Years of education: Not on file  . Highest education level: Not on file  Occupational History  . Not on file  Tobacco Use  . Smoking status: Former Smoker    Packs/day: 0.50    Types: Cigarettes    Quit date: 07/2020    Years since quitting: 0.5  . Smokeless tobacco: Never Used  Vaping Use  . Vaping Use: Every day  . Substances: Nicotine  Substance and Sexual Activity  . Alcohol use: Not Currently  . Drug use: Not Currently  . Sexual activity: Yes    Birth control/protection: Surgical    Comment: Tubal ligation/Hysterectomy  Other Topics Concern  . Not on file  Social History Narrative  . Not on file   Social Determinants of Health   Financial Resource Strain: Not on file  Food Insecurity: Not on file  Transportation Needs: Not on file  Physical Activity: Not on file  Stress: Not on file  Social Connections: Not on file  Intimate Partner Violence: Not on file    Allergies:  Allergies  Allergen Reactions  . Cyclosporine Other (See Comments)    Medication interactions  . Nitroglycerin Other (See Comments)    Medication interactions  . Other Other (See Comments)    Muscle relaxers - per patient interacts with other medications she is on.  . Tizanidine     Interaction with heart and lung medications  . Adhesive [Tape] Rash  . Tapentadol Rash     Medications: Prior to Admission medications   Medication Sig Start Date End Date Taking? Authorizing Provider  albuterol (VENTOLIN HFA) 108 (90 Base) MCG/ACT inhaler Inhale 2 puffs into the lungs every 6 (six) hours as needed for wheezing or shortness of breath.  11/10/18  Yes [provider]  ambrisentan (LETAIRIS) 10 MG tablet Take 10 mg by mouth daily.    Yes [provider]  amoxicillin-clavulanate (AUGMENTIN) 875-125 MG tablet Take 1 tablet by mouth 2 (two) times daily. 10/13/20  Yes [provider]  APO-VARENICLINE 0.5 MG tablet Take 0.5 mg by mouth daily. 10/11/20  Yes [provider]  benzonatate (TESSALON) 100 MG capsule Take 100 mg by mouth 3 (three) times daily as needed. 09/15/20  Yes [provider]  citalopram (CELEXA) 40 MG tablet Take 40 mg by mouth daily.   Yes  [provider]  diltiazem (CARDIZEM) 120 MG tablet Take 120-240 mg by mouth See admin instructions. Take 120 mg by mouth in the morning and take 240 mg by mouth at bedtime   Yes [provider]  famotidine (PEPCID) 20 MG tablet Take 80 mg by mouth 2 (two) times daily.   Yes [provider]  furosemide (LASIX) 20 MG tablet Take 20 mg by mouth daily. 05/17/20  Yes [provider]  metroNIDAZOLE (FLAGYL) 500 MG tablet Take 1 tablet (500 mg total) by mouth 2 (two) times daily for 7 days. 01/17/21 01/24/21 Yes Imagene Riches, CNM  pantoprazole (PROTONIX) 40 MG tablet Take 1 tablet (40 mg total) by mouth 2 (two) times daily before a meal. 10/20/20 10/20/21 Yes Mansouraty, Telford Nab., MD  Selexipag 1200 MCG TABS Take 1,200 mcg by mouth 2 (two) times daily.    Yes [provider]  tadalafil, PAH, (ADCIRCA) 20 MG tablet Take 40 mg by mouth daily. 06/04/19  Yes [provider]  traZODone (DESYREL) 50 MG tablet TAKE 3 TABLETS(150 MG) BY MOUTH AT BEDTIME AS NEEDED FOR SLEEP 01/10/21  Yes Veal, Katelyn, CNM  Na Sulfate-K Sulfate-Mg Sulf  (SUPREP BOWEL PREP KIT) 17.5-3.13-1.6 GM/177ML SOLN Take 1 kit by mouth as directed. 10/12/20   Mansouraty, Telford Nab., MD    Physical Exam Vitals:  Vitals:   01/17/21 1521  BP: 120/80   Patient's last menstrual period was 04/14/2019 (exact date).  Physical Exam Constitutional:      Appearance: Normal appearance. She is obese.  HENT:     Head: Normocephalic and atraumatic.     Nose: Nose normal.  Cardiovascular:     Rate and Rhythm: Normal rate and regular rhythm.     Pulses: Normal pulses.     Heart sounds: Normal heart sounds.  Pulmonary:     Effort: Pulmonary effort is normal.     Breath sounds: Normal breath sounds.  Abdominal:     General: Bowel sounds are normal.     Palpations: Abdomen is soft.  Genitourinary:    General: Normal vulva.     Vagina: Vaginal discharge present.     Comments: Scant vaginal discharge, no malodor noticed today. Pt has had a hysterectomy.  Musculoskeletal:        General: Normal range of motion.     Cervical back: Normal range of motion and neck supple.  Skin:    General: Skin is warm and dry.  Neurological:     General: No focal deficit present.     Mental Status: She is alert and oriented to person, place, and time.  Psychiatric:        Mood and Affect: Mood normal.        Behavior: Behavior normal.      Assessment: 40 y.o. [id number] with c/o vaginal odor and discharge   Plan: Problem List Items Addressed This Visit   None   Visit Diagnoses    Vaginal odor    -  Primary   Relevant Medications   metroNIDAZOLE (FLAGYL) 500 MG tablet   Other Relevant Orders   POCT Wet Prep ([place])   Cervicovaginal ancillary only   Vaginal discharge       Relevant Medications   metroNIDAZOLE (FLAGYL) 500 MG tablet   Other Relevant Orders   Cervicovaginal ancillary only     Will RX hr Metronidazole for presumptive BV. Nuswab sent and will contact her with the results. She is encouraged to make an appointment for  a Well Woman  Exam.  Imagene Riches, [place]  01/17/2021 5:04 PM

## 2021-01-19 ENCOUNTER — Encounter: Payer: Self-pay | Admitting: Obstetrics

## 2021-01-19 LAB — CERVICOVAGINAL ANCILLARY ONLY
Bacterial Vaginitis (gardnerella): POSITIVE — AB
Candida Glabrata: NEGATIVE
Candida Vaginitis: NEGATIVE
Comment: NEGATIVE
Comment: NEGATIVE
Comment: NEGATIVE
Comment: NEGATIVE
Trichomonas: NEGATIVE

## 2021-02-06 ENCOUNTER — Encounter: Payer: Self-pay | Admitting: Gastroenterology

## 2021-02-07 ENCOUNTER — Encounter: Payer: Self-pay | Admitting: Gastroenterology

## 2021-02-07 ENCOUNTER — Other Ambulatory Visit: Payer: Self-pay | Admitting: Obstetrics and Gynecology

## 2021-02-07 ENCOUNTER — Ambulatory Visit
Admission: RE | Admit: 2021-02-07 | Discharge: 2021-02-07 | Disposition: A | Discharge status: Home / Self Care | Payer: Medicare Other | Attending: Gastroenterology | Admitting: Gastroenterology

## 2021-02-07 ENCOUNTER — Other Ambulatory Visit: Payer: Self-pay

## 2021-02-07 ENCOUNTER — Ambulatory Visit: Payer: Medicare Other | Admitting: Anesthesiology

## 2021-02-07 ENCOUNTER — Encounter
Admission: RE | Disposition: A | Discharge status: Home / Self Care | Payer: Self-pay | Source: Home / Self Care | Attending: Gastroenterology

## 2021-02-07 DIAGNOSIS — Z888 Allergy status to other drugs, medicaments and biological substances status: Secondary | ICD-10-CM | POA: Diagnosis not present

## 2021-02-07 DIAGNOSIS — K209 Esophagitis, unspecified without bleeding: Secondary | ICD-10-CM | POA: Insufficient documentation

## 2021-02-07 DIAGNOSIS — Z800 Family history of malignant neoplasm of digestive organs: Secondary | ICD-10-CM | POA: Insufficient documentation

## 2021-02-07 DIAGNOSIS — Z90722 Acquired absence of ovaries, bilateral: Secondary | ICD-10-CM | POA: Insufficient documentation

## 2021-02-07 DIAGNOSIS — Z9981 Dependence on supplemental oxygen: Secondary | ICD-10-CM | POA: Insufficient documentation

## 2021-02-07 DIAGNOSIS — I272 Pulmonary hypertension, unspecified: Secondary | ICD-10-CM | POA: Diagnosis not present

## 2021-02-07 DIAGNOSIS — Z8051 Family history of malignant neoplasm of kidney: Secondary | ICD-10-CM | POA: Diagnosis not present

## 2021-02-07 DIAGNOSIS — G4709 Other insomnia: Secondary | ICD-10-CM

## 2021-02-07 DIAGNOSIS — Z881 Allergy status to other antibiotic agents status: Secondary | ICD-10-CM | POA: Insufficient documentation

## 2021-02-07 DIAGNOSIS — K2980 Duodenitis without bleeding: Secondary | ICD-10-CM | POA: Diagnosis not present

## 2021-02-07 DIAGNOSIS — Z8249 Family history of ischemic heart disease and other diseases of the circulatory system: Secondary | ICD-10-CM | POA: Diagnosis not present

## 2021-02-07 DIAGNOSIS — Z87891 Personal history of nicotine dependence: Secondary | ICD-10-CM | POA: Diagnosis not present

## 2021-02-07 DIAGNOSIS — I509 Heart failure, unspecified: Secondary | ICD-10-CM | POA: Insufficient documentation

## 2021-02-07 DIAGNOSIS — I110 Hypertensive heart disease with heart failure: Secondary | ICD-10-CM | POA: Insufficient documentation

## 2021-02-07 DIAGNOSIS — Z79899 Other long term (current) drug therapy: Secondary | ICD-10-CM | POA: Insufficient documentation

## 2021-02-07 DIAGNOSIS — Z950 Presence of cardiac pacemaker: Secondary | ICD-10-CM | POA: Insufficient documentation

## 2021-02-07 DIAGNOSIS — Z90710 Acquired absence of both cervix and uterus: Secondary | ICD-10-CM | POA: Diagnosis not present

## 2021-02-07 HISTORY — PX: ESOPHAGOGASTRODUODENOSCOPY (EGD) WITH PROPOFOL: SHX5813

## 2021-02-07 HISTORY — DX: Heart failure, unspecified: I50.9

## 2021-02-07 HISTORY — DX: Essential (primary) hypertension: I10

## 2021-02-07 SURGERY — ESOPHAGOGASTRODUODENOSCOPY (EGD) WITH PROPOFOL
Anesthesia: General

## 2021-02-07 MED ORDER — SODIUM CHLORIDE 0.9 % IV SOLN: INTRAVENOUS | Status: DC

## 2021-02-07 MED ORDER — MIDAZOLAM HCL 2 MG/2ML IJ SOLN
INTRAMUSCULAR | Status: DC | PRN
  Administered 2021-02-07: 2 mg via INTRAVENOUS

## 2021-02-07 MED ORDER — MIDAZOLAM HCL 2 MG/2ML IJ SOLN
INTRAMUSCULAR | Status: AC
  Filled 2021-02-07: qty 2

## 2021-02-07 MED ORDER — GLYCOPYRROLATE 0.2 MG/ML IJ SOLN
INTRAMUSCULAR | Status: DC | PRN
  Administered 2021-02-07: .2 mg via INTRAVENOUS

## 2021-02-07 MED ORDER — LIDOCAINE HCL (CARDIAC) PF 100 MG/5ML IV SOSY
PREFILLED_SYRINGE | INTRAVENOUS | Status: DC | PRN
  Administered 2021-02-07: 100 mg via INTRAVENOUS

## 2021-02-07 MED ORDER — PROPOFOL 10 MG/ML IV BOLUS
INTRAVENOUS | Status: DC | PRN
  Administered 2021-02-07: 30 mg via INTRAVENOUS
  Administered 2021-02-07: 70 mg via INTRAVENOUS

## 2021-02-07 MED ORDER — PROPOFOL 500 MG/50ML IV EMUL
INTRAVENOUS | Status: DC | PRN
  Administered 2021-02-07: 145 ug/kg/min via INTRAVENOUS

## 2021-02-07 NOTE — Anesthesia Preprocedure Evaluation (Signed)
Anesthesia Evaluation  Patient identified by MRN, date of birth, ID band Patient awake    Reviewed: Allergy & Precautions, NPO status , Patient's Chart, lab work & pertinent test results  Airway Mallampati: II  TM Distance: >3 FB Neck ROM: Full    Dental  (+) Missing, Dental Advisory Given,    Pulmonary shortness of breath and with exertion, COPD,  oxygen dependent, Patient abstained from smoking., former smoker,  Home oxygen 4LPM at night  idiopathic pulmonary hypertension (diagnosed 2012 in [place] while pregnant; started on nocturnal O2 ~ 05/2020)- managed by [facility] pulmonologist Dr. Harrietta Guardian. Last visit 08/05/20 following "reassuring hemodynamics on recent right heart catheterization".  Obesity likely playing a role in her continued symptomatic burden with activity.  She was referred to a bariatric team. Continue current [place] regiment (talalafil, ambrisentan, selexipag, furosemide). She was started on nocturnal O2 ~ August and was changed to 4L humidification with facemask due to epistaxis/nasal irritation.   Pulmonary exam normal  + decreased breath sounds      Cardiovascular hypertension, +CHF  Normal cardiovascular exam+ dysrhythmias (sinus node dysfunction s/p ppm 2013) + pacemaker  Rhythm:Regular Rate:Normal  RHC 06/02/20 [facility]) Conclusions:   Pulmonary arterial hypertension (mPAP 34, PVR 2.9 WU). Hemodynamics at  goal for treated [facility] patient.   Normal cardiac output/index.   Normal RA and PCW pressures.   In comparison to prior [place] from 08/2017 (report only), findings are  similar.    Echo 05/02/20 [facility]): Summary  1. The left ventricle is normal in size with normal wall thickness.  2. The left ventricular systolic function is normal, LVEF is visually  estimated at > 55%.  3. There is mild mitral valve regurgitation.  4. The left atrium is mildly dilated in size.  5. The right ventricle is mildly dilated in size, with  normal systolic  function.  6. There is no pulmonary hypertension, estimated pulmonary artery systolic  pressure is 32 mmHg.  7. There is mild to moderate pulmonic regurgitation.  8. The right atrium is mildly dilated in size.  9. Dilated pulmonary artery - mild.  10. Dilated pulmonary artery - mild.    Last cardiology visit with Victorino Sparrow, Rexford Maus on 05/02/20. Per 05/02/20 PPM interrogation [facility] Everywhere): "Normal pacemaker function A pace - 29.6% V pace - <0.1% Events - none Battery - 2.9V (ERI 2.81V) Underlying Rhythm - sb Continue remotes".   R heart cath:  Pulmonary arterial hypertension (mPAP 34, PVR 2.9 WU). Hemodynamics at  goal for treated [facility] patient.   Normal cardiac output/index.   Normal RA and PCW pressures.   In comparison to prior [place] from 08/2017 (report only), findings are  similar.    Neuro/Psych  Headaches, PSYCHIATRIC DISORDERS Anxiety Depression    GI/Hepatic Neg liver ROS, GERD  ,Polyp- underwent colonoscopy on 08/11/20 with several polyp resections; however, treatment for a 30 mm polyp in the proximal transverse colon was not successful. Patient was referred to Dr. Rush Landmark for resection of large polyp.    Endo/Other  Obesity BMI 38  Renal/GU negative Renal ROS  negative genitourinary   Musculoskeletal  (+) Arthritis , Osteoarthritis,    Abdominal (+) + obese,   Peds  Hematology negative hematology ROS (+)   Anesthesia Other Findings Past Medical History: No date: Anxiety No date: Arthritis No date: Bradycardia     Comment:  has pacemaker No date: CHF (congestive heart failure) (HCC) No date: Depression No date: Dyspnea No date: Dysrhythmia No date: Family history of breast  cancer No date: Family history of ovarian cancer No date: GERD (gastroesophageal reflux disease) No date: Headache     Comment:  Migraines No date: History of kidney stones No date: Hypertension No date: On home oxygen therapy      Comment:  4 l at night- Oxygen sat - 80's No date: Presence of permanent cardiac pacemaker No date: Pulmonary hypertension (HCC) No date: Tachycardia  Reproductive/Obstetrics negative OB ROS                             Anesthesia Physical  Anesthesia Plan  ASA: III  Anesthesia Plan: General   Post-op Pain Management:    Induction:   PONV Risk Score and Plan: 2 and Propofol infusion and TIVA  Airway Management Planned: Natural Airway and Simple Face Mask  Additional Equipment: None  Intra-op Plan:   Post-operative Plan:   Informed Consent: I have reviewed the patients History and Physical, chart, labs and discussed the procedure including the risks, benefits and alternatives for the proposed anesthesia with the patient or authorized representative who has indicated his/her understanding and acceptance.     Dental advisory given  Plan Discussed with: CRNA  Anesthesia Plan Comments:         Anesthesia Quick Evaluation

## 2021-02-07 NOTE — Anesthesia Procedure Notes (Signed)
Procedure Name: General with mask airway Performed by: Fletcher-Harrison, Skyah Hannon, CRNA Pre-anesthesia Checklist: Patient identified, Emergency Drugs available, Suction available and Patient being monitored Patient Re-evaluated:Patient Re-evaluated prior to induction Oxygen Delivery Method: Simple face mask Induction Type: IV induction Placement Confirmation: positive ETCO2 and CO2 detector Dental Injury: Teeth and Oropharynx as per pre-operative assessment        

## 2021-02-07 NOTE — Anesthesia Postprocedure Evaluation (Signed)
Anesthesia Post Note  Patient: Ernie Hew  Procedure(s) Performed: ESOPHAGOGASTRODUODENOSCOPY (EGD) WITH PROPOFOL (N/A )  Patient location during evaluation: Endoscopy Anesthesia Type: General Level of consciousness: awake and alert and oriented Pain management: pain level controlled Vital Signs Assessment: post-procedure vital signs reviewed and stable Respiratory status: spontaneous breathing Cardiovascular status: blood pressure returned to baseline Anesthetic complications: no   No complications documented.   Last Vitals:  Vitals:   02/07/21 0816 02/07/21 0836  BP: 98/62 109/65  Pulse: 80   Resp: (!) 22   Temp: 36.5 C   SpO2: 95%     Last Pain:  Vitals:   02/07/21 0836  TempSrc:   PainSc: 0-No pain                 Cassius Cullinane

## 2021-02-07 NOTE — Op Note (Signed)
[facility] Gastroenterology Patient Name: Rachel Wall Procedure Date: 02/07/2021 7:55 AM MRN: [id number] Account #: [id number] Date of Birth: 07-01-1981 Admit Type: Outpatient Age: 40 Room: [facility] ENDO ROOM 2 Gender: Female Note Status: Finalized Procedure:             Upper GI endoscopy Indications:           Follow-up of esophagitis Providers:             Jonathon Bellows MD, MD Referring MD:          No Local Md, MD (Referring MD) Medicines:             Monitored Anesthesia Care Complications:         No immediate complications. Procedure:             Pre-Anesthesia Assessment:                        - Prior to the procedure, a History and Physical was                         performed, and patient medications, allergies and                         sensitivities were reviewed. The patient's tolerance                         of previous anesthesia was reviewed.                        - The risks and benefits of the procedure and the                         sedation options and risks were discussed with the                         patient. All questions were answered and informed                         consent was obtained.                        - After reviewing the risks and benefits, the patient                         was deemed in satisfactory condition to undergo the                         procedure.                        - ASA Grade Assessment: II - A patient with mild                         systemic disease.                        After obtaining informed consent, the endoscope was                         passed under direct vision. Throughout the procedure,  the patient's blood pressure, pulse, and oxygen                         saturations were monitored continuously. The Endoscope                         was introduced through the mouth, and advanced to the                         third part of duodenum. The upper GI endoscopy  was                         accomplished with ease. The patient tolerated the                         procedure well. Findings:      The esophagus was normal.      The stomach was normal.      The cardia and gastric fundus were normal on retroflexion.      The examined duodenum was normal. Biopsies were taken with a cold       forceps for histology. Impression:            - Normal esophagus.                        - Normal stomach.                        - Normal examined duodenum. Biopsied. Recommendation:        - Await pathology results.                        - Discharge patient to home (with escort).                        - Resume previous diet.                        - Continue present medications.                        - Return to my office as previously scheduled. Procedure Code(s):     --- Professional ---                        [phone], Esophagogastroduodenoscopy, flexible,                         transoral; with biopsy, single or multiple Diagnosis Code(s):     --- Professional ---                        K20.90, Esophagitis, unspecified without bleeding CPT copyright 2019 American Medical Association. All rights reserved. The codes documented in this report are preliminary and upon coder review may  be revised to meet current compliance requirements. Jonathon Bellows, MD Jonathon Bellows MD, MD 02/07/2021 8:15:21 AM This report has been signed electronically. Number of Addenda: 0 Note Initiated On: 02/07/2021 7:55 AM Estimated Blood Loss:  Estimated blood loss: none.      [facility]

## 2021-02-07 NOTE — H&P (Signed)
Jonathon Bellows, MD [address] [place], [place], [place], [address] [address], [place], [place], [place], [address] Phone: [phone]  Fax: [phone]  Primary Care Physician:  System, Provider Not In   Pre-Procedure History & Physical: HPI:  Rachel Wall is a 40 y.o. female is here for an endoscopy    Past Medical History:  Diagnosis Date  . Anxiety   . Arthritis   . 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 CHF (congestive heart failure) ([place])   . Depression   . Dyspnea   . Dysrhythmia   . Family history of breast cancer   . Family history of ovarian cancer   . GERD (gastroesophageal reflux disease)   . Headache    Migraines  . History of kidney stones   . Hypertension   . On home oxygen therapy    4 l at night- Oxygen sat - 80's  . Presence of permanent cardiac pacemaker   . Pulmonary hypertension ([place])   . Tachycardia     Past Surgical History:  Procedure Laterality Date  . ABDOMINAL HYSTERECTOMY    . BIOPSY  10/20/2020   Procedure: BIOPSY;  Surgeon: Irving Copas., MD;  Location: [place];  Service: Gastroenterology;;  . COLONOSCOPY WITH PROPOFOL N/A 08/04/2020   Procedure: COLONOSCOPY WITH PROPOFOL;  Surgeon: Jonathon Bellows, MD;  Location: [facility] ENDOSCOPY;  Service: Gastroenterology;  Laterality: N/A;  . COLONOSCOPY WITH PROPOFOL N/A 08/11/2020   Procedure: COLONOSCOPY WITH PROPOFOL;  Surgeon: Jonathon Bellows, MD;  Location: [facility] ENDOSCOPY;  Service: Gastroenterology;  Laterality: N/A;  . COLONOSCOPY WITH PROPOFOL N/A 10/20/2020   Procedure: COLONOSCOPY WITH PROPOFOL;  Surgeon: Rush Landmark Telford Nab., MD;  Location: [place];  Service: Gastroenterology;  Laterality: N/A;  . CYSTOSCOPY  05/12/2019   Procedure: CYSTOSCOPY;  Surgeon: Will Bonnet, MD;  Location: [facility]S;  Service: Gynecology;;  . Consuela Mimes    . ENDOSCOPIC MUCOSAL RESECTION N/A 10/20/2020   Procedure: ENDOSCOPIC MUCOSAL RESECTION;  Surgeon: Rush Landmark Telford Nab., MD;   Location: [place];  Service: Gastroenterology;  Laterality: N/A;  . ESOPHAGOGASTRODUODENOSCOPY (EGD) WITH PROPOFOL N/A 10/20/2020   Procedure: ESOPHAGOGASTRODUODENOSCOPY (EGD) WITH PROPOFOL;  Surgeon: Rush Landmark Telford Nab., MD;  Location: [place];  Service: Gastroenterology;  Laterality: N/A;  . heart monitor implant and removal    . HEMOSTASIS CLIP PLACEMENT  10/20/2020   Procedure: HEMOSTASIS CLIP PLACEMENT;  Surgeon: Irving Copas., MD;  Location: [place];  Service: Gastroenterology;;  . HOT HEMOSTASIS N/A 10/20/2020   Procedure: HOT HEMOSTASIS (ARGON PLASMA COAGULATION/BICAP);  Surgeon: Irving Copas., MD;  Location: [place];  Service: Gastroenterology;  Laterality: N/A;  . INSERT / REPLACE / REMOVE PACEMAKER    . LITHOTRIPSY  2012  . PACEMAKER INSERTION  2013  . POLYPECTOMY  10/20/2020   Procedure: POLYPECTOMY;  Surgeon: Mansouraty, Telford Nab., MD;  Location: [place];  Service: Gastroenterology;;  . STENT REMOVAL    . TOTAL LAPAROSCOPIC HYSTERECTOMY WITH SALPINGECTOMY Bilateral 05/12/2019   Procedure: TOTAL LAPAROSCOPIC HYSTERECTOMY;  Surgeon: Will Bonnet, MD;  Location: [facility];  Service: Gynecology;  Laterality: Bilateral;  . TUBAL LIGATION  10/10/2011    Prior to Admission medications   Medication Sig Start Date End Date Taking? Authorizing Provider  ambrisentan (LETAIRIS) 10 MG tablet Take 10 mg by mouth daily.    Yes [provider]  citalopram (CELEXA) 40 MG tablet Take 40 mg by mouth daily.   Yes [provider]  diltiazem (CARDIZEM) 120  MG tablet Take 120-240 mg by mouth See admin instructions. Take 120 mg by mouth in the morning and take 240 mg by mouth at bedtime   Yes [provider]  famotidine (PEPCID) 20 MG tablet Take 80 mg by mouth 2 (two) times daily.   Yes [provider]  furosemide (LASIX) 20 MG tablet Take 20 mg by mouth daily. 05/17/20  Yes [provider]  pantoprazole  (PROTONIX) 40 MG tablet Take 1 tablet (40 mg total) by mouth 2 (two) times daily before a meal. 10/20/20 10/20/21 Yes Mansouraty, Telford Nab., MD  Selexipag 1200 MCG TABS Take 1,200 mcg by mouth 2 (two) times daily.    Yes [provider]  tadalafil, PAH, (ADCIRCA) 20 MG tablet Take 40 mg by mouth daily. 06/04/19  Yes [provider]  traZODone (DESYREL) 50 MG tablet TAKE 3 TABLETS(150 MG) BY MOUTH AT BEDTIME AS NEEDED FOR SLEEP 01/10/21  Yes Veal, Katelyn, CNM  albuterol (VENTOLIN HFA) 108 (90 Base) MCG/ACT inhaler Inhale 2 puffs into the lungs every 6 (six) hours as needed for wheezing or shortness of breath.  11/10/18   [provider]  amoxicillin-clavulanate (AUGMENTIN) 875-125 MG tablet Take 1 tablet by mouth 2 (two) times daily. Patient not taking: Reported on 02/07/2021 10/13/20   [provider]  APO-VARENICLINE 0.5 MG tablet Take 0.5 mg by mouth daily. 10/11/20   [provider]  benzonatate (TESSALON) 100 MG capsule Take 100 mg by mouth 3 (three) times daily as needed. Patient not taking: Reported on 02/07/2021 09/15/20   [provider]    Allergies as of 12/30/2020 - Review Complete 10/20/2020  Allergen Reaction Noted  . Cyclosporine Other (See Comments) 05/05/2019  . Nitroglycerin Other (See Comments) 05/05/2019  . Other Other (See Comments) 10/06/2018  . Tizanidine  07/01/2020  . Adhesive [tape] Rash 05/12/2019  . Tapentadol Rash 05/12/2019    Family History  Problem Relation Age of Onset  . Healthy Mother   . Lung disease Father   . Heart disease Father   . Diabetes Father   . Hypertension Father   . Kidney cancer Father 44  . Colon cancer Paternal Grandfather 54  . Colon cancer Sister 79  . Breast cancer Paternal Aunt   . Ovarian cancer Paternal Aunt   . Stomach cancer Paternal Aunt   . Brain cancer Paternal Uncle   . Inflammatory bowel disease Neg Hx   . Liver disease Neg Hx   . Pancreatic cancer Neg Hx     Social  History   Socioeconomic History  . Marital status: Married    Spouse name: Not on file  . Number of children: Not on file  . Years of education: Not on file  . Highest education level: Not on file  Occupational History  . Not on file  Tobacco Use  . Smoking status: Former Smoker    Packs/day: 0.50    Types: Cigarettes    Quit date: 07/2020    Years since quitting: 0.6  . Smokeless tobacco: Never Used  Vaping Use  . Vaping Use: Every day  . Substances: Nicotine  Substance and Sexual Activity  . Alcohol use: Not Currently  . Drug use: Not Currently  . Sexual activity: Yes    Birth control/protection: Surgical    Comment: Tubal ligation/Hysterectomy  Other Topics Concern  . Not on file  Social History Narrative  . Not on file   Social Determinants of Health   Financial Resource Strain: Not  on file  Food Insecurity: Not on file  Transportation Needs: Not on file  Physical Activity: Not on file  Stress: Not on file  Social Connections: Not on file  Intimate Partner Violence: Not on file    Review of Systems: See HPI, otherwise negative ROS  Physical Exam: BP 113/73   Pulse 74   Temp (!) 97.3 F (36.3 C) (Temporal)   Resp 17   Ht 5\' 9"  (1.753 m)   Wt 114.3 kg   LMP 04/14/2019 (Exact Date)   SpO2 100%   BMI 37.21 kg/m  General:   Alert,  pleasant and cooperative in NAD Head:  Normocephalic and atraumatic. Neck:  Supple; no masses or thyromegaly. Lungs:  Clear throughout to auscultation, normal respiratory effort.    Heart:  +S1, +S2, Regular rate and rhythm, No edema. Abdomen:  Soft, nontender and nondistended. Normal bowel sounds, without guarding, and without rebound.   Neurologic:  Alert and  oriented x4;  grossly normal neurologically.  Impression/Plan: Rachel Wall is here for an endoscopy  to be performed for  evaluation of esophagitis    Risks, benefits, limitations, and alternatives regarding endoscopy have been reviewed with the patient.   Questions have been answered.  All parties agreeable.   Jonathon Bellows, MD  02/07/2021, 7:55 AM

## 2021-02-07 NOTE — Transfer of Care (Signed)
Immediate Anesthesia Transfer of Care Note  Patient: Rachel Wall  Procedure(s) Performed: ESOPHAGOGASTRODUODENOSCOPY (EGD) WITH PROPOFOL (N/A )  Patient Location: Endoscopy Unit  Anesthesia Type:General  Level of Consciousness: awake, drowsy and patient cooperative  Airway & Oxygen Therapy: Patient Spontanous Breathing  Post-op Assessment: Report given to RN and Post -op Vital signs reviewed and stable  Post vital signs: Reviewed and stable  Last Vitals:  Vitals Value Taken Time  BP 98/62 02/07/21 0817  Temp 36.5 C 02/07/21 0816  Pulse 79 02/07/21 0817  Resp 18 02/07/21 0817  SpO2 95 % 02/07/21 0817  Vitals shown include unvalidated device data.  Last Pain:  Vitals:   02/07/21 0816  TempSrc: Temporal  PainSc: 0-No pain         Complications: No complications documented.

## 2021-02-08 ENCOUNTER — Encounter: Payer: Self-pay | Admitting: Gastroenterology

## 2021-02-08 LAB — SURGICAL PATHOLOGY

## 2021-05-23 ENCOUNTER — Other Ambulatory Visit: Payer: Self-pay | Admitting: Gastroenterology

## 2021-07-03 ENCOUNTER — Ambulatory Visit: Payer: Medicare Other | Admitting: Obstetrics and Gynecology

## 2021-10-25 ENCOUNTER — Encounter: Payer: Self-pay | Admitting: Obstetrics

## 2021-10-25 ENCOUNTER — Ambulatory Visit (INDEPENDENT_AMBULATORY_CARE_PROVIDER_SITE_OTHER): Payer: Medicare Other | Admitting: Obstetrics

## 2021-10-25 ENCOUNTER — Other Ambulatory Visit: Payer: Self-pay

## 2021-10-25 VITALS — BP 126/84 | Ht 69.0 in | Wt 272.0 lb

## 2021-10-25 DIAGNOSIS — Z01419 Encounter for gynecological examination (general) (routine) without abnormal findings: Secondary | ICD-10-CM

## 2021-10-25 DIAGNOSIS — Z1231 Encounter for screening mammogram for malignant neoplasm of breast: Secondary | ICD-10-CM | POA: Diagnosis not present

## 2021-10-25 DIAGNOSIS — N898 Other specified noninflammatory disorders of vagina: Secondary | ICD-10-CM | POA: Diagnosis not present

## 2021-10-25 MED ORDER — METRONIDAZOLE 0.75 % VA GEL: 1.0000 | Freq: Every day | VAGINAL | 1 refills | Status: AC

## 2021-10-25 NOTE — Progress Notes (Signed)
Gynecology Annual Exam  PCP: System, Provider Not In  Chief Complaint:  Chief Complaint  Patient presents with   Annual Exam    History of Present Illness: Patient is a 41 y.o. [id number] presents for annual exam. The patient has no complaints today. She is set up for gastric bypass in a week. Very excited about her upcoming surgery.  LMP: Patient's last menstrual period was 04/14/2019 (exact date).she has had a hysterectomy. A The patient is occasionally  sexually active.  She is married, and shares that her partner has an ETOH abuse hx that is ongoing.he currently uses status post hysterectomy for contraception. She denies dyspareunia.  The patient does not perform self breast exams.  There is notable family history of breast or ovarian cancer in her family.She has had testing.  The patient wears seatbelts: yes.   The patient has regular exercise:  walking .    The patient reports current symptoms of depression.  She has been on Celexa for many years and feels that it is not working for her.  Review of Systems: Review of Systems  Constitutional: Negative.   HENT: Negative.    Eyes: Negative.   Respiratory: Negative.    Cardiovascular: Negative.   Gastrointestinal: Negative.   Genitourinary: Negative.   Musculoskeletal: Negative.   Skin: Negative.   Neurological: Negative.   Endo/Heme/Allergies: Negative.   Psychiatric/Behavioral:  Positive for depression. The patient is nervous/anxious.    Past Medical History:  Patient Active Problem List   Diagnosis Date Noted   Esophagitis determined by endoscopy 12/30/2020   Genetic testing 12/19/2020    Negative genetic testing on the Multi-cancer+RNA panel.  BLM c.1936A>G (p.Ser646Gly) VUS identified.  The Multi-Gene Panel offered by [facility] includes sequencing and/or deletion duplication testing of the following 84 genes: AIP, ALK, APC, ATM, AXIN2,BAP1,  BARD1, BLM, BMPR1A, BRCA1, BRCA2, BRIP1, CASR, CDC73, CDH1, CDK4, CDKN1B,  CDKN1C, CDKN2A (p14ARF), CDKN2A (p16INK4a), CEBPA, CHEK2, CTNNA1, DICER1, DIS3L2, EGFR (c.2369C>T, p.Thr790Met variant only), EPCAM (Deletion/duplication testing only), FH, FLCN, GATA2, GPC3, GREM1 (Promoter region deletion/duplication testing only), HOXB13 (c.251G>A, p.Gly84Glu), HRAS, KIT, MAX, MEN1, MET, MITF (c.952G>A, p.Glu318Lys variant only), MLH1, MSH2, MSH3, MSH6, MUTYH, NBN, NF1, NF2, NTHL1, PALB2, PDGFRA, PHOX2B, PMS2, POLD1, POLE, POT1, PRKAR1A, PTCH1, PTEN, RAD50, RAD51C, RAD51D, RB1, RECQL4, RET, RUNX1, SDHAF2, SDHA (sequence changes only), SDHB, SDHC, SDHD, SMAD4, SMARCA4, SMARCB1, SMARCE1, STK11, SUFU, TERC, TERT, TMEM127, TP53, TSC1, TSC2, VHL, WRN and WT1.  The report date is December 17, 2020.    Family history of breast cancer 10/31/2020   Family history of ovarian cancer 10/31/2020   Family history of uterine cancer 10/31/2020   Adenomatous polyp of transverse colon 10/12/2020   Family history of colon cancer 10/12/2020   Bloating 10/12/2020   Pyrosis 10/12/2020   Family history of celiac disease 10/12/2020   History of colonic polyps 07/13/2020   Fibroid uterus 05/12/2019   Pulmonary arterial hypertension ([place]) 05/12/2019   Status post laparoscopic hysterectomy 05/12/2019   Menorrhagia with irregular cycle 12/19/2018   Tobacco use 11/20/2018    Past Surgical History:  Past Surgical History:  Procedure Laterality Date   ABDOMINAL HYSTERECTOMY     BIOPSY  10/20/2020   Procedure: BIOPSY;  Surgeon: Irving Copas., MD;  Location: [place];  Service: Gastroenterology;;   COLONOSCOPY WITH PROPOFOL N/A 08/04/2020   Procedure: COLONOSCOPY WITH PROPOFOL;  Surgeon: Jonathon Bellows, MD;  Location: [facility] ENDOSCOPY;  Service: Gastroenterology;  Laterality: N/A;   COLONOSCOPY WITH PROPOFOL N/A 08/11/2020  Procedure: COLONOSCOPY WITH PROPOFOL;  Surgeon: Jonathon Bellows, MD;  Location: [facility] ENDOSCOPY;  Service: Gastroenterology;  Laterality: N/A;   COLONOSCOPY WITH PROPOFOL N/A  10/20/2020   Procedure: COLONOSCOPY WITH PROPOFOL;  Surgeon: Rush Landmark Telford Nab., MD;  Location: [place];  Service: Gastroenterology;  Laterality: N/A;   CYSTOSCOPY  05/12/2019   Procedure: CYSTOSCOPY;  Surgeon: Will Bonnet, MD;  Location: [facility];  Service: Gynecology;;   CYSTOSCOPY     ENDOSCOPIC MUCOSAL RESECTION N/A 10/20/2020   Procedure: ENDOSCOPIC MUCOSAL RESECTION;  Surgeon: Irving Copas., MD;  Location: [place];  Service: Gastroenterology;  Laterality: N/A;   ESOPHAGOGASTRODUODENOSCOPY (EGD) WITH PROPOFOL N/A 10/20/2020   Procedure: ESOPHAGOGASTRODUODENOSCOPY (EGD) WITH PROPOFOL;  Surgeon: Rush Landmark Telford Nab., MD;  Location: [place];  Service: Gastroenterology;  Laterality: N/A;   ESOPHAGOGASTRODUODENOSCOPY (EGD) WITH PROPOFOL N/A 02/07/2021   Procedure: ESOPHAGOGASTRODUODENOSCOPY (EGD) WITH PROPOFOL;  Surgeon: Jonathon Bellows, MD;  Location: [facility] ENDOSCOPY;  Service: Gastroenterology;  Laterality: N/A;   heart monitor implant and removal     HEMOSTASIS CLIP PLACEMENT  10/20/2020   Procedure: HEMOSTASIS CLIP PLACEMENT;  Surgeon: Irving Copas., MD;  Location: [place];  Service: Gastroenterology;;   HOT HEMOSTASIS N/A 10/20/2020   Procedure: HOT HEMOSTASIS (ARGON PLASMA COAGULATION/BICAP);  Surgeon: Irving Copas., MD;  Location: [place];  Service: Gastroenterology;  Laterality: N/A;   INSERT / REPLACE / REMOVE PACEMAKER     LITHOTRIPSY  2012   PACEMAKER INSERTION  2013   POLYPECTOMY  10/20/2020   Procedure: POLYPECTOMY;  Surgeon: Rush Landmark Telford Nab., MD;  Location: [place];  Service: Gastroenterology;;   Lavell Islam REMOVAL     TOTAL LAPAROSCOPIC HYSTERECTOMY WITH SALPINGECTOMY Bilateral 05/12/2019   Procedure: TOTAL LAPAROSCOPIC HYSTERECTOMY;  Surgeon: Will Bonnet, MD;  Location: [facility];  Service: Gynecology;  Laterality: Bilateral;   TUBAL LIGATION  10/10/2011    Gynecologic History:  Patient's last menstrual  period was 04/14/2019 (exact date). Contraception: status post hysterectomy Last Pap: Results were: 2021 no abnormalities  Last mammogram: many years in [place] Results were: unknown- RAD1 per the patient  Obstetric History: [id number]  Family History:  Family History  Problem Relation Age of Onset   Healthy Mother    Lung disease Father    Heart disease Father    Diabetes Father    Hypertension Father    Kidney cancer Father 46   Colon cancer Paternal Grandfather 48   Colon cancer Sister 97   Breast cancer Paternal Aunt    Ovarian cancer Paternal Aunt    Stomach cancer Paternal Aunt    Brain cancer Paternal Uncle    Inflammatory bowel disease Neg Hx    Liver disease Neg Hx    Pancreatic cancer Neg Hx     Social History:  Social History   Socioeconomic History   Marital status: Married    Spouse name: Not on file   Number of children: Not on file   Years of education: Not on file   Highest education level: Not on file  Occupational History   Not on file  Tobacco Use   Smoking status: Former    Packs/day: 0.50    Types: Cigarettes    Quit date: 07/2020    Years since quitting: 1.3   Smokeless tobacco: Never  Vaping Use   Vaping Use: Every day   Substances: Nicotine  Substance and Sexual Activity   Alcohol use: Not Currently   Drug use: Not Currently   Sexual activity: Yes  Birth control/protection: Surgical    Comment: Tubal ligation/Hysterectomy  Other Topics Concern   Not on file  Social History Narrative   Not on file   Social Determinants of Health   Financial Resource Strain: Not on file  Food Insecurity: Not on file  Transportation Needs: Not on file  Physical Activity: Not on file  Stress: Not on file  Social Connections: Not on file  Intimate Partner Violence: Not on file    Allergies:  Allergies  Allergen Reactions   Cyclosporine Other (See Comments)    Medication interactions   Nitroglycerin Other (See Comments)    Medication  interactions   Other Other (See Comments)    Muscle relaxers - per patient interacts with other medications she is on.   Tizanidine     Interaction with heart and lung medications   Adhesive [Tape] Rash   Tapentadol Rash    Medications: Prior to Admission medications   Medication Sig Start Date End Date Taking? Authorizing Provider  albuterol (VENTOLIN HFA) 108 (90 Base) MCG/ACT inhaler Inhale 2 puffs into the lungs every 6 (six) hours as needed for wheezing or shortness of breath.  11/10/18  Yes [provider]  ambrisentan (LETAIRIS) 10 MG tablet Take 10 mg by mouth daily.    Yes [provider]  APO-VARENICLINE 0.5 MG tablet Take 0.5 mg by mouth daily. 10/11/20  Yes [provider]  citalopram (CELEXA) 40 MG tablet Take 40 mg by mouth daily.   Yes [provider]  diltiazem (CARDIZEM) 120 MG tablet Take 120-240 mg by mouth See admin instructions. Take 120 mg by mouth in the morning and take 240 mg by mouth at bedtime   Yes [provider]  famotidine (PEPCID) 20 MG tablet Take 80 mg by mouth 2 (two) times daily.   Yes [provider]  furosemide (LASIX) 20 MG tablet Take 40 mg by mouth daily. 05/17/20  Yes [provider]  Selexipag 1200 MCG TABS Take 1,200 mcg by mouth 2 (two) times daily.    Yes [provider]  tadalafil, PAH, (ADCIRCA) 20 MG tablet Take 40 mg by mouth daily. 06/04/19  Yes [provider]  traZODone (DESYREL) 50 MG tablet TAKE 3 TABLETS(150 MG) BY MOUTH AT BEDTIME AS NEEDED FOR SLEEP 01/10/21  Yes Veal, Katelyn, CNM  pantoprazole (PROTONIX) 40 MG tablet Take 1 tablet (40 mg total) by mouth 2 (two) times daily before a meal. 10/20/20 10/20/21  Mansouraty, Telford Nab., MD    Physical Exam Vitals: Blood pressure 126/84, height _0  (1.753 m), weight 272 lb (123.4 kg), last menstrual period 04/14/2019.  General: NAD HEENT: normocephalic, anicteric Thyroid: no enlargement, no palpable  nodules Pulmonary: No increased work of breathing, CTAB Cardiovascular: RRR, distal pulses 2+ Breast: Breast symmetrical, no tenderness, no palpable nodules or masses, no skin or nipple retraction present, no nipple discharge.  No axillary or supraclavicular lymphadenopathy. Abdomen:  ++ adipose, NABS, soft, non-tender, non-distended.  Umbilicus without lesions.  No hepatomegaly, splenomegaly or masses palpable. No evidence of hernia  Genitourinary:  External: Normal external female genitalia.  Normal urethral meatus, normal Bartholin's and Skene's glands.    Vagina: Normal vaginal mucosa, no evidence of prolapse.    Cervix: Grossly normal in appearance, no bleeding   Adnexa: ovaries non-enlarged, no adnexal masses  Rectal: deferred  Lymphatic: no evidence of inguinal lymphadenopathy Extremities: no edema, erythema, or tenderness Neurologic: Grossly intact Psychiatric: mood appropriate, affect full  Female chaperone present for pelvic and breast  portions of the physical exam    Assessment: 41 y.o. [id number] routine annual exam Hx of hysterectomy Morbid obesity Hx of derpession and anxiety In need of a Primary MD or PCP Over due for a mammogram Planning gastric bypass. Plan: Problem List Items Addressed This Visit   None Visit Diagnoses     Women's annual routine gynecological examination    -  Primary       1) Mammogram - recommend yearly screening mammogram.  Mammogram Was ordered today   2) STI screening  wasoffered and declined  3) ASCCP guidelines and rational discussed.  Patient opts for every 5 years screening interval  4) Contraception - the patient is currently using  status post hysterectomy.  She is happy with her current form of contraception and plans to continue  5) Colonoscopy -- Screening recommended starting at age 53 for average risk individuals, age 21 for individuals deemed at increased risk (including African Americans) and recommended to continue  until age 80.  For patient age 46-85 individualized approach is recommended.  Gold standard screening is via colonoscopy, Cologuard screening is an acceptable alternative for patient unwilling or unable to undergo colonoscopy.  "Colorectal cancer screening for average?risk adults: 2018 guideline update from the American Cancer Society"[place]: A Cancer Journal for Clinicians: Feb 27, 2017   6) Routine healthcare maintenance including cholesterol, diabetes screening discussed managed by PCP  Encouraged her to investigate [place] for support with her spouses' ETOH abuse. She is interested in a problem visit with an MD to address hot flashes and menopausal symptoms. With her cardiac history will make an appt with a [facility] MD.  7) Return in about 1 year (around 10/25/2022) for annual. Imagene Riches, CNM  10/25/2021 1:32 PM   [facility] 10/25/2021, 1:32 PM

## 2021-11-02 ENCOUNTER — Other Ambulatory Visit: Payer: Self-pay | Admitting: Obstetrics

## 2021-11-02 DIAGNOSIS — Z1231 Encounter for screening mammogram for malignant neoplasm of breast: Secondary | ICD-10-CM

## 2021-11-08 ENCOUNTER — Encounter: Payer: Self-pay | Admitting: Obstetrics and Gynecology

## 2021-11-29 ENCOUNTER — Encounter: Payer: Self-pay | Admitting: Obstetrics & Gynecology

## 2021-11-29 ENCOUNTER — Other Ambulatory Visit: Payer: Self-pay

## 2021-11-29 ENCOUNTER — Ambulatory Visit (INDEPENDENT_AMBULATORY_CARE_PROVIDER_SITE_OTHER): Payer: Medicare Other | Admitting: Obstetrics & Gynecology

## 2021-11-29 VITALS — BP 100/60 | Ht 68.0 in | Wt 250.0 lb

## 2021-11-29 DIAGNOSIS — Z780 Asymptomatic menopausal state: Secondary | ICD-10-CM

## 2021-11-29 MED ORDER — ESTRADIOL 1 MG PO TABS: 1.0000 mg | ORAL_TABLET | Freq: Every day | ORAL | 11 refills | Status: DC

## 2021-11-29 NOTE — Progress Notes (Signed)
?HPI: ?     Ms. Rachel Wall is a 41 y.o. [phone] who is feeling too young to be menopausal but that is what sx's she is having, and she has a family history of early menopause in her mother and sisters and aunts; presents today for a problem visit.  She complains of anxiety, decreased libido, depression, hot flashes, insomnia, moodiness, no energy.   Symptoms have been present for 2 years.  She had [place] 2020, so no periods during this time.  She has had labs on 2 different occasions w normal LH, FSH, TSH.  Symptoms are mod to severe and has led her to come in today to seek options for intervention. ? ?Previous Treatment: none ? ?She is single partner, contraception - status post hysterectomy. Denies PostMenopausal/ Post op Bleeding ? ?PMHx: ?She  has a past medical history of Anxiety, Arthritis, Bradycardia, CHF (congestive heart failure) ([place]), Depression, Dyspnea, Dysrhythmia, Family history of breast cancer, Family history of ovarian cancer, GERD (gastroesophageal reflux disease), Headache, History of kidney stones, Hypertension, On home oxygen therapy, Presence of permanent cardiac pacemaker, Pulmonary hypertension ([place]), and Tachycardia. Also,  has a past surgical history that includes Tubal ligation (10/10/2011); Pacemaker insertion (2013); Stent removal; Lithotripsy (2012); Insert / replace / remove pacemaker; heart monitor implant and removal; Total laparoscopic hysterectomy with salpingectomy (Bilateral, 05/12/2019); Cystoscopy (05/12/2019); Colonoscopy with propofol (N/A, 08/04/2020); Colonoscopy with propofol (N/A, 08/11/2020); Cystoscopy; Abdominal hysterectomy; Colonoscopy with propofol (N/A, 10/20/2020); Endoscopic mucosal resection (N/A, 10/20/2020); Esophagogastroduodenoscopy (egd) with propofol (N/A, 10/20/2020); biopsy (10/20/2020); polypectomy (10/20/2020); Hot hemostasis (N/A, 10/20/2020); Hemostasis clip placement (10/20/2020); and Esophagogastroduodenoscopy (egd) with propofol (N/A, 02/07/2021)., family  history includes Brain cancer in her paternal uncle; Breast cancer in her paternal aunt; Colon cancer (age of onset: 46) in her sister; Colon cancer (age of onset: 37) in her paternal grandfather; Diabetes in her father; Healthy in her mother; Heart disease in her father; Hypertension in her father; Kidney cancer (age of onset: 33) in her father; Lung disease in her father; Ovarian cancer in her paternal aunt; Stomach cancer in her paternal aunt.,  reports that she quit smoking about 16 months ago. Her smoking use included cigarettes. She smoked an average of .5 packs per day. She has never used smokeless tobacco. She reports that she does not currently use alcohol. She reports that she does not currently use drugs. ? ?She has a current medication list which includes the following prescription(s): albuterol, ambrisentan, apo-varenicline, citalopram, diltiazem, estradiol, famotidine, furosemide, selexipag, tadalafil (pah), trazodone, and pantoprazole. Also, is allergic to cyclosporine, nitroglycerin, other, tizanidine, adhesive [tape], and tapentadol. ? ?Review of Systems  ?Constitutional:  Positive for malaise/fatigue and weight loss. Negative for chills and fever.  ?HENT:  Negative for congestion, sinus pain and sore throat.   ?Eyes:  Negative for blurred vision and pain.  ?Respiratory:  Negative for cough and wheezing.   ?Cardiovascular:  Negative for chest pain and leg swelling.  ?Gastrointestinal:  Negative for abdominal pain, constipation, diarrhea, heartburn, nausea and vomiting.  ?Genitourinary:  Negative for dysuria, frequency, hematuria and urgency.  ?Musculoskeletal:  Negative for back pain, joint pain, myalgias and neck pain.  ?Skin:  Negative for itching and rash.  ?Neurological:  Negative for dizziness, tremors and weakness.  ?Endo/Heme/Allergies:  Does not bruise/bleed easily.  ?Psychiatric/Behavioral:  Positive for depression. The patient is nervous/anxious. The patient does not have insomnia.    ? ?Objective: ?BP 100/60   Ht 5\' 8"  (1.727 m)   Wt 250 lb (113.4 kg)  LMP 04/14/2019 (Exact Date)   BMI 38.01 kg/m?  ?Physical Exam ?Constitutional:   ?   General: She is not in acute distress. ?   Appearance: She is well-developed.  ?Musculoskeletal:     ?   General: Normal range of motion.  ?Neurological:  ?   Mental Status: She is alert and oriented to person, place, and time.  ?Skin: ?   General: Skin is warm and dry.  ?Vitals reviewed.  ? ? ?ASSESSMENT/PLAN:  Menopause. ?HRT ?I have discussed HRT with the patient in detail.  The risk/benefits of it were reviewed.  She understands that during menopause Estrogen decreases dramatically and that this results in an increased risk of cardiovascular disease as well as osteoporosis.  We have also discussed the fact that hot flashes often result from a decrease in Estrogen, and that by replacing Estrogen, they can often be alleviated.  We have discussed skin, vaginal and urinary tract changes that may also take place from this drop in Estrogen.  Emotional changes have also been linked to Estrogen and we have briefly discussed this.  The benefits of HRT including decrease in hot flashes, vaginal dryness, and osteoporosis were discussed.  The emotional benefit and a possible change in her cardiovascular risk profile was also reviewed.  The risks associated with Hormone Replacement Therapy were also reviewed.  The use of unopposed Estrogen and its relationship to endometrial cancer was discussed.  The addition of Progesterone and its beneficial effect on endometrial cancer was also noted.  The fact that there has been no consistent definitive studies showing an increase in breast cancer in women who use HRT was discussed with the patient.  The possible side effects including breast tenderness, fluid retention, mood changes and vaginal bleeding were discussed.  The patient was informed that this is an elective medication and that she may choose not to take Hormone  Replacement Therapy.  Literature on HRT was given, and I believe that after answering all of the patient?s questions, she has an adequate and informed understanding of HRT.  Special emphasis on the WHI study, as well as several studies since that pertaining to the risks and benefits of estrogen replacement therapy were compared.  The possible limitations of these studies were discussed including the age stratification of the WHI study.  The possible role of Progesterone in these studies was discussed in detail.  I believe that the patient has an informed knowledge of the risks and benefits of HRT.  I have specifically discussed WHI findings and current updates.  Different type of hormone formulation and methods of taking hormone replacement therapy discussed.  ? ?Plan Estradiol 1 mg daily to start ?Alternatives discussed as well ? ? ? ?Barnett Applebaum, MD, FACOG ?[facility], [place] ?11/29/2021  2:04 PM ? ?

## 2021-11-29 NOTE — Patient Instructions (Signed)
Estradiol Tablets ?What is this medication? ?ESTRADIOL (es tra DYE ole) reduces the number and severity of hot flashes due to menopause. It may also help relieve the symptoms of menopause, such as vaginal irritation, dryness, or pain during sex. It can be used to prevent osteoporosis after menopause. It is also used to reduce the symptoms of late-stage breast or prostate cancer. It works by increasing levels of the hormone estrogen in the body. This medication is an estrogen hormone. ?This medicine may be used for other purposes; ask your health care provider or pharmacist if you have questions. ?COMMON BRAND NAME(S): Estrace, Femtrace, Gynodiol ?What should I tell my care team before I take this medication? ?They need to know if you have or ever had any of these conditions: ?Abnormal vaginal bleeding ?Blood vessel disease or blood clots ?Breast, cervical, endometrial, ovarian, liver, or uterine cancer ?Dementia ?Diabetes ?Gallbladder disease ?Heart disease or recent heart attack ?High blood pressure ?High cholesterol ?High level of calcium in the blood ?Hysterectomy ?Kidney disease ?Liver disease ?Migraine headaches ?Protein C deficiency ?Protein S deficiency ?Stroke ?Systemic lupus erythematosus (SLE) ?Tobacco smoker ?An unusual or allergic reaction to estrogens, other hormones, medications, foods, dyes, or preservatives ?Pregnant or trying to get pregnant ?Breast-feeding ?How should I use this medication? ?Take this medication by mouth. To reduce nausea, this medication may be taken with food. Follow the directions on the prescription label. Take this medication at the same time each day and in the order directed on the package. Do not take your medication more often than directed. ?Contact your care team regarding the use of this medication in children. Special care may be needed. ?A patient package insert for the product will be given with each prescription and refill. Read this sheet carefully each time. The  sheet may change frequently. ?Overdosage: If you think you have taken too much of this medicine contact a poison control center or emergency room at once. ?NOTE: This medicine is only for you. Do not share this medicine with others. ?What if I miss a dose? ?If you miss a dose, take it as soon as you can. If it is almost time for your next dose, take only that dose. Do not take double or extra doses. ?What may interact with this medication? ?Do not take this medication with any of the following: ?Aromatase inhibitors like aminoglutethimide, anastrozole, exemestane, letrozole, testolactone ?This medication may also interact with the following: ?Carbamazepine ?Certain antibiotics used to treat infections ?Certain barbiturates or benzodiazepines used for inducing sleep or treating seizures ?Grapefruit juice ?Medications for fungus infections like itraconazole and ketoconazole ?Raloxifene or tamoxifen ?Rifabutin, rifampin, or rifapentine ?Ritonavir ?[place] ?Warfarin ?This list may not describe all possible interactions. Give your health care provider a list of all the medicines, herbs, non-prescription drugs, or dietary supplements you use. Also tell them if you smoke, drink alcohol, or use illegal drugs. Some items may interact with your medicine. ?What should I watch for while using this medication? ?Visit your care team for regular checks on your progress. You will need a regular breast and pelvic exam and Pap smear while on this medication. You should also discuss the need for regular mammograms with your care team, and follow their guidelines for these tests. ?This medication can make your body retain fluid, making your fingers, hands, or ankles swell. Your blood pressure can go up. Contact your care team if you feel you are retaining fluid. ?If you have any reason to think you are pregnant, stop  taking this medication right away and contact your care team. ?Smoking increases the risk of getting a blood  clot or having a stroke while you are taking this medication, especially if you are more than 42 years old. You are strongly advised not to smoke. ?If you wear contact lenses and notice visual changes, or if the lenses begin to feel uncomfortable, consult your eye care specialists. ?This medication can increase the risk of developing a condition (endometrial hyperplasia) that may lead to cancer of the lining of the uterus. Taking progestins, another hormone medication, with this medication lowers the risk of developing this condition. Therefore, if your uterus has not been removed (by a hysterectomy), your care team may prescribe a progestin for you to take together with your estrogen. You should know, however, that taking estrogens with progestins may have additional health risks. You should discuss the use of estrogens and progestins with your care team to determine the benefits and risks for you. ?If you are going to have surgery, you may need to stop taking this medication. Consult your care team for advice before you schedule the surgery. ?What side effects may I notice from receiving this medication? ?Side effects that you should report to your care team as soon as possible: ?Allergic reactions--skin rash, itching, hives, swelling of the face, lips, tongue, or throat ?Blood clot--pain, swelling, or warmth in the leg, shortness of breath, chest pain ?Breast tissue changes, new lumps, redness, pain, or discharge from the nipple ?Gallbladder problems--severe stomach pain, nausea, vomiting, fever ?Increase in blood pressure ?Liver injury--right upper belly pain, loss of appetite, nausea, light-colored stool, dark yellow or brown urine, yellowing skin or eyes, unusual weakness or fatigue ?Stroke--sudden numbness or weakness of the face, arm, or leg, trouble speaking, confusion, trouble walking, loss of balance or coordination, dizziness, severe headache, change in vision ?Unusual vaginal discharge, itching, or  odor ?Vaginal bleeding after menopause, pelvic pain ?Side effects that usually do not require medical attention (report to your care team if they continue or are bothersome): ?Bloating ?Breast pain or tenderness ?Hair loss ?Nausea ?Stomach pain ?Vomiting ?This list may not describe all possible side effects. Call your doctor for medical advice about side effects. You may report side effects to FDA at 1-800-FDA-1088. ?Where should I keep my medication? ?Keep out of the reach of children and pets. ?Store at room temperature between 20 and 25 degrees C (68 and 77 degrees F). Keep container tightly closed. Protect from light. Throw away any unused medication after the expiration date. ?NOTE: This sheet is a summary. It may not cover all possible information. If you have questions about this medicine, talk to your doctor, pharmacist, or health care provider. ?? 2022 Elsevier/Gold Standard (2020-10-14 00:00:00) ? ?

## 2021-12-15 ENCOUNTER — Other Ambulatory Visit: Payer: Self-pay

## 2021-12-15 ENCOUNTER — Ambulatory Visit
Admission: RE | Admit: 2021-12-15 | Discharge: 2021-12-15 | Disposition: A | Discharge status: Home / Self Care | Payer: Medicare Other | Source: Ambulatory Visit | Attending: Obstetrics | Admitting: Obstetrics

## 2021-12-15 DIAGNOSIS — Z1231 Encounter for screening mammogram for malignant neoplasm of breast: Secondary | ICD-10-CM | POA: Diagnosis present

## 2021-12-18 ENCOUNTER — Other Ambulatory Visit: Payer: Self-pay | Admitting: Obstetrics

## 2021-12-18 DIAGNOSIS — R928 Other abnormal and inconclusive findings on diagnostic imaging of breast: Secondary | ICD-10-CM

## 2021-12-18 DIAGNOSIS — N63 Unspecified lump in unspecified breast: Secondary | ICD-10-CM

## 2021-12-25 ENCOUNTER — Other Ambulatory Visit: Payer: Self-pay

## 2021-12-25 ENCOUNTER — Ambulatory Visit
Admission: RE | Admit: 2021-12-25 | Discharge: 2021-12-25 | Disposition: A | Discharge status: Home / Self Care | Payer: Medicare Other | Source: Ambulatory Visit | Attending: Obstetrics | Admitting: Obstetrics

## 2021-12-25 DIAGNOSIS — R928 Other abnormal and inconclusive findings on diagnostic imaging of breast: Secondary | ICD-10-CM | POA: Insufficient documentation

## 2021-12-25 DIAGNOSIS — N63 Unspecified lump in unspecified breast: Secondary | ICD-10-CM | POA: Diagnosis present

## 2021-12-26 ENCOUNTER — Other Ambulatory Visit: Payer: Self-pay | Admitting: Obstetrics

## 2021-12-26 DIAGNOSIS — N63 Unspecified lump in unspecified breast: Secondary | ICD-10-CM

## 2021-12-26 DIAGNOSIS — R928 Other abnormal and inconclusive findings on diagnostic imaging of breast: Secondary | ICD-10-CM

## 2022-01-01 ENCOUNTER — Encounter: Payer: Self-pay | Admitting: Obstetrics & Gynecology

## 2022-01-01 ENCOUNTER — Ambulatory Visit (INDEPENDENT_AMBULATORY_CARE_PROVIDER_SITE_OTHER): Payer: Medicare Other | Admitting: Obstetrics & Gynecology

## 2022-01-01 DIAGNOSIS — Z780 Asymptomatic menopausal state: Secondary | ICD-10-CM | POA: Diagnosis not present

## 2022-01-01 MED ORDER — ESTRADIOL 2 MG PO TABS: 2.0000 mg | ORAL_TABLET | Freq: Every day | ORAL | 3 refills | Status: DC

## 2022-01-01 NOTE — Progress Notes (Signed)
Virtual Visit via Telephone Note ? ?I connected with Ernie Hew on 01/01/22 at  3:15 PM EDT by telephone and verified that I am speaking with the correct person using two identifiers. ? ?Location: ?Patient: Rachel Wall, parked, safe ?Provider: Office ?  ?I discussed the limitations, risks, security and privacy concerns of performing an evaluation and management service by telephone and the availability of in person appointments. I also discussed with the patient that there may be a patient responsible charge related to this service. The patient expressed understanding and agreed to proceed. ? ? ?History of Present Illness: ?  SHARMAN GARROTT is a 41 y.o. who was started on  ?Current Meds  ?Medication Sig  ? estradiol (ESTRACE) 2 MG tablet Take 1 tablet (2 mg total) by mouth daily.  ? approximately 4 weeks ago. Since that time, she states that her symptoms are improving, but still not all the way better.  No neg side effects. ? ?PMHx: ?She  has a past medical history of Anxiety, Arthritis, Bradycardia, CHF (congestive heart failure) ([place]), Depression, Dyspnea, Dysrhythmia, Family history of breast cancer, Family history of ovarian cancer, GERD (gastroesophageal reflux disease), Headache, History of kidney stones, Hypertension, On home oxygen therapy, Presence of permanent cardiac pacemaker, Pulmonary hypertension ([place]), and Tachycardia. Also,  has a past surgical history that includes Tubal ligation (10/10/2011); Pacemaker insertion (2013); Stent removal; Lithotripsy (2012); Insert / replace / remove pacemaker; heart monitor implant and removal; Total laparoscopic hysterectomy with salpingectomy (Bilateral, 05/12/2019); Cystoscopy (05/12/2019); Colonoscopy with propofol (N/A, 08/04/2020); Colonoscopy with propofol (N/A, 08/11/2020); Cystoscopy; Abdominal hysterectomy; Colonoscopy with propofol (N/A, 10/20/2020); Endoscopic mucosal resection (N/A, 10/20/2020); Esophagogastroduodenoscopy (egd) with propofol (N/A, 10/20/2020);  biopsy (10/20/2020); polypectomy (10/20/2020); Hot hemostasis (N/A, 10/20/2020); Hemostasis clip placement (10/20/2020); and Esophagogastroduodenoscopy (egd) with propofol (N/A, 02/07/2021)., family history includes Brain cancer in her paternal uncle; Breast cancer in her paternal aunt; Colon cancer (age of onset: 41) in her sister; Colon cancer (age of onset: 33) in her paternal grandfather; Diabetes in her father; Healthy in her mother; Heart disease in her father; Hypertension in her father; Kidney cancer (age of onset: 25) in her father; Lung disease in her father; Ovarian cancer in her paternal aunt; Stomach cancer in her paternal aunt.,  reports that she quit smoking about 18 months ago. Her smoking use included cigarettes. She smoked an average of .5 packs per day. She has never used smokeless tobacco. She reports that she does not currently use alcohol. She reports that she does not currently use drugs. ?Current Meds  ?Medication Sig  ? estradiol (ESTRACE) 2 MG tablet Take 1 tablet (2 mg total) by mouth daily.  ?. Also, is allergic to cyclosporine, nitroglycerin, other, tizanidine, adhesive [tape], and tapentadol.. ? ?ROS ? ? ?Observations/Objective: ?No exam today, due to telephone eVisit due to [facility] virus restriction on elective visits and procedures.  Prior visits reviewed along with ultrasounds/labs as indicated. ? ?Assessment and Plan: ?  ICD-10-CM   ?1. Menopause  Z78.0 estradiol (ESTRACE) 2 MG tablet  ?  ? ?Medication treatment is going well for her management of menopausal type symptoms.  Still has room for improvement.  Counseled as to some sx's take a while to improve, or may require additional measures (libido, for example). ? ?Plan: ?She will undergo dosage change up for Estradiol in her medical therapy. ? ?She was amenable to this plan and we will see her back for annual/PRN. ? ?Follow Up Instructions: ?As needed ?  ?I discussed the assessment and treatment  plan with the patient. The patient was  provided an opportunity to ask questions and all were answered. The patient agreed with the plan and demonstrated an understanding of the instructions. ?  ?The patient was advised to call back or seek an in-person evaluation if the symptoms worsen or if the condition fails to improve as anticipated. ? ?I provided 15 minutes of non-face-to-face time during this encounter. ? ? ?Hoyt Koch, MD ? ? ?

## 2022-01-12 ENCOUNTER — Other Ambulatory Visit: Payer: Self-pay | Admitting: Obstetrics

## 2022-01-12 ENCOUNTER — Ambulatory Visit
Admission: RE | Admit: 2022-01-12 | Discharge: 2022-01-12 | Disposition: A | Discharge status: Home / Self Care | Payer: Medicare Other | Source: Ambulatory Visit | Attending: Obstetrics | Admitting: Obstetrics

## 2022-01-12 DIAGNOSIS — R928 Other abnormal and inconclusive findings on diagnostic imaging of breast: Secondary | ICD-10-CM

## 2022-01-12 DIAGNOSIS — N63 Unspecified lump in unspecified breast: Secondary | ICD-10-CM

## 2022-01-16 LAB — SURGICAL PATHOLOGY

## 2022-07-09 ENCOUNTER — Ambulatory Visit (INDEPENDENT_AMBULATORY_CARE_PROVIDER_SITE_OTHER): Payer: Medicare Other | Admitting: Obstetrics and Gynecology

## 2022-07-09 ENCOUNTER — Encounter: Payer: Self-pay | Admitting: Obstetrics and Gynecology

## 2022-07-09 VITALS — BP 90/64 | Ht 68.5 in | Wt 201.0 lb

## 2022-07-09 DIAGNOSIS — N760 Acute vaginitis: Secondary | ICD-10-CM | POA: Diagnosis not present

## 2022-07-09 DIAGNOSIS — B9689 Other specified bacterial agents as the cause of diseases classified elsewhere: Secondary | ICD-10-CM

## 2022-07-09 DIAGNOSIS — N3001 Acute cystitis with hematuria: Secondary | ICD-10-CM | POA: Diagnosis not present

## 2022-07-09 LAB — POCT URINALYSIS DIPSTICK
Bilirubin, UA: NEGATIVE
Glucose, UA: NEGATIVE
Ketones, UA: NEGATIVE
Leukocytes, UA: NEGATIVE
Nitrite, UA: POSITIVE
Protein, UA: NEGATIVE
Spec Grav, UA: 1.025 (ref 1.010–1.025)
pH, UA: 6 (ref 5.0–8.0)

## 2022-07-09 LAB — POCT WET PREP WITH KOH
Clue Cells Wet Prep HPF POC: POSITIVE
KOH Prep POC: POSITIVE — AB
Trichomonas, UA: NEGATIVE
Yeast Wet Prep HPF POC: NEGATIVE

## 2022-07-09 MED ORDER — FLUCONAZOLE 150 MG PO TABS: 150.0000 mg | ORAL_TABLET | Freq: Once | ORAL | 0 refills | Status: AC

## 2022-07-09 MED ORDER — CEPHALEXIN 500 MG PO CAPS: 500.0000 mg | ORAL_CAPSULE | Freq: Two times a day (BID) | ORAL | 0 refills | Status: AC

## 2022-07-09 MED ORDER — METRONIDAZOLE 500 MG PO TABS: ORAL_TABLET | ORAL | 0 refills | Status: DC

## 2022-07-09 NOTE — Patient Instructions (Signed)
I value your feedback and you entrusting us with your care. If you get a [facility] patient survey, I would appreciate you taking the time to let us know about your experience today. Thank you! ? ? ?

## 2022-07-09 NOTE — Progress Notes (Signed)
System, Provider Not In   Chief Complaint  Patient presents with   Urinary Tract Infection    Urinary pressure/some pain x 1 week   Vaginal Discharge    Sour odor, no itching x 1 week    HPI:      Rachel Wall is a 41 y.o. [id number] whose LMP was Patient's last menstrual period was 04/14/2019 (exact date)., presents today for UTI sx of hematuria, dysuria, pelvic pressure and pain with voiding, LBP; no frequency/urgency or fevers. Hx of kidney stones and UTIs. Doesn't void frequently due to work. S/p hyst.  Also has increased vag d/c and odor, no itching/irritation. Hx of BV. No meds to treat. Sex seems to be trigger. Hasn't tried boric acid supp. Pt with decreased libido, mood changes, vaginal dryness/decreased lubrication. Pt on estradiol 2 mg dose. Pt on celexa and has been off her ADD meds and xanax. Hasn't had f/u with PCP recently.    Patient Active Problem List   Diagnosis Date Noted   Menopause 11/29/2021   Morbid obesity with BMI of 40.0-44.9, adult ([place]) 10/17/2021   Esophagitis determined by endoscopy 12/30/2020   Genetic testing 12/19/2020   Family history of breast cancer 10/31/2020   Family history of ovarian cancer 10/31/2020   Family history of uterine cancer 10/31/2020   Adenomatous polyp of transverse colon 10/12/2020   Family history of colon cancer 10/12/2020   Bloating 10/12/2020   Pyrosis 10/12/2020   Family history of celiac disease 10/12/2020   History of colonic polyps 07/13/2020   Fibroid uterus 05/12/2019   Pulmonary arterial hypertension ([place]) 05/12/2019   Status post laparoscopic hysterectomy 05/12/2019   Menorrhagia with irregular cycle 12/19/2018   Tobacco use 11/20/2018    Past Surgical History:  Procedure Laterality Date   ABDOMINAL HYSTERECTOMY     BIOPSY  10/20/2020   Procedure: BIOPSY;  Surgeon: Irving Copas., MD;  Location: [place];  Service: Gastroenterology;;   COLONOSCOPY WITH PROPOFOL N/A 08/04/2020    Procedure: COLONOSCOPY WITH PROPOFOL;  Surgeon: Jonathon Bellows, MD;  Location: [facility] ENDOSCOPY;  Service: Gastroenterology;  Laterality: N/A;   COLONOSCOPY WITH PROPOFOL N/A 08/11/2020   Procedure: COLONOSCOPY WITH PROPOFOL;  Surgeon: Jonathon Bellows, MD;  Location: [facility] ENDOSCOPY;  Service: Gastroenterology;  Laterality: N/A;   COLONOSCOPY WITH PROPOFOL N/A 10/20/2020   Procedure: COLONOSCOPY WITH PROPOFOL;  Surgeon: Rush Landmark Telford Nab., MD;  Location: [place];  Service: Gastroenterology;  Laterality: N/A;   CYSTOSCOPY  05/12/2019   Procedure: CYSTOSCOPY;  Surgeon: Will Bonnet, MD;  Location: [facility];  Service: Gynecology;;   CYSTOSCOPY     ENDOSCOPIC MUCOSAL RESECTION N/A 10/20/2020   Procedure: ENDOSCOPIC MUCOSAL RESECTION;  Surgeon: Irving Copas., MD;  Location: [place];  Service: Gastroenterology;  Laterality: N/A;   ESOPHAGOGASTRODUODENOSCOPY (EGD) WITH PROPOFOL N/A 10/20/2020   Procedure: ESOPHAGOGASTRODUODENOSCOPY (EGD) WITH PROPOFOL;  Surgeon: Rush Landmark Telford Nab., MD;  Location: [place];  Service: Gastroenterology;  Laterality: N/A;   ESOPHAGOGASTRODUODENOSCOPY (EGD) WITH PROPOFOL N/A 02/07/2021   Procedure: ESOPHAGOGASTRODUODENOSCOPY (EGD) WITH PROPOFOL;  Surgeon: Jonathon Bellows, MD;  Location: [facility] ENDOSCOPY;  Service: Gastroenterology;  Laterality: N/A;   heart monitor implant and removal     HEMOSTASIS CLIP PLACEMENT  10/20/2020   Procedure: HEMOSTASIS CLIP PLACEMENT;  Surgeon: Irving Copas., MD;  Location: [place];  Service: Gastroenterology;;   HOT HEMOSTASIS N/A 10/20/2020   Procedure: HOT HEMOSTASIS (ARGON PLASMA COAGULATION/BICAP);  Surgeon: Irving Copas., MD;  Location: [place];  Service: Gastroenterology;  Laterality: N/A;   INSERT / REPLACE / REMOVE PACEMAKER     LITHOTRIPSY  2012   PACEMAKER INSERTION  2013   POLYPECTOMY  10/20/2020   Procedure: POLYPECTOMY;  Surgeon: Rush Landmark Telford Nab., MD;  Location: [place];   Service: Gastroenterology;;   Lavell Islam REMOVAL     TOTAL LAPAROSCOPIC HYSTERECTOMY WITH SALPINGECTOMY Bilateral 05/12/2019   Procedure: TOTAL LAPAROSCOPIC HYSTERECTOMY;  Surgeon: Will Bonnet, MD;  Location: [facility];  Service: Gynecology;  Laterality: Bilateral;   TUBAL LIGATION  10/10/2011    Family History  Problem Relation Age of Onset   Healthy Mother    Lung disease Father    Heart disease Father    Diabetes Father    Hypertension Father    Kidney cancer Father 29   Colon cancer Paternal Grandfather 17   Colon cancer Sister 104   Breast cancer Paternal Aunt    Ovarian cancer Paternal Aunt    Stomach cancer Paternal Aunt    Brain cancer Paternal Uncle    Inflammatory bowel disease Neg Hx    Liver disease Neg Hx    Pancreatic cancer Neg Hx     Social History   Socioeconomic History   Marital status: Married    Spouse name: Not on file   Number of children: Not on file   Years of education: Not on file   Highest education level: Not on file  Occupational History   Not on file  Tobacco Use   Smoking status: Former    Packs/day: 0.50    Types: Cigarettes    Quit date: 07/2020    Years since quitting: 2.0   Smokeless tobacco: Never  Vaping Use   Vaping Use: Every day   Substances: Nicotine  Substance and Sexual Activity   Alcohol use: Not Currently   Drug use: Not Currently   Sexual activity: Yes    Birth control/protection: Surgical    Comment: Tubal ligation/Hysterectomy  Other Topics Concern   Not on file  Social History Narrative   Not on file   Social Determinants of Health   Financial Resource Strain: Not on file  Food Insecurity: Not on file  Transportation Needs: Not on file  Physical Activity: Not on file  Stress: Not on file  Social Connections: Not on file  Intimate Partner Violence: Not on file    Outpatient Medications Prior to Visit  Medication Sig Dispense Refill   albuterol (VENTOLIN HFA) 108 (90 Base) MCG/ACT inhaler Inhale 2  puffs into the lungs every 6 (six) hours as needed for wheezing or shortness of breath.      ambrisentan (LETAIRIS) 10 MG tablet Take 10 mg by mouth daily.      Azelastine HCl 137 MCG/SPRAY SOLN Place into both nostrils.     citalopram (CELEXA) 40 MG tablet Take 40 mg by mouth daily.     diltiazem (CARDIZEM) 120 MG tablet Take 120-240 mg by mouth See admin instructions. Take 120 mg by mouth in the morning and take 240 mg by mouth at bedtime     estradiol (ESTRACE) 2 MG tablet Take 1 tablet (2 mg total) by mouth daily. 90 tablet 3   famotidine (PEPCID) 20 MG tablet Take 80 mg by mouth 2 (two) times daily.     FLOVENT HFA 110 MCG/ACT inhaler Inhale 2 puffs into the lungs 2 (two) times daily.     furosemide (LASIX) 40 MG tablet Take 40 mg by mouth 2 (two) times daily.     potassium  chloride SA (KLOR-CON M) 20 MEQ tablet Take 20 mEq by mouth daily.     Selexipag 1200 MCG TABS Take 1,200 mcg by mouth 2 (two) times daily.      spironolactone (ALDACTONE) 50 MG tablet Take 50 mg by mouth daily.     tadalafil, PAH, (ADCIRCA) 20 MG tablet Take 40 mg by mouth daily.     traZODone (DESYREL) 50 MG tablet TAKE 3 TABLETS(150 MG) BY MOUTH AT BEDTIME AS NEEDED FOR SLEEP 90 tablet 0   APO-VARENICLINE 0.5 MG tablet Take 0.5 mg by mouth daily.     furosemide (LASIX) 20 MG tablet Take 40 mg by mouth daily.     pantoprazole (PROTONIX) 40 MG tablet Take 1 tablet (40 mg total) by mouth 2 (two) times daily before a meal. 60 tablet 6   No facility-administered medications prior to visit.      ROS:  Review of Systems  Constitutional:  Negative for fever.  Gastrointestinal:  Negative for blood in stool, constipation, diarrhea, nausea and vomiting.  Genitourinary:  Positive for dysuria, frequency, hematuria and urgency. Negative for dyspareunia, flank pain, vaginal bleeding, vaginal discharge and vaginal pain.  Musculoskeletal:  Negative for back pain.  Skin:  Negative for rash.   BREAST: No  symptoms   OBJECTIVE:   Vitals:  BP 90/64   Ht 5' 8.5" (1.74 m)   Wt 201 lb (91.2 kg)   LMP 04/14/2019 (Exact Date)   BMI 30.12 kg/m   Physical Exam Vitals reviewed.  Constitutional:      Appearance: She is well-developed. She is not ill-appearing or toxic-appearing.  Pulmonary:     Effort: Pulmonary effort is normal.  Abdominal:     Palpations: Abdomen is soft.     Tenderness: There is left CVA tenderness. There is no right CVA tenderness.  Genitourinary:    General: Normal vulva.     Pubic Area: No rash.      Labia:        Right: No rash, tenderness or lesion.        Left: No rash, tenderness or lesion.      Vagina: Vaginal discharge present. No erythema, tenderness or bleeding.     Uterus: Not tender.      Adnexa: Right adnexa normal and left adnexa normal.       Right: No mass or tenderness.         Left: No mass or tenderness.    Musculoskeletal:        General: Normal range of motion.     Cervical back: Normal range of motion.  Skin:    General: Skin is warm and dry.  Neurological:     General: No focal deficit present.     Mental Status: She is alert and oriented to person, place, and time.     Cranial Nerves: No cranial nerve deficit.  Psychiatric:        Mood and Affect: Mood normal.        Behavior: Behavior normal.        Thought Content: Thought content normal.        Judgment: Judgment normal.     Results: Results for orders placed or performed in visit on 07/09/22 (from the past 24 hour(s))  POCT Urinalysis Dipstick     Status: Abnormal   Collection Time: 07/09/22  5:13 PM  Result Value Ref Range   Color, UA Mikhaila    Clarity, UA clear    Glucose, UA Negative Negative  Bilirubin, UA neg    Ketones, UA neg    Spec Grav, UA 1.025 1.010 - 1.025   Blood, UA large    pH, UA 6.0 5.0 - 8.0   Protein, UA Negative Negative   Urobilinogen, UA     Nitrite, UA pos    Leukocytes, UA Negative Negative   Appearance     Odor    POCT Wet Prep with KOH      Status: Abnormal   Collection Time: 07/09/22  5:14 PM  Result Value Ref Range   Trichomonas, UA Negative    Clue Cells Wet Prep HPF POC pos    Epithelial Wet Prep HPF POC     Yeast Wet Prep HPF POC neg    Bacteria Wet Prep HPF POC     RBC Wet Prep HPF POC     WBC Wet Prep HPF POC     KOH Prep POC Positive (A) Negative     Assessment/Plan: Acute cystitis with hematuria - Plan: Urine Culture, POCT Urinalysis Dipstick, cephALEXin (KEFLEX) 500 MG capsule; pos sx and UA. Rx keflex. Check C&S. F/u prn.   BV (bacterial vaginosis) - Plan: POCT Wet Prep with KOH, metroNIDAZOLE (FLAGYL) 500 MG tablet, fluconazole (DIFLUCAN) 150 MG tablet; pos sx and wet prep. Rx flagyl, no EtoH. Rx diflucan prn yeast vag after abx use.   Mood changes--pt to f/u with PCP. Not HRT dose dependent.   Meds ordered this encounter  Medications   metroNIDAZOLE (FLAGYL) 500 MG tablet    Sig: Take 1 tab BID for 7 days; NO alcohol use for 10 days after prescription start    Dispense:  14 tablet    Refill:  0    Order Specific Question:   Supervising Provider    Answer:   Rubie Maid [[id number]]   fluconazole (DIFLUCAN) 150 MG tablet    Sig: Take 1 tablet (150 mg total) by mouth once for 1 dose.    Dispense:  1 tablet    Refill:  0    Order Specific Question:   Supervising Provider    Answer:   Rubie Maid [[id number]]   cephALEXin (KEFLEX) 500 MG capsule    Sig: Take 1 capsule (500 mg total) by mouth 2 (two) times daily for 7 days.    Dispense:  14 capsule    Refill:  0    Order Specific Question:   Supervising Provider    Answer:   Rubie Maid [[id number]]      Return if symptoms worsen or fail to improve.  Rachel Shankland B. Tayloranne Lekas, PA-C 07/09/2022 5:16 PM

## 2022-07-13 LAB — URINE CULTURE

## 2022-12-31 ENCOUNTER — Other Ambulatory Visit: Payer: Self-pay

## 2022-12-31 DIAGNOSIS — Z780 Asymptomatic menopausal state: Secondary | ICD-10-CM

## 2022-12-31 NOTE — Telephone Encounter (Signed)
Left VM with contact information for scheduling. Mychart msg sent.   Thanks

## 2022-12-31 NOTE — Telephone Encounter (Signed)
Pt needs annual or can have PCP RF estradiol. Thx.

## 2022-12-31 NOTE — Telephone Encounter (Signed)
[facility] pharmacy faxed refill request for Estradiol this was last filled 01/01/22 and last office visit 07/09/22. Please review. KW

## 2023-01-01 MED ORDER — ESTRADIOL 2 MG PO TABS: 2.0000 mg | ORAL_TABLET | Freq: Every day | ORAL | 0 refills | Status: DC

## 2023-01-01 NOTE — Telephone Encounter (Signed)
The patient is scheduled for 4/30 with Dr. Hulan Fray

## 2023-01-29 ENCOUNTER — Ambulatory Visit (INDEPENDENT_AMBULATORY_CARE_PROVIDER_SITE_OTHER): Payer: Medicare HMO | Admitting: Obstetrics & Gynecology

## 2023-01-29 ENCOUNTER — Encounter: Payer: Self-pay | Admitting: Obstetrics & Gynecology

## 2023-01-29 VITALS — BP 127/83 | HR 89 | Ht 68.0 in | Wt 166.0 lb

## 2023-01-29 DIAGNOSIS — E8941 Symptomatic postprocedural ovarian failure: Secondary | ICD-10-CM | POA: Insufficient documentation

## 2023-01-29 DIAGNOSIS — Z01419 Encounter for gynecological examination (general) (routine) without abnormal findings: Secondary | ICD-10-CM

## 2023-01-29 DIAGNOSIS — Z780 Asymptomatic menopausal state: Secondary | ICD-10-CM

## 2023-01-29 MED ORDER — ESTRADIOL 2 MG PO TABS: 2.0000 mg | ORAL_TABLET | Freq: Every day | ORAL | 5 refills | Status: DC

## 2023-01-29 NOTE — Progress Notes (Signed)
GYNECOLOGY ANNUAL PHYSICAL EXAM PROGRESS NOTE  Subjective:    Rachel Wall is a 42 y.o. married  [id number] (11, 88, and 3 yo kids) here for a routine annual gynecologic exam. The patient is rarely sexually active. The patient is taking estrogen replacement therapy. Patient denies post-menopausal vaginal bleeding. The patient wears seatbelts: yes. The patient participates in regular exercise: yes. Has the patient ever been transfused or tattooed?: yes. The patient reports that there is not domestic violence in her life.  Current complaints: 1.  She is still experiencing lots of hot flashes. She takes 2 mg of estrogen and also takes celexa. She has tried OTC supplements without help.   2. She is requesting a referral to psychiatry so that she can have her xanax refilled.   Gynecologic History No LMP recorded. (Menstrual status: Perimenopausal).  Last Pap: 2021. Results were: normal Last mammogram: 2023. Results were: normal Last Colonoscopy: UTD  FH- sister with colon, vulvar and cervical cancer, paternal aunt with breast cancer  ROS- she had genetic testing and does not have any known abnormalities.   Obstetric History OB History  Gravida Para Term Preterm AB Living  4 2 2  0 2 2  SAB IAB Ectopic Multiple Live Births  1 0 1 0 2     Menstrual History:  Patient's last menstrual period was 04/14/2019 (exact date).     Gynecologic History:  She had a hysterectomy.       OB History  Gravida Para Term Preterm AB Living  5 3 3  0 2 3  SAB IAB Ectopic Multiple Live Births  2 0 0 0 3    # Outcome Date GA Lbr Len/2nd Weight Sex Delivery Anes PTL Lv  5 SAB           4 SAB           3 Term           2 Term           1 Term             Past Medical History:  Diagnosis Date   Anxiety    Arthritis    Bradycardia    has pacemaker   CHF (congestive heart failure) (HCC)    Depression    Dyspnea    Dysrhythmia    Family history of breast cancer    neg cancer  genetic testing per pt (no results in chart)   Family history of ovarian cancer    GERD (gastroesophageal reflux disease)    Headache    Migraines   History of kidney stones    Hypertension    On home oxygen therapy    4 l at night- Oxygen sat - 80's   Presence of permanent cardiac pacemaker    Pulmonary hypertension (HCC)    Tachycardia     Past Surgical History:  Procedure Laterality Date   ABDOMINAL HYSTERECTOMY     BIOPSY  10/20/2020   Procedure: BIOPSY;  Surgeon: Lemar Lofty., MD;  Location: [facility] ENDOSCOPY;  Service: Gastroenterology;;   COLONOSCOPY WITH PROPOFOL N/A 08/04/2020   Procedure: COLONOSCOPY WITH PROPOFOL;  Surgeon: Wyline Mood, MD;  Location: [facility] ENDOSCOPY;  Service: Gastroenterology;  Laterality: N/A;   COLONOSCOPY WITH PROPOFOL N/A 08/11/2020   Procedure: COLONOSCOPY WITH PROPOFOL;  Surgeon: Wyline Mood, MD;  Location: [facility] ENDOSCOPY;  Service: Gastroenterology;  Laterality: N/A;   COLONOSCOPY WITH PROPOFOL N/A 10/20/2020   Procedure: COLONOSCOPY WITH PROPOFOL;  Surgeon:  Mansouraty, Netty Starring., MD;  Location: [facility] ENDOSCOPY;  Service: Gastroenterology;  Laterality: N/A;   CYSTOSCOPY  05/12/2019   Procedure: CYSTOSCOPY;  Surgeon: Conard Novak, MD;  Location: [facility];  Service: Gynecology;;   CYSTOSCOPY     ENDOSCOPIC MUCOSAL RESECTION N/A 10/20/2020   Procedure: ENDOSCOPIC MUCOSAL RESECTION;  Surgeon: Lemar Lofty., MD;  Location: [facility] ENDOSCOPY;  Service: Gastroenterology;  Laterality: N/A;   ESOPHAGOGASTRODUODENOSCOPY (EGD) WITH PROPOFOL N/A 10/20/2020   Procedure: ESOPHAGOGASTRODUODENOSCOPY (EGD) WITH PROPOFOL;  Surgeon: Meridee Score Netty Starring., MD;  Location: [facility] ENDOSCOPY;  Service: Gastroenterology;  Laterality: N/A;   ESOPHAGOGASTRODUODENOSCOPY (EGD) WITH PROPOFOL N/A 02/07/2021   Procedure: ESOPHAGOGASTRODUODENOSCOPY (EGD) WITH PROPOFOL;  Surgeon: Wyline Mood, MD;  Location: [facility] ENDOSCOPY;  Service: Gastroenterology;  Laterality: N/A;    GASTRIC BYPASS     heart monitor implant and removal     HEMOSTASIS CLIP PLACEMENT  10/20/2020   Procedure: HEMOSTASIS CLIP PLACEMENT;  Surgeon: Lemar Lofty., MD;  Location: [facility] ENDOSCOPY;  Service: Gastroenterology;;   HOT HEMOSTASIS N/A 10/20/2020   Procedure: HOT HEMOSTASIS (ARGON PLASMA COAGULATION/BICAP);  Surgeon: Lemar Lofty., MD;  Location: [facility] ENDOSCOPY;  Service: Gastroenterology;  Laterality: N/A;   INSERT / REPLACE / REMOVE PACEMAKER     LITHOTRIPSY  2012   PACEMAKER INSERTION  2013   POLYPECTOMY  10/20/2020   Procedure: POLYPECTOMY;  Surgeon: Meridee Score Netty Starring., MD;  Location: [facility] ENDOSCOPY;  Service: Gastroenterology;;   Francine Graven REMOVAL     TOTAL LAPAROSCOPIC HYSTERECTOMY WITH SALPINGECTOMY Bilateral 05/12/2019   Procedure: TOTAL LAPAROSCOPIC HYSTERECTOMY;  Surgeon: Conard Novak, MD;  Location: [facility];  Service: Gynecology;  Laterality: Bilateral;   TUBAL LIGATION  10/10/2011    Family History  Problem Relation Age of Onset   Healthy Mother    Lung disease Father    Heart disease Father    Diabetes Father    Hypertension Father    Kidney cancer Father 51   Colon cancer Paternal Grandfather 84   Colon cancer Sister 22   Breast cancer Paternal Aunt    Ovarian cancer Paternal Aunt    Stomach cancer Paternal Aunt    Brain cancer Paternal Uncle    Inflammatory bowel disease Neg Hx    Liver disease Neg Hx    Pancreatic cancer Neg Hx     Social History   Socioeconomic History   Marital status: Married    Spouse name: Not on file   Number of children: Not on file   Years of education: Not on file   Highest education level: Not on file  Occupational History   Not on file  Tobacco Use   Smoking status: Former    Packs/day: .5    Types: Cigarettes    Quit date: 07/2020    Years since quitting: 2.5   Smokeless tobacco: Never  Vaping Use   Vaping Use: Every day   Substances: Nicotine  Substance and Sexual Activity   Alcohol  use: Not Currently   Drug use: Not Currently   Sexual activity: Yes    Birth control/protection: Surgical    Comment: Tubal ligation/Hysterectomy  Other Topics Concern   Not on file  Social History Narrative   Not on file   Social Determinants of Health   Financial Resource Strain: Not on file  Food Insecurity: Not on file  Transportation Needs: Not on file  Physical Activity: Not on file  Stress: Not on file  Social Connections: Not on file  Intimate Partner Violence: Not  on file    Current Outpatient Medications on File Prior to Visit  Medication Sig Dispense Refill   albuterol (VENTOLIN HFA) 108 (90 Base) MCG/ACT inhaler Inhale 2 puffs into the lungs every 6 (six) hours as needed for wheezing or shortness of breath.      ambrisentan (LETAIRIS) 10 MG tablet Take 10 mg by mouth daily.      Azelastine HCl 137 MCG/SPRAY SOLN Place into both nostrils.     citalopram (CELEXA) 40 MG tablet Take 40 mg by mouth daily.     diltiazem (CARDIZEM) 120 MG tablet Take 120-240 mg by mouth See admin instructions. Take 120 mg by mouth in the morning and take 240 mg by mouth at bedtime     estradiol (ESTRACE) 2 MG tablet Take 1 tablet (2 mg total) by mouth daily. 90 tablet 0   famotidine (PEPCID) 20 MG tablet Take 80 mg by mouth 2 (two) times daily.     FLOVENT HFA 110 MCG/ACT inhaler Inhale 2 puffs into the lungs 2 (two) times daily.     furosemide (LASIX) 40 MG tablet Take 40 mg by mouth 2 (two) times daily.     potassium chloride SA (KLOR-CON M) 20 MEQ tablet Take 20 mEq by mouth daily.     Selexipag 1200 MCG TABS Take 1,200 mcg by mouth 2 (two) times daily.      spironolactone (ALDACTONE) 50 MG tablet Take 50 mg by mouth daily.     tadalafil, PAH, (ADCIRCA) 20 MG tablet Take 40 mg by mouth daily.     traZODone (DESYREL) 50 MG tablet TAKE 3 TABLETS(150 MG) BY MOUTH AT BEDTIME AS NEEDED FOR SLEEP 90 tablet 0   metroNIDAZOLE (FLAGYL) 500 MG tablet Take 1 tab BID for 7 days; NO alcohol use for 10  days after prescription start (Patient not taking: Reported on 01/29/2023) 14 tablet 0   No current facility-administered medications on file prior to visit.    Allergies  Allergen Reactions   Cyclosporine Other (See Comments)    Medication interactions Medication interactions Medication interactions   Nitroglycerin Other (See Comments)    Medication interactions Medication interactions Medication interactions-Lung meds taken by pt. No Nitrates please   Tizanidine     Interaction with heart and lung medications Interaction with heart and lung medications   Nsaids     Reason: Gastric bypass   Other Other (See Comments)    Muscle relaxers - per patient interacts with other medications she is on.   Adhesive [Tape] Rash     Review of Systems Constitutional: negative for chills, fatigue, fevers and sweats Eyes: negative for irritation, redness and visual disturbance Ears, nose, mouth, throat, and face: negative for hearing loss, nasal congestion, snoring and tinnitus Respiratory: negative for asthma, cough, sputum Cardiovascular: negative for chest pain, dyspnea, exertional chest pressure/discomfort, irregular heart beat, palpitations and syncope Gastrointestinal: negative for abdominal pain, change in bowel habits, nausea and vomiting Genitourinary: negative for abnormal menstrual periods, genital lesions, sexual problems and vaginal discharge, dysuria and urinary incontinence Integument/breast: negative for breast lump, breast tenderness and nipple discharge Hematologic/lymphatic: negative for bleeding and easy bruising Musculoskeletal:negative for back pain and muscle weakness Neurological: negative for dizziness, headaches, vertigo and weakness Endocrine: negative for diabetic symptoms including polydipsia, polyuria and skin dryness Allergic/Immunologic: negative for hay fever and urticaria      Objective:  Blood pressure 127/83, pulse 89, height 5\' 8"  (1.727 m), weight 166  lb (75.3 kg), last menstrual period 04/14/2019. Body mass index  is 25.24 kg/m.    General Appearance:    Alert, cooperative, no distress, appears stated age  Head:    Normocephalic, without obvious abnormality, atraumatic  Eyes:    PERRL, conjunctiva/corneas clear, EOM's intact, both eyes  Ears:    Normal external ear canals, both ears  Nose:   Nares normal, septum midline, mucosa normal, no drainage or sinus tenderness  Throat:   Lips, mucosa, and tongue normal; teeth and gums normal  Neck:   Supple, symmetrical, trachea midline, no adenopathy; thyroid: no enlargement/tenderness/nodules; no carotid bruit or JVD  Back:     Symmetric, no curvature, ROM normal, no CVA tenderness  Lungs:     Clear to auscultation bilaterally, respirations unlabored  Chest Wall:    No tenderness or deformity   Heart:    Regular rate and rhythm, S1 and S2 normal, no murmur, rub or gallop  Breast Exam:    No tenderness, masses, or nipple abnormality  Abdomen:     Soft, non-tender, bowel sounds active all four quadrants, no masses, no organomegaly.    Genitalia:    Pelvic:external genitalia normal, vagina without lesions, discharge, or tenderness, rectovaginal septum  normal. Vaginal cuff normal, well- estrogenized  Rectal:    Hemorrhoids appreciated. Internal exam not done.   Extremities:   Extremities normal, atraumatic, no cyanosis or edema  Pulses:   2+ and symmetric all extremities  Skin:   Skin color, texture, turgor normal, no rashes or lesions  Lymph nodes:   Cervical, supraclavicular, and axillary nodes normal  Neurologic:   CNII-XII intact, normal strength, sensation and reflexes throughout   .  Labs:  Lab Results  Component Value Date   WBC 8.5 10/12/2020   HGB 13.9 10/12/2020   HCT 41.0 10/12/2020   MCV 93.4 10/12/2020   PLT 176.0 10/12/2020    Lab Results  Component Value Date   CREATININE 0.67 10/12/2020   BUN 10 10/12/2020   NA 137 10/12/2020   K 4.1 10/12/2020   CL 108 10/12/2020    CO2 23 10/12/2020    Lab Results  Component Value Date   ALT 13 05/08/2019   AST 12 (L) 05/08/2019   ALKPHOS 44 05/08/2019   BILITOT 0.7 05/08/2019    Lab Results  Component Value Date   TSH 1.610 01/28/2020     Assessment:   Hot flashes in spite of estrogen, celexa at max doses- reassurance given Desire for psychiatry referral - referral made    Plan:   Follow up in 1 year for annual exam   Allie Bossier, MD [facility]

## 2023-03-28 ENCOUNTER — Other Ambulatory Visit: Payer: Self-pay | Admitting: Obstetrics and Gynecology

## 2023-03-28 DIAGNOSIS — N760 Acute vaginitis: Secondary | ICD-10-CM

## 2023-04-02 ENCOUNTER — Emergency Department
Admission: EM | Admit: 2023-04-02 | Discharge: 2023-04-02 | Disposition: A | Discharge status: Home / Self Care | Payer: Medicare HMO | Attending: Emergency Medicine | Admitting: Emergency Medicine

## 2023-04-02 ENCOUNTER — Other Ambulatory Visit: Payer: Self-pay

## 2023-04-02 ENCOUNTER — Emergency Department: Payer: Medicare HMO

## 2023-04-02 DIAGNOSIS — R2241 Localized swelling, mass and lump, right lower limb: Secondary | ICD-10-CM | POA: Diagnosis not present

## 2023-04-02 DIAGNOSIS — R109 Unspecified abdominal pain: Secondary | ICD-10-CM | POA: Insufficient documentation

## 2023-04-02 DIAGNOSIS — E876 Hypokalemia: Secondary | ICD-10-CM | POA: Diagnosis not present

## 2023-04-02 DIAGNOSIS — R10A0 Flank pain, unspecified side: Secondary | ICD-10-CM

## 2023-04-02 DIAGNOSIS — M7989 Other specified soft tissue disorders: Secondary | ICD-10-CM

## 2023-04-02 LAB — CBC
HCT: 39.5 % (ref 36.0–46.0)
Hemoglobin: 13.8 g/dL (ref 12.0–15.0)
MCH: 34.2 pg — ABNORMAL HIGH (ref 26.0–34.0)
MCHC: 34.9 g/dL (ref 30.0–36.0)
MCV: 97.8 fL (ref 80.0–100.0)
Platelets: 165 10*3/uL (ref 150–400)
RBC: 4.04 MIL/uL (ref 3.87–5.11)
RDW: 12.7 % (ref 11.5–15.5)
WBC: 7.1 10*3/uL (ref 4.0–10.5)
nRBC: 0 % (ref 0.0–0.2)

## 2023-04-02 LAB — BASIC METABOLIC PANEL
Anion gap: 8 (ref 5–15)
BUN: 10 mg/dL (ref 6–20)
CO2: 23 mmol/L (ref 22–32)
Calcium: 9.6 mg/dL (ref 8.9–10.3)
Chloride: 105 mmol/L (ref 98–111)
Creatinine, Ser: 0.73 mg/dL (ref 0.44–1.00)
GFR, Estimated: >60 mL/min (ref >60)
Glucose, Bld: 106 mg/dL — ABNORMAL HIGH (ref 70–99)
Potassium: 2.8 mmol/L — ABNORMAL LOW (ref 3.5–5.1)
Sodium: 136 mmol/L (ref 135–145)

## 2023-04-02 LAB — URINALYSIS, ROUTINE W REFLEX MICROSCOPIC
Bilirubin Urine: NEGATIVE
Glucose, UA: NEGATIVE mg/dL
Hgb urine dipstick: NEGATIVE
Ketones, ur: NEGATIVE mg/dL
Leukocytes,Ua: NEGATIVE
Nitrite: NEGATIVE
Protein, ur: NEGATIVE mg/dL
Specific Gravity, Urine: 1.01 (ref 1.005–1.030)
pH: 5 (ref 5.0–8.0)

## 2023-04-02 LAB — POC URINE PREG, ED: Preg Test, Ur: NEGATIVE

## 2023-04-02 MED ORDER — PREDNISONE 20 MG PO TABS: 40.0000 mg | ORAL_TABLET | Freq: Every day | ORAL | 0 refills | Status: AC

## 2023-04-02 NOTE — ED Triage Notes (Signed)
Pt here with left flank pain. Pt also states it hurts to urinate. Pt denies NV but states she has diarrhea all the time. Pt also c/o right foot swelling, pt states she does not remember injuring her foot. Pt ambulatory to triage.

## 2023-04-02 NOTE — Discharge Instructions (Signed)
Your exam and labs are normal return at this time.  No signs of a urinary tract infection.  You have declined CT imaging at this time which can be performed as an outpatient through PCP since her symptoms have resolved.  Your foot x-ray did not reveal any acute fracture or dislocation.  Wear the postop shoe as needed for comfort.  Rest with the foot elevated and apply ice to reduce swelling.  Follow-up with primary provider or return to the ED if needed.

## 2023-04-04 NOTE — ED Provider Notes (Signed)
[facility] Emergency Department Provider Note     Event Date/Time   First MD Initiated Contact with Patient 04/02/23 1752     (approximate)   History   Flank Pain and Foot Swelling   HPI  Rachel Wall is a 42 y.o. female presents distal to the ED for evaluation of left leg pain.  Patient reports symptoms that came on this morning, with some associated difficulty passing urine based on her report.  She denies any associated nausea, vomiting, or hematuria.  Patient would endorse that at the time of this evaluation however, her symptoms had significantly improved.  She also complains of some unrelated right foot pain and dorsal swelling.  She denies any known injury, trauma, or contusion to the foot.  She denies any injury by insect bite or bee sting.  She denies any fevers, chills, sweats, chest pain, shortness of breath.  Patient surgical history includes gastric bypass and chronic hypokalemia.  Physical Exam   Triage Vital Signs: ED Triage Vitals  Enc Vitals Group     BP 04/02/23 1714 109/82     Pulse Rate 04/02/23 1712 70     Resp 04/02/23 1712 15     Temp 04/02/23 1712 98.2 F (36.8 C)     Temp Source 04/02/23 1712 Oral     SpO2 04/02/23 1712 100 %     Weight 04/02/23 1713 166 lb 0.1 oz (75.3 kg)     Height 04/02/23 1713 5\' 8"  (1.727 m)     Head Circumference --      Peak Flow --      Pain Score 04/02/23 1713 7     Pain Loc --      Pain Edu? --      Excl. in GC? --     Most recent vital signs: Vitals:   04/02/23 1714 04/02/23 2015  BP: 109/82 99/68  Pulse:  61  Resp:  20  Temp:  98 F (36.7 C)  SpO2:  99%    General Awake, no distress. NAD CV:  Good peripheral perfusion.  RESP:  Normal effort.  ABD:  No distention.  Nontender.  Normoactive bowel sounds noted.  No CVA tenderness elicited. MSK:  Right foot with dorsal soft tissue swelling noted.  No associated erythema, induration, warmth, pointing, or fluctuance.   ED Results /  Procedures / Treatments   Labs (all labs ordered are listed, but only abnormal results are displayed) Labs Reviewed  URINALYSIS, ROUTINE W REFLEX MICROSCOPIC - Abnormal; Notable for the following components:      Result Value   Color, Urine YELLOW (*)    APPearance HAZY (*)    All other components within normal limits  BASIC METABOLIC PANEL - Abnormal; Notable for the following components:   Potassium 2.8 (*)    Glucose, Bld 106 (*)    All other components within normal limits  CBC - Abnormal; Notable for the following components:   MCH 34.2 (*)    All other components within normal limits  POC URINE PREG, ED     EKG  RADIOLOGY  I personally viewed and evaluated these images as part of my medical decision making, as well as reviewing the written report by the radiologist.  ED Provider Interpretation: No acute findings  DG Right Foot  IMPRESSION: No acute bony abnormality.   PROCEDURES:  Critical Care performed: No  Procedures   MEDICATIONS ORDERED IN ED: Medications - No data to display  IMPRESSION / MDM / ASSESSMENT AND PLAN / ED COURSE  I reviewed the triage vital signs and the nursing notes.                              Differential diagnosis includes, but is not limited to, ovarian cyst, ovarian torsion, acute appendicitis, diverticulitis, urinary tract infection/pyelonephritis, bowel obstruction, colitis, renal colic, gastroenteritis, hernia, sprain, foot fracture, cellulitis etc.   Patient's presentation is most consistent with acute complicated illness / injury requiring diagnostic workup.  Patient's diagnosis is consistent with resolved left flank pain and dorsal right foot soft tissue swelling.  Patient with reassuring exam and workup overall.  Labs overall reassuring except redemonstrated hypokalemia at 2.8.  Patient declined oral administration in the ED, noting that she has prescription at home for ongoing use.  UA did not show any acute  leukocyturia.  Patient was offered but declined a CT scan at this time to evaluate the kidneys for her initial complaint of flank pain.  No radiologic evidence of any acute right foot fracture.  Soft tissue swelling concerning for possible allergic/irritant reaction.  Patient will be discharged home with prescriptions for prednisone for anti-inflammatory benefit.  She is also placed in a postop shoe for comfort patient is to follow up with her primary provider as discussed, as needed or otherwise directed. Patient is given ED precautions to return to the ED for any worsening or new symptoms.     FINAL CLINICAL IMPRESSION(S) / ED DIAGNOSES   Final diagnoses:  Flank pain  Swelling of right foot     Rx / DC Orders   ED Discharge Orders          Ordered    predniSONE (DELTASONE) 20 MG tablet  Daily with breakfast        04/02/23 2022             Note:  This document was prepared using Dragon voice recognition software and may include unintentional dictation errors.    Lissa Hoard, PA-C 04/04/23 0009    Jene Every, MD 04/05/23 [phone]

## 2023-04-05 ENCOUNTER — Other Ambulatory Visit: Payer: Self-pay | Admitting: Obstetrics & Gynecology

## 2023-04-05 DIAGNOSIS — Z780 Asymptomatic menopausal state: Secondary | ICD-10-CM

## 2023-07-29 ENCOUNTER — Telehealth: Payer: Self-pay

## 2023-07-29 DIAGNOSIS — Z780 Asymptomatic menopausal state: Secondary | ICD-10-CM

## 2023-07-29 MED ORDER — ESTRADIOL 2 MG PO TABS
2.0000 mg | ORAL_TABLET | Freq: Every day | ORAL | 5 refills | Status: AC
Start: 2023-07-29 — End: ?

## 2023-08-14 ENCOUNTER — Encounter: Payer: Self-pay | Admitting: Emergency Medicine

## 2023-08-14 ENCOUNTER — Other Ambulatory Visit: Payer: Self-pay

## 2023-08-14 ENCOUNTER — Emergency Department
Admission: EM | Admit: 2023-08-14 | Discharge: 2023-08-14 | Disposition: A | Discharge status: Home / Self Care | Payer: 59 | Attending: Emergency Medicine | Admitting: Emergency Medicine

## 2023-08-14 ENCOUNTER — Emergency Department: Payer: 59

## 2023-08-14 DIAGNOSIS — N12 Tubulo-interstitial nephritis, not specified as acute or chronic: Secondary | ICD-10-CM | POA: Insufficient documentation

## 2023-08-14 DIAGNOSIS — I509 Heart failure, unspecified: Secondary | ICD-10-CM | POA: Insufficient documentation

## 2023-08-14 DIAGNOSIS — I110 Hypertensive heart disease with heart failure: Secondary | ICD-10-CM | POA: Insufficient documentation

## 2023-08-14 DIAGNOSIS — N200 Calculus of kidney: Secondary | ICD-10-CM | POA: Diagnosis not present

## 2023-08-14 DIAGNOSIS — R109 Unspecified abdominal pain: Secondary | ICD-10-CM | POA: Diagnosis present

## 2023-08-14 LAB — CBC
HCT: 38.8 % (ref 36.0–46.0)
Hemoglobin: 13.5 g/dL (ref 12.0–15.0)
MCH: 34.2 pg — ABNORMAL HIGH (ref 26.0–34.0)
MCHC: 34.8 g/dL (ref 30.0–36.0)
MCV: 98.2 fL (ref 80.0–100.0)
Platelets: 191 10*3/uL (ref 150–400)
RBC: 3.95 MIL/uL (ref 3.87–5.11)
RDW: 12.4 % (ref 11.5–15.5)
WBC: 9.9 10*3/uL (ref 4.0–10.5)
nRBC: 0 % (ref 0.0–0.2)

## 2023-08-14 LAB — BASIC METABOLIC PANEL
Anion gap: 9 (ref 5–15)
BUN: 16 mg/dL (ref 6–20)
CO2: 25 mmol/L (ref 22–32)
Calcium: 9.6 mg/dL (ref 8.9–10.3)
Chloride: 99 mmol/L (ref 98–111)
Creatinine, Ser: 0.8 mg/dL (ref 0.44–1.00)
GFR, Estimated: >60 mL/min (ref >60)
Glucose, Bld: 87 mg/dL (ref 70–99)
Potassium: 3.9 mmol/L (ref 3.5–5.1)
Sodium: 133 mmol/L — ABNORMAL LOW (ref 135–145)

## 2023-08-14 LAB — URINALYSIS, ROUTINE W REFLEX MICROSCOPIC
Bilirubin Urine: NEGATIVE
Glucose, UA: NEGATIVE mg/dL
Ketones, ur: NEGATIVE mg/dL
Nitrite: POSITIVE — AB
Protein, ur: 100 mg/dL — AB
Specific Gravity, Urine: 1.014 (ref 1.005–1.030)
WBC, UA: >50 WBC/hpf (ref 0–5)
pH: 5 (ref 5.0–8.0)

## 2023-08-14 MED ORDER — TAMSULOSIN HCL 0.4 MG PO CAPS: 0.4000 mg | ORAL_CAPSULE | Freq: Every day | ORAL | 0 refills | Status: AC

## 2023-08-14 MED ORDER — LIDOCAINE HCL (PF) 1 % IJ SOLN
5.0000 mL | Freq: Once | INTRAMUSCULAR | Status: AC
  Administered 2023-08-14: 5 mL
  Filled 2023-08-14: qty 5

## 2023-08-14 MED ORDER — TRAMADOL HCL 50 MG PO TABS
50.0000 mg | ORAL_TABLET | Freq: Once | ORAL | Status: AC
  Administered 2023-08-14: 50 mg via ORAL
  Filled 2023-08-14: qty 1

## 2023-08-14 MED ORDER — CEPHALEXIN 500 MG PO CAPS: 500.0000 mg | ORAL_CAPSULE | Freq: Three times a day (TID) | ORAL | 0 refills | Status: AC

## 2023-08-14 MED ORDER — ONDANSETRON 4 MG PO TBDP: 4.0000 mg | ORAL_TABLET | Freq: Three times a day (TID) | ORAL | 0 refills | Status: DC | PRN

## 2023-08-14 MED ORDER — OXYCODONE-ACETAMINOPHEN 5-325 MG PO TABS: 1.0000 | ORAL_TABLET | Freq: Three times a day (TID) | ORAL | 0 refills | Status: AC | PRN

## 2023-08-14 MED ORDER — KETOROLAC TROMETHAMINE 30 MG/ML IJ SOLN
30.0000 mg | Freq: Once | INTRAMUSCULAR | Status: AC
  Administered 2023-08-14: 30 mg via INTRAMUSCULAR
  Filled 2023-08-14: qty 1

## 2023-08-14 MED ORDER — CEFTRIAXONE SODIUM 1 G IJ SOLR
500.0000 mg | Freq: Once | INTRAMUSCULAR | Status: AC
  Administered 2023-08-14: 500 mg via INTRAMUSCULAR
  Filled 2023-08-14: qty 10

## 2023-08-14 NOTE — ED Triage Notes (Signed)
Patient to ED via POV fro right sided flank plain. Started 2 days ago. Hx of same. States worse than normal. Taking zofran and tylenol for pain.

## 2023-08-14 NOTE — ED Provider Notes (Signed)
[facility] Emergency Department Provider Note     Event Date/Time   First MD Initiated Contact with Patient 08/14/23 1708     (approximate)   History   Flank Pain   HPI  Rachel Wall is a 42 y.o. female with a history of UTIs, kidney stones, gastric bypass, CHF, HTN, GERD, cardiac pacer, who presents to the ED for evaluation. She notes symptoms familiar to her, and consistent with renal colic. She notes onset of symptoms yesterday. She has been taking OJ, apple cider vinegar and Zofran for symptom relief. She denies FCS, gross hematuria, or urinary retention.   Physical Exam   Triage Vital Signs: ED Triage Vitals  Encounter Vitals Group     BP 08/14/23 1522 110/78     Systolic BP Percentile --      Diastolic BP Percentile --      Pulse Rate 08/14/23 1522 65     Resp 08/14/23 1522 18     Temp 08/14/23 1522 98.8 F (37.1 C)     Temp Source 08/14/23 1522 Oral     SpO2 08/14/23 1522 100 %     Weight 08/14/23 1523 145 lb (65.8 kg)     Height 08/14/23 1523 5\' 8"  (1.727 m)     Head Circumference --      Peak Flow --      Pain Score 08/14/23 1523 8     Pain Loc --      Pain Education --      Exclude from Growth Chart --     Most recent vital signs: Vitals:   08/14/23 1522 08/14/23 2008  BP: 110/78 115/69  Pulse: 65 60  Resp: 18 16  Temp: 98.8 F (37.1 C) (!) 97.4 F (36.3 C)  SpO2: 100% 100%    General Awake, no distress. NAD CV:  Good peripheral perfusion. RRR RESP:  Normal effort. CTA ABD:  No distention. Mild right flank tenderness   ED Results / Procedures / Treatments   Labs (all labs ordered are listed, but only abnormal results are displayed) Labs Reviewed  URINALYSIS, ROUTINE W REFLEX MICROSCOPIC - Abnormal; Notable for the following components:      Result Value   Color, Urine YELLOW (*)    APPearance CLOUDY (*)    Hgb urine dipstick MODERATE (*)    Protein, ur 100 (*)    Nitrite POSITIVE (*)    Leukocytes,Ua LARGE  (*)    Bacteria, UA FEW (*)    All other components within normal limits  BASIC METABOLIC PANEL - Abnormal; Notable for the following components:   Sodium 133 (*)    All other components within normal limits  CBC - Abnormal; Notable for the following components:   MCH 34.2 (*)    All other components within normal limits  URINE CULTURE    EKG   RADIOLOGY  I personally viewed and evaluated these images as part of my medical decision making, as well as reviewing the written report by the radiologist.  ED Provider Interpretation: No hydronephrosis noted  CT Renal Stone Study  IMPRESSION: Slight fullness of the right renal collecting system and ureter without visible ureteral stone currently. There does appear to be a 2 mm stone layering dependently in the urinary bladder, possibly recently passed stone.   Prior gastric bypass.  No complicating feature.   PROCEDURES:  Critical Care performed: No  Procedures   MEDICATIONS ORDERED IN ED: Medications  ketorolac (TORADOL) 30 MG/ML  injection 30 mg (30 mg Intramuscular Given 08/14/23 1741)  cefTRIAXone (ROCEPHIN) injection 500 mg (500 mg Intramuscular Given 08/14/23 2009)  lidocaine (PF) (XYLOCAINE) 1 % injection 5 mL (5 mLs Infiltration Given 08/14/23 2009)  traMADol (ULTRAM) tablet 50 mg (50 mg Oral Given 08/14/23 2024)     IMPRESSION / MDM / ASSESSMENT AND PLAN / ED COURSE  I reviewed the triage vital signs and the nursing notes.                              Differential diagnosis includes, but is not limited to, ovarian cyst, ovarian torsion, acute appendicitis, diverticulitis, urinary tract infection/pyelonephritis, endometriosis, bowel obstruction, colitis, renal colic, gastroenteritis, hernia, fibroids, endometriosis, pregnancy related pain including ectopic pregnancy, etc.  Patient's presentation is most consistent with acute complicated illness / injury requiring diagnostic workup.  Patient's diagnosis is  consistent with kidney stones and Pyelonephritis.  Patient is stable at this time for outpatient management.  She is afebrile with no acute leukocytosis.  No evidence of acute sepsis or toxic appearance.  Urine culture does confirm white blood cell clumps, hyaline casts and nitrite positive urine.  Urine culture is pending at this time.  Patient will be discharged home with prescriptions for Keflex, Zofran, Percocet, and tamsulosin. Patient is to follow up with urology as needed or otherwise directed. Patient is given ED precautions to return to the ED for any worsening or new symptoms.   FINAL CLINICAL IMPRESSION(S) / ED DIAGNOSES   Final diagnoses:  Kidney stone  Pyelonephritis     Rx / DC Orders   ED Discharge Orders          Ordered    oxyCODONE-acetaminophen (PERCOCET) 5-325 MG tablet  Every 8 hours PRN        08/14/23 2019    ondansetron (ZOFRAN-ODT) 4 MG disintegrating tablet  Every 8 hours PRN        08/14/23 2019    cephALEXin (KEFLEX) 500 MG capsule  3 times daily        08/14/23 2019    tamsulosin (FLOMAX) 0.4 MG CAPS capsule  Daily after supper        08/14/23 2019             Note:  This document was prepared using Dragon voice recognition software and may include unintentional dictation errors.    Lissa Hoard, PA-C 08/16/23 Rachel Wall    Phineas Semen, MD 08/20/23 [phone]

## 2023-08-14 NOTE — Discharge Instructions (Signed)
Take the prescription meds as directed. Follow-up with Urology as needed. Drink plenty of fluids and empty your bladder regularly.

## 2023-08-18 LAB — URINE CULTURE
Culture: >=100000
Special Requests: NORMAL

## 2023-10-18 IMAGING — MG MM BREAST LOCALIZATION CLIP
4 series · 4 of 12 positions shown · non-contrast
Comparison: Previous exam(s).

CLINICAL DATA: Status post ultrasound-guided core biopsy of
intraductal right breast mass

EXAM:
3D DIAGNOSTIC RIGHT MAMMOGRAM POST ULTRASOUND BIOPSY

[R CC synth-2D]
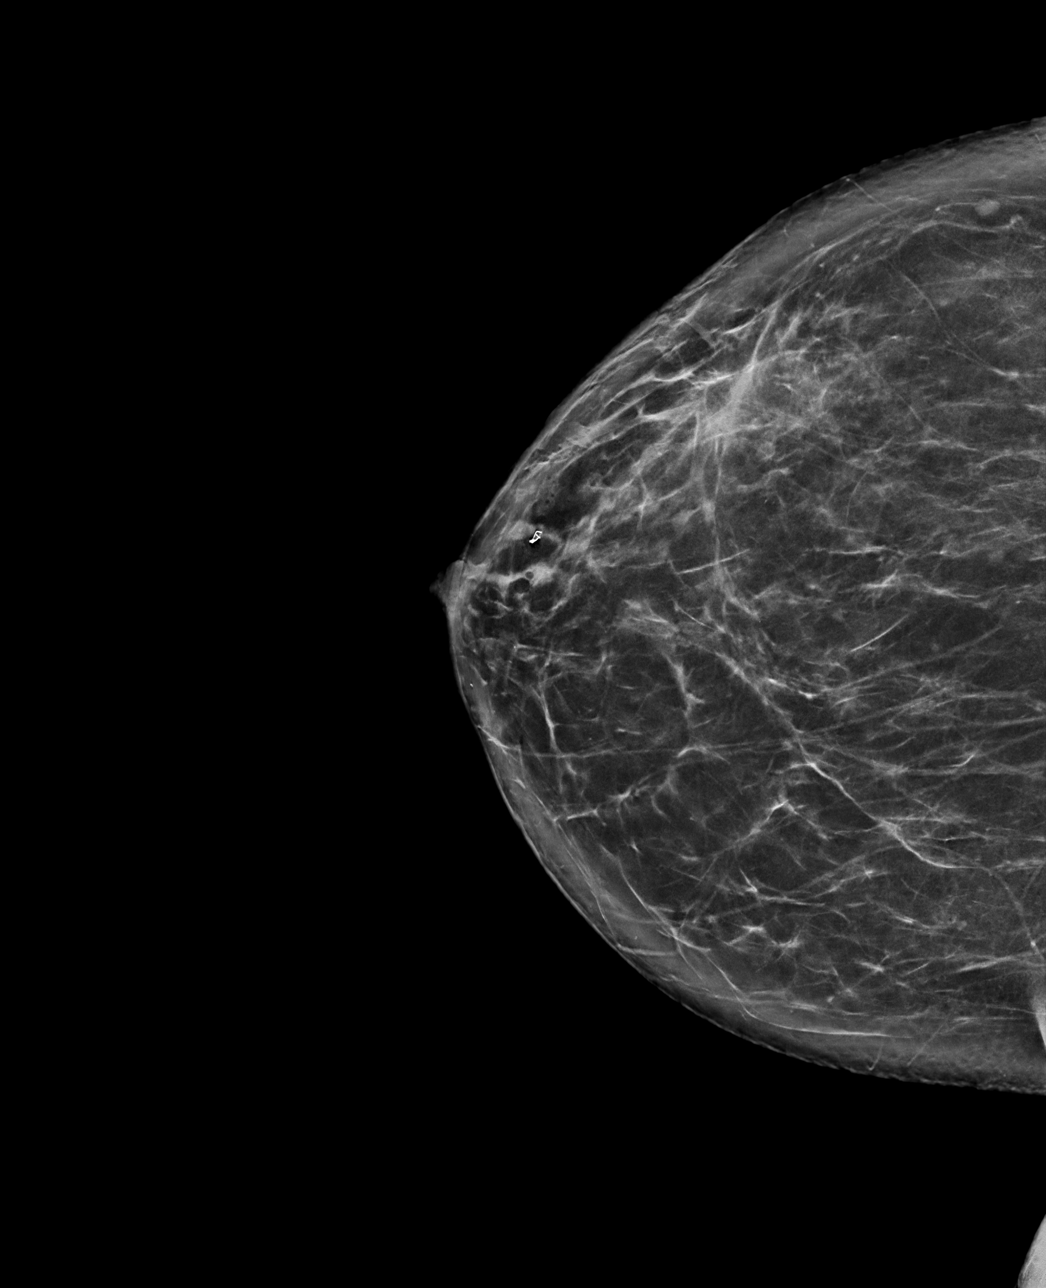

[R ML synth-2D]
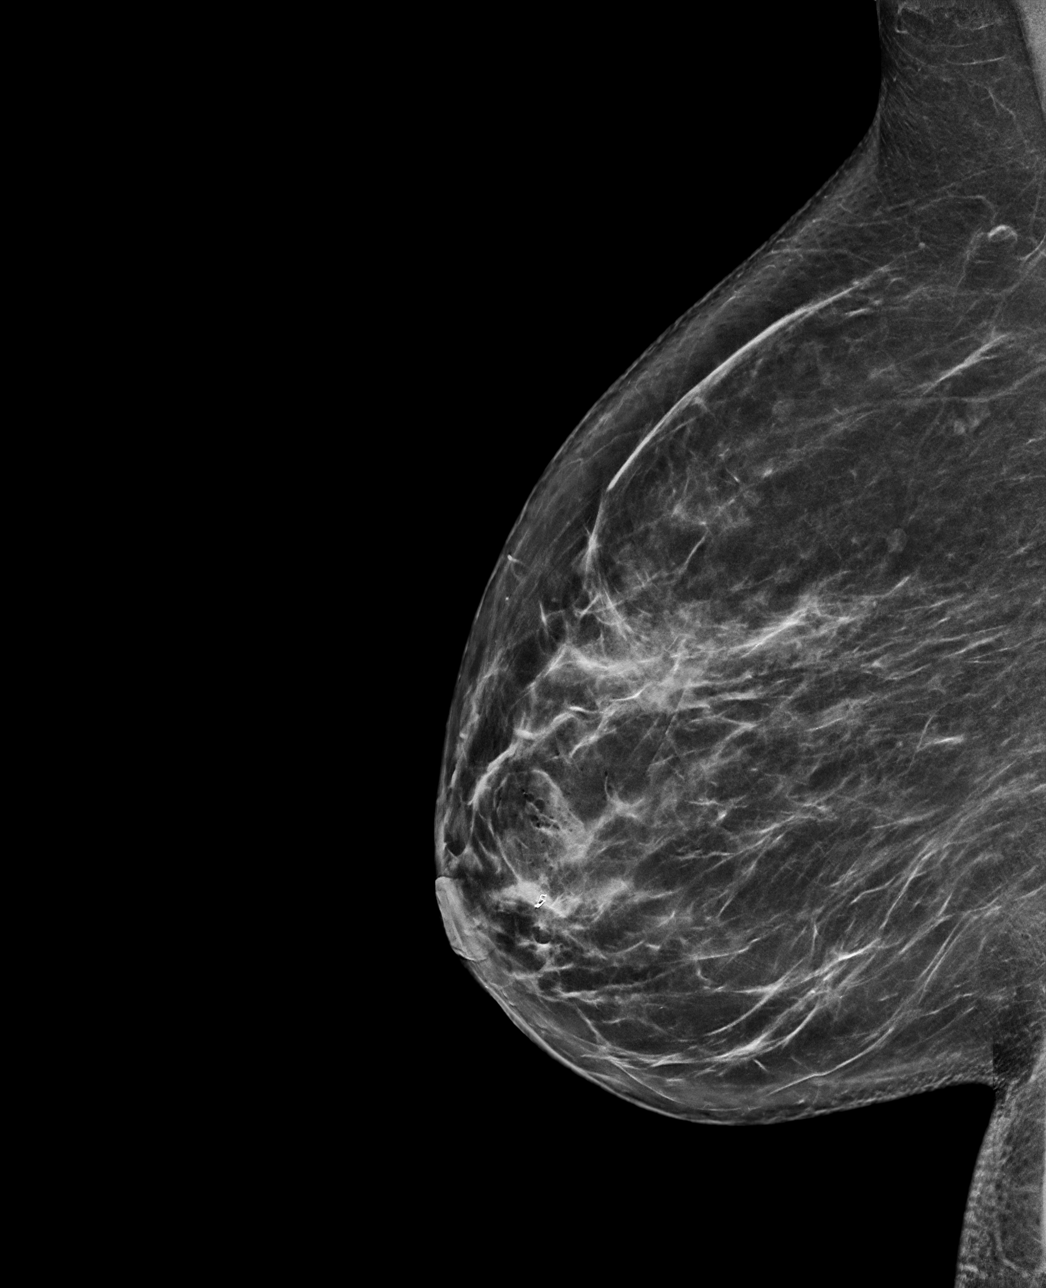

[R CC tomo · tomo slice 43/85.0]
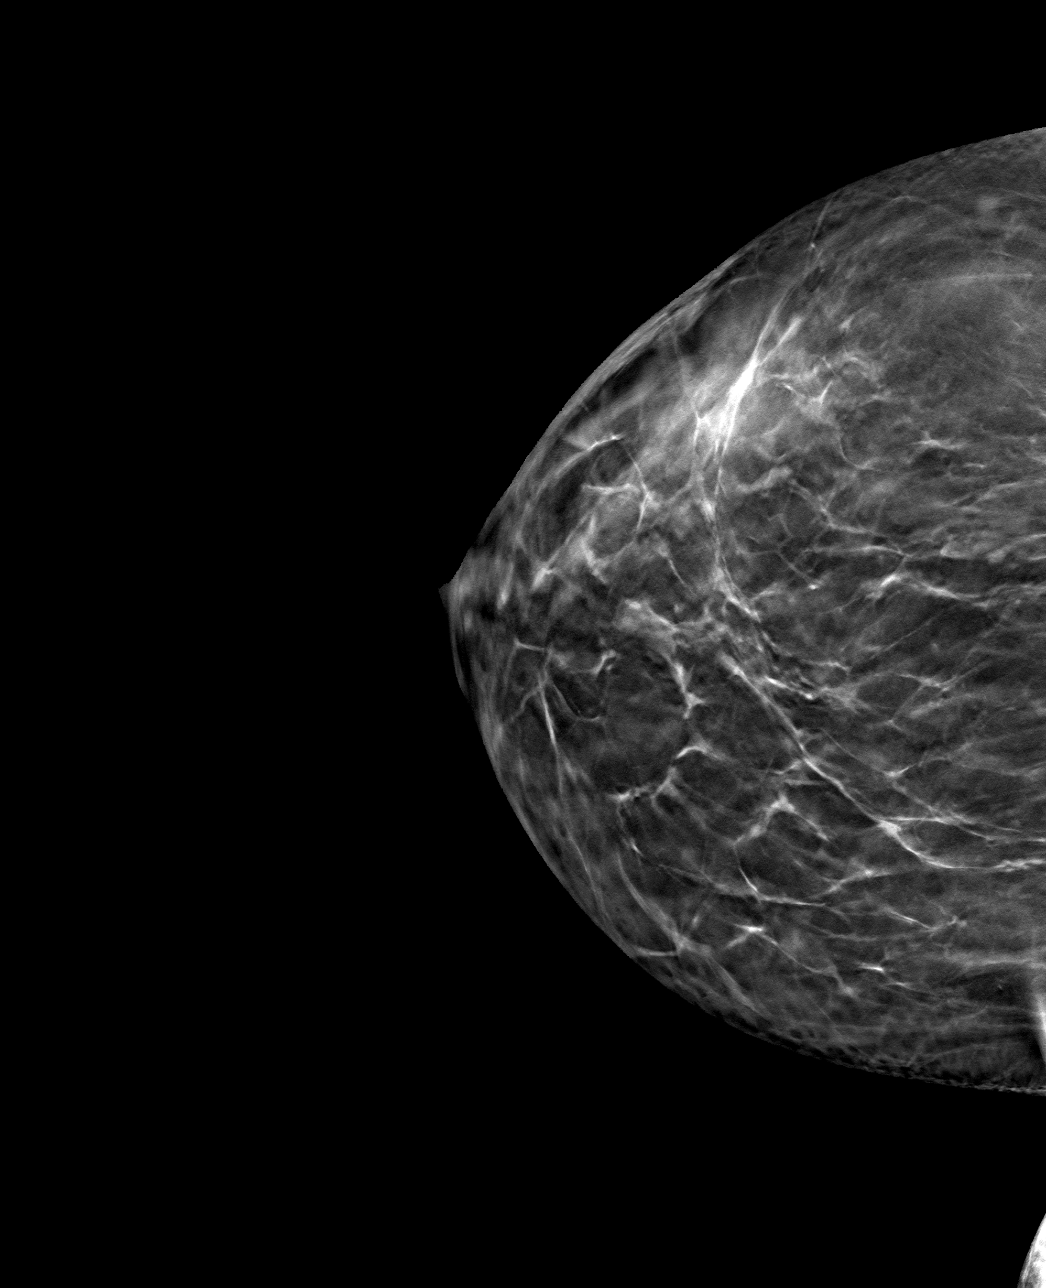

[R ML tomo · tomo slice 41/82.0]
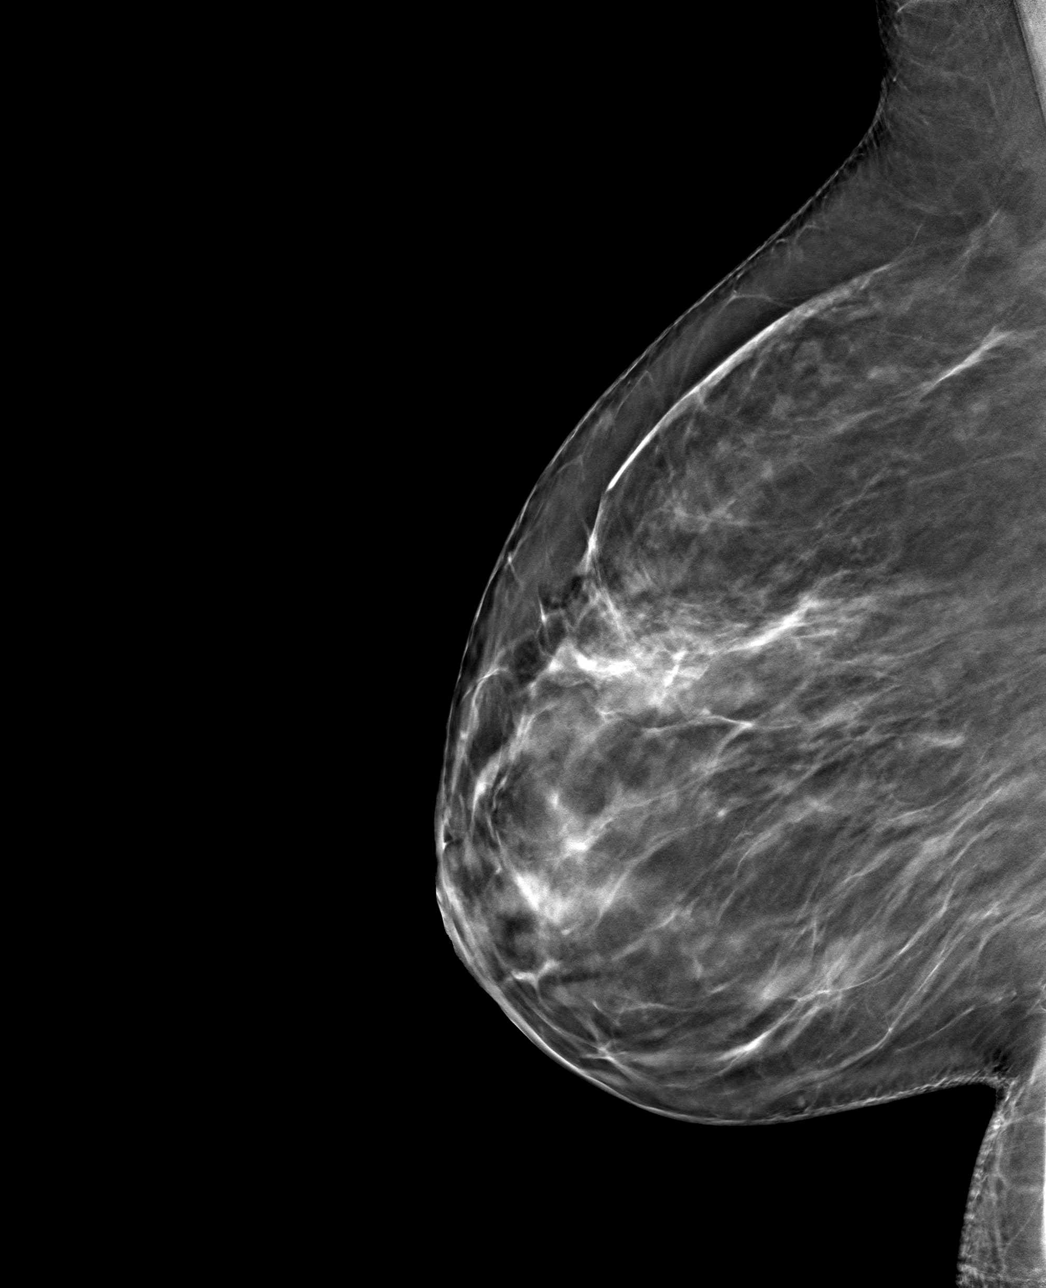

[4 of 12 positions shown; findings below may reference images not displayed]

FINDINGS: 3D Mammographic images were obtained following ultrasound guided
biopsy of intraductal right breast mass. The biopsy marking clip is
in expected position at the site of biopsy.
IMPRESSION: Appropriate positioning of the [place] shaped biopsy marking clip at
the site of biopsy in the expected location of concern.

Final Assessment: Post Procedure Mammograms for Marker Placement

## 2023-10-29 ENCOUNTER — Telehealth: Payer: Self-pay

## 2023-10-29 NOTE — Telephone Encounter (Signed)
Spoke with patient. Advised we have not received a refill request from her pharmacy. Our records show it was last rx'd 01/10/21 by Zipporah Plants, CNM who is no longer with our practice. Inquired if patient has requested refill from PCP. She advised she does not have a PCP. Chart reviewed. Patient last seen 01/29/23 by Nicholaus Bloom. Advised will send to on call provider for review.

## 2023-10-29 NOTE — Telephone Encounter (Signed)
Patient had last annual 01/29/23 with Dr. Marice Potter. Next annual not due til 01/29/24 (schedules not open for scheduling). Ok to fill?

## 2023-10-29 NOTE — Telephone Encounter (Signed)
From the last note, patient was requesting a referral to Psychiatry on her visit with Dr. Marice Potter.  Has that happened?  Also, her last prescription for Trazadone was from 2022.  Since her annual is not due until 4/30, she can be seen by a provider sooner just to reassess symptoms (mood check) and perhaps give a new prescription unless she has already established with a psychiatrist.

## 2023-10-29 NOTE — Telephone Encounter (Signed)
TRIAGE VOICEMAIL: Patient states her pharmacy has sent over a request for her traZODone (DESYREL) 50 MG tablet to be refilled. They have not received a response. Requesting return call.

## 2023-10-29 NOTE — Telephone Encounter (Signed)
Patient aware. She has not seen psychiatry. She hasn't found one that is covered by her insurance. Advised needs to schedule appointment for mood check in order to receive rx. Patient advised she will have to see when she can get off work. Advised she can also check to see if her insurance covers virtual visits with providers to get this taken care of. She will take z quil in the meantime and call us back if she is able to schedule an appointment.

## 2023-10-30 ENCOUNTER — Emergency Department
Admission: EM | Admit: 2023-10-30 | Discharge: 2023-10-30 | Disposition: A | Discharge status: Home / Self Care | Payer: 59 | Attending: Emergency Medicine | Admitting: Emergency Medicine

## 2023-10-30 ENCOUNTER — Emergency Department: Payer: 59

## 2023-10-30 ENCOUNTER — Encounter: Payer: Self-pay | Admitting: *Deleted

## 2023-10-30 DIAGNOSIS — S61309A Unspecified open wound of unspecified finger with damage to nail, initial encounter: Secondary | ICD-10-CM

## 2023-10-30 DIAGNOSIS — M79644 Pain in right finger(s): Secondary | ICD-10-CM | POA: Diagnosis not present

## 2023-10-30 DIAGNOSIS — S61310A Laceration without foreign body of right index finger with damage to nail, initial encounter: Secondary | ICD-10-CM | POA: Insufficient documentation

## 2023-10-30 DIAGNOSIS — Y9272 Chicken coop as the place of occurrence of the external cause: Secondary | ICD-10-CM | POA: Diagnosis not present

## 2023-10-30 DIAGNOSIS — S61210A Laceration without foreign body of right index finger without damage to nail, initial encounter: Secondary | ICD-10-CM | POA: Insufficient documentation

## 2023-10-30 DIAGNOSIS — W231XXA Caught, crushed, jammed, or pinched between stationary objects, initial encounter: Secondary | ICD-10-CM | POA: Diagnosis not present

## 2023-10-30 DIAGNOSIS — S6991XA Unspecified injury of right wrist, hand and finger(s), initial encounter: Secondary | ICD-10-CM | POA: Diagnosis present

## 2023-10-30 MED ORDER — ONDANSETRON 4 MG PO TBDP
4.0000 mg | ORAL_TABLET | Freq: Once | ORAL | Status: AC
  Administered 2023-10-30: 4 mg via ORAL
  Filled 2023-10-30: qty 1

## 2023-10-30 MED ORDER — HYDROCODONE-ACETAMINOPHEN 5-325 MG PO TABS: 1.0000 | ORAL_TABLET | Freq: Three times a day (TID) | ORAL | 0 refills | Status: AC | PRN

## 2023-10-30 MED ORDER — LIDOCAINE HCL (PF) 1 % IJ SOLN
5.0000 mL | Freq: Once | INTRAMUSCULAR | Status: AC
  Administered 2023-10-30: 5 mL
  Filled 2023-10-30 (×2): qty 5

## 2023-10-30 NOTE — ED Triage Notes (Signed)
Pt states that her finger was caught in a conveyor [place] and she has a deep laceration to left right index finger.  She states that the pain is severe, and the nail is pulled up.  Last tetanus shot 3 months ago. Bleeding controlled, dressing in place

## 2023-10-30 NOTE — Discharge Instructions (Addendum)
Keep the wound clean, dry, and covered. See your provider for wound check as needed. Wear the finger splint for protection.

## 2023-10-30 NOTE — ED Provider Notes (Cosign Needed)
[facility] Emergency Department Provider Note     Event Date/Time   First MD Initiated Contact with Patient 10/30/23 1553     (approximate)   History   Finger Injury   HPI  Rachel Wall is a 43 y.o. female to the MD for evaluation of acute right index finger nail bed injury and partial nail avulsion.  Patient presents with a distal portion of the nail partially avulsed, and bleeding from the nailbed.  It happened after she got her finger caught in a conveyor belt at a local chicken farm.  She denies any injury related to a crush or laceration.  Physical Exam   Triage Vital Signs: ED Triage Vitals  Encounter Vitals Group     BP 10/30/23 1333 120/83     Systolic BP Percentile --      Diastolic BP Percentile --      Pulse Rate 10/30/23 1333 70     Resp 10/30/23 1333 16     Temp 10/30/23 1333 98.3 F (36.8 C)     Temp Source 10/30/23 1333 Oral     SpO2 10/30/23 1333 100 %     Weight --      Height --      Head Circumference --      Peak Flow --      Pain Score 10/30/23 1332 8     Pain Loc --      Pain Education --      Exclude from Growth Chart --     Most recent vital signs: Vitals:   10/30/23 1333  BP: 120/83  Pulse: 70  Resp: 16  Temp: 98.3 F (36.8 C)  SpO2: 100%    General Awake, no distress. NAD HEENT NCAT. PERRL. EOMI. No rhinorrhea. Mucous membranes are moist.  CV:  Good peripheral perfusion.  RESP:  Normal effort.  ABD:  No distention.  MSK:  Right hand with normal composite fist.  Right index finger with a transverse laceration across the middle portion of the nail with obvious nailbed laceration reported.  The distal portion of the nail is partially avulsed.  Normal range of motion is appreciated.   ED Results / Procedures / Treatments   Labs (all labs ordered are listed, but only abnormal results are displayed) Labs Reviewed - No data to display   EKG   RADIOLOGY  I personally viewed and evaluated these  images as part of my medical decision making, as well as reviewing the written report by the radiologist.  ED Provider Interpretation: no acute findings  DG Finger Index Right Result Date: 10/30/2023 CLINICAL DATA:  Crush injury.  Pain EXAM: RIGHT INDEX FINGER 3V COMPARISON:  None Available. FINDINGS: There is no evidence of fracture or dislocation. There is no evidence of arthropathy or other focal bone abnormality. Soft tissues are unremarkable. IMPRESSION: No acute osseous abnormality Electronically Signed   By: Karen Kays M.D.   On: 10/30/2023 17:06     PROCEDURES:  Critical Care performed: No  Nail Removal  Date/Time: 10/30/2023 5:58 PM  Performed by: Lissa Hoard, PA-C Authorized by: Lissa Hoard, PA-C   Consent:    Consent obtained:  Verbal   Consent given by:  Patient   Risks, benefits, and alternatives were discussed: yes     Risks discussed:  Bleeding and pain Universal protocol:    Imaging studies available: yes     Site/side marked: yes     Patient  identity confirmed:  Verbally with patient Pre-procedure details:    Skin preparation:  Povidone-iodine   Preparation: Patient was prepped and draped in the usual sterile fashion   Procedure details:    Location:  Hand   Hand location:  R index finger Anesthesia:    Anesthesia method:  Nerve block   Block needle gauge:  27 G   Block anesthetic:  Lidocaine 1% w/o epi   Block injection procedure:  Anatomic landmarks identified and anatomic landmarks palpated   Block outcome:  Anesthesia achieved Nail Removal:    Nail removed:  Partial (distal)   Nail bed repaired: yes     Nail bed repair material:  5-0 vicryl   Number of sutures:  5   Removed nail replaced and anchored: no   Trephination:    Subungual hematoma drained: no   Ingrown nail:    Wedge excision of skin: no     Nail matrix removed or ablated:  None Post-procedure details:    Dressing:  Petrolatum-impregnated gauze and 4x4  sterile gauze   Procedure completion:  Tolerated well, no immediate complications    MEDICATIONS ORDERED IN ED: Medications  ondansetron (ZOFRAN-ODT) disintegrating tablet 4 mg (4 mg Oral Given 10/30/23 1339)  lidocaine (PF) (XYLOCAINE) 1 % injection 5 mL (5 mLs Infiltration Given 10/30/23 1612)     IMPRESSION / MDM / ASSESSMENT AND PLAN / ED COURSE  I reviewed the triage vital signs and the nursing notes.                              Differential diagnosis includes, but is not limited to, crush injury, nail avulsion, nailbed injury, tuft fracture  Patient's presentation is most consistent with acute complicated illness / injury requiring diagnostic workup.  Patient's diagnosis is consistent with partial distal nail avulsion and nailbed laceration following injury.  After wound repair is performed including distal nail removal and subsequent nailbed repair using resolvable sutures, patient be discharged with wound care instructions.  Patient will be discharged home with prescriptions for hydrocodone (#6). Patient is to follow up with PCP as discussed, as needed or otherwise directed. Patient is given ED precautions to return to the ED for any worsening or new symptoms.  FINAL CLINICAL IMPRESSION(S) / ED DIAGNOSES   Final diagnoses:  Laceration of right index finger without foreign body with damage to nail, initial encounter  Nail avulsion, finger, initial encounter     Rx / DC Orders   ED Discharge Orders     None        Note:  This document was prepared using Dragon voice recognition software and may include unintentional dictation errors.    Lissa Hoard, PA-C 11/01/23 0006

## 2023-10-30 NOTE — ED Notes (Signed)
Jenise, PA performing procedure on R index finger

## 2023-10-30 NOTE — ED Provider Triage Note (Signed)
Emergency Medicine Provider Triage Evaluation Note  Rachel Wall , a 43 y.o. female  was evaluated in triage.  Pt complains of finger injury, states most of the nail broke in half and is barely hanging on.  Just wants something for nausea, pain and to have the nail removed.  Review of Systems  Positive:  Negative:   Physical Exam  BP 120/83   Pulse 70   Temp 98.3 F (36.8 C) (Oral)   Resp 16   LMP 04/14/2019 (Exact Date)   SpO2 100%  Gen:   Awake, no distress   Resp:  Normal effort  MSK:   Moves extremities without difficulty  Other:    Medical Decision Making  Medically screening exam initiated at 1:43 PM.  Appropriate orders placed.  Rachel Wall was informed that the remainder of the evaluation will be completed by another provider, this initial triage assessment does not replace that evaluation, and the importance of remaining in the ED until their evaluation is complete.     Faythe Ghee, PA-C 10/30/23 1344

## 2023-10-30 NOTE — ED Notes (Signed)
Jenise, PA provided sutures for wound.

## 2024-01-17 ENCOUNTER — Other Ambulatory Visit: Payer: Self-pay

## 2024-01-17 ENCOUNTER — Observation Stay

## 2024-01-17 ENCOUNTER — Emergency Department

## 2024-01-17 ENCOUNTER — Inpatient Hospital Stay
Admission: EM | Admit: 2024-01-17 | Discharge: 2024-01-26 | DRG: 689 | Disposition: A | Discharge status: Home / Self Care | Attending: Internal Medicine | Admitting: Internal Medicine

## 2024-01-17 DIAGNOSIS — Z8041 Family history of malignant neoplasm of ovary: Secondary | ICD-10-CM

## 2024-01-17 DIAGNOSIS — Z9049 Acquired absence of other specified parts of digestive tract: Secondary | ICD-10-CM

## 2024-01-17 DIAGNOSIS — I959 Hypotension, unspecified: Secondary | ICD-10-CM | POA: Diagnosis not present

## 2024-01-17 DIAGNOSIS — Z886 Allergy status to analgesic agent status: Secondary | ICD-10-CM

## 2024-01-17 DIAGNOSIS — N949 Unspecified condition associated with female genital organs and menstrual cycle: Secondary | ICD-10-CM | POA: Diagnosis present

## 2024-01-17 DIAGNOSIS — Z803 Family history of malignant neoplasm of breast: Secondary | ICD-10-CM

## 2024-01-17 DIAGNOSIS — I2721 Secondary pulmonary arterial hypertension: Secondary | ICD-10-CM | POA: Diagnosis present

## 2024-01-17 DIAGNOSIS — M545 Low back pain, unspecified: Secondary | ICD-10-CM | POA: Diagnosis present

## 2024-01-17 DIAGNOSIS — Z881 Allergy status to other antibiotic agents status: Secondary | ICD-10-CM

## 2024-01-17 DIAGNOSIS — I110 Hypertensive heart disease with heart failure: Secondary | ICD-10-CM | POA: Diagnosis present

## 2024-01-17 DIAGNOSIS — Z808 Family history of malignant neoplasm of other organs or systems: Secondary | ICD-10-CM

## 2024-01-17 DIAGNOSIS — G43909 Migraine, unspecified, not intractable, without status migrainosus: Secondary | ICD-10-CM | POA: Diagnosis not present

## 2024-01-17 DIAGNOSIS — K2980 Duodenitis without bleeding: Secondary | ICD-10-CM | POA: Diagnosis present

## 2024-01-17 DIAGNOSIS — D649 Anemia, unspecified: Secondary | ICD-10-CM | POA: Diagnosis present

## 2024-01-17 DIAGNOSIS — N951 Menopausal and female climacteric states: Secondary | ICD-10-CM | POA: Diagnosis present

## 2024-01-17 DIAGNOSIS — R109 Unspecified abdominal pain: Principal | ICD-10-CM | POA: Diagnosis present

## 2024-01-17 DIAGNOSIS — N3000 Acute cystitis without hematuria: Secondary | ICD-10-CM

## 2024-01-17 DIAGNOSIS — Z950 Presence of cardiac pacemaker: Secondary | ICD-10-CM

## 2024-01-17 DIAGNOSIS — Z87442 Personal history of urinary calculi: Secondary | ICD-10-CM

## 2024-01-17 DIAGNOSIS — R1084 Generalized abdominal pain: Principal | ICD-10-CM

## 2024-01-17 DIAGNOSIS — N83201 Unspecified ovarian cyst, right side: Secondary | ICD-10-CM | POA: Diagnosis present

## 2024-01-17 DIAGNOSIS — G44209 Tension-type headache, unspecified, not intractable: Secondary | ICD-10-CM | POA: Diagnosis not present

## 2024-01-17 DIAGNOSIS — Z9981 Dependence on supplemental oxygen: Secondary | ICD-10-CM

## 2024-01-17 DIAGNOSIS — K219 Gastro-esophageal reflux disease without esophagitis: Secondary | ICD-10-CM | POA: Diagnosis present

## 2024-01-17 DIAGNOSIS — I5033 Acute on chronic diastolic (congestive) heart failure: Secondary | ICD-10-CM

## 2024-01-17 DIAGNOSIS — Z91048 Other nonmedicinal substance allergy status: Secondary | ICD-10-CM

## 2024-01-17 DIAGNOSIS — Z800 Family history of malignant neoplasm of digestive organs: Secondary | ICD-10-CM

## 2024-01-17 DIAGNOSIS — Z79899 Other long term (current) drug therapy: Secondary | ICD-10-CM

## 2024-01-17 DIAGNOSIS — N12 Tubulo-interstitial nephritis, not specified as acute or chronic: Principal | ICD-10-CM | POA: Diagnosis present

## 2024-01-17 DIAGNOSIS — Z90710 Acquired absence of both cervix and uterus: Secondary | ICD-10-CM

## 2024-01-17 DIAGNOSIS — I10 Essential (primary) hypertension: Secondary | ICD-10-CM | POA: Diagnosis present

## 2024-01-17 DIAGNOSIS — E876 Hypokalemia: Secondary | ICD-10-CM | POA: Diagnosis present

## 2024-01-17 DIAGNOSIS — F418 Other specified anxiety disorders: Secondary | ICD-10-CM | POA: Diagnosis present

## 2024-01-17 DIAGNOSIS — Z888 Allergy status to other drugs, medicaments and biological substances status: Secondary | ICD-10-CM

## 2024-01-17 DIAGNOSIS — Z8051 Family history of malignant neoplasm of kidney: Secondary | ICD-10-CM

## 2024-01-17 DIAGNOSIS — N390 Urinary tract infection, site not specified: Secondary | ICD-10-CM | POA: Diagnosis present

## 2024-01-17 DIAGNOSIS — Z9884 Bariatric surgery status: Secondary | ICD-10-CM

## 2024-01-17 DIAGNOSIS — M461 Sacroiliitis, not elsewhere classified: Secondary | ICD-10-CM

## 2024-01-17 DIAGNOSIS — Z720 Tobacco use: Secondary | ICD-10-CM | POA: Diagnosis present

## 2024-01-17 DIAGNOSIS — F17290 Nicotine dependence, other tobacco product, uncomplicated: Secondary | ICD-10-CM | POA: Diagnosis present

## 2024-01-17 DIAGNOSIS — Z7951 Long term (current) use of inhaled steroids: Secondary | ICD-10-CM

## 2024-01-17 DIAGNOSIS — I5032 Chronic diastolic (congestive) heart failure: Secondary | ICD-10-CM | POA: Diagnosis present

## 2024-01-17 DIAGNOSIS — Z8249 Family history of ischemic heart disease and other diseases of the circulatory system: Secondary | ICD-10-CM

## 2024-01-17 DIAGNOSIS — E871 Hypo-osmolality and hyponatremia: Secondary | ICD-10-CM | POA: Insufficient documentation

## 2024-01-17 DIAGNOSIS — R188 Other ascites: Secondary | ICD-10-CM

## 2024-01-17 DIAGNOSIS — R140 Abdominal distension (gaseous): Secondary | ICD-10-CM

## 2024-01-17 DIAGNOSIS — Z833 Family history of diabetes mellitus: Secondary | ICD-10-CM

## 2024-01-17 DIAGNOSIS — N281 Cyst of kidney, acquired: Secondary | ICD-10-CM

## 2024-01-17 DIAGNOSIS — Z79890 Hormone replacement therapy (postmenopausal): Secondary | ICD-10-CM

## 2024-01-17 DIAGNOSIS — K5900 Constipation, unspecified: Secondary | ICD-10-CM | POA: Insufficient documentation

## 2024-01-17 LAB — COMPREHENSIVE METABOLIC PANEL WITH GFR
ALT: 27 U/L (ref 0–44)
AST: 22 U/L (ref 15–41)
Albumin: 3.1 g/dL — ABNORMAL LOW (ref 3.5–5.0)
Alkaline Phosphatase: 93 U/L (ref 38–126)
Anion gap: 4 — ABNORMAL LOW (ref 5–15)
BUN: 11 mg/dL (ref 6–20)
CO2: 19 mmol/L — ABNORMAL LOW (ref 22–32)
Calcium: 8.7 mg/dL — ABNORMAL LOW (ref 8.9–10.3)
Chloride: 112 mmol/L — ABNORMAL HIGH (ref 98–111)
Creatinine, Ser: 0.71 mg/dL (ref 0.44–1.00)
GFR, Estimated: >60 mL/min (ref >60)
Glucose, Bld: 105 mg/dL — ABNORMAL HIGH (ref 70–99)
Potassium: 3.2 mmol/L — ABNORMAL LOW (ref 3.5–5.1)
Sodium: 135 mmol/L (ref 135–145)
Total Bilirubin: 0.6 mg/dL (ref 0.0–1.2)
Total Protein: 5.7 g/dL — ABNORMAL LOW (ref 6.5–8.1)

## 2024-01-17 LAB — CBC
HCT: 32.1 % — ABNORMAL LOW (ref 36.0–46.0)
Hemoglobin: 10.8 g/dL — ABNORMAL LOW (ref 12.0–15.0)
MCH: 33.6 pg (ref 26.0–34.0)
MCHC: 33.6 g/dL (ref 30.0–36.0)
MCV: 100 fL (ref 80.0–100.0)
Platelets: 132 10*3/uL — ABNORMAL LOW (ref 150–400)
RBC: 3.21 MIL/uL — ABNORMAL LOW (ref 3.87–5.11)
RDW: 13.5 % (ref 11.5–15.5)
WBC: 4.4 10*3/uL (ref 4.0–10.5)
nRBC: 0 % (ref 0.0–0.2)

## 2024-01-17 LAB — HEPATIC FUNCTION PANEL
ALT: 31 U/L (ref 0–44)
AST: 28 U/L (ref 15–41)
Albumin: 3.1 g/dL — ABNORMAL LOW (ref 3.5–5.0)
Alkaline Phosphatase: 117 U/L (ref 38–126)
Bilirubin, Direct: 0.2 mg/dL (ref 0.0–0.2)
Indirect Bilirubin: 0.2 mg/dL — ABNORMAL LOW (ref 0.3–0.9)
Total Bilirubin: 0.4 mg/dL (ref 0.0–1.2)
Total Protein: 5.8 g/dL — ABNORMAL LOW (ref 6.5–8.1)

## 2024-01-17 LAB — URINALYSIS, ROUTINE W REFLEX MICROSCOPIC
Bilirubin Urine: NEGATIVE
Glucose, UA: NEGATIVE mg/dL
Hgb urine dipstick: NEGATIVE
Ketones, ur: NEGATIVE mg/dL
Nitrite: POSITIVE — AB
Protein, ur: NEGATIVE mg/dL
Specific Gravity, Urine: 1.015 (ref 1.005–1.030)
pH: 5 (ref 5.0–8.0)

## 2024-01-17 LAB — LACTIC ACID, PLASMA: Lactic Acid, Venous: 1 mmol/L (ref 0.5–1.9)

## 2024-01-17 LAB — PHOSPHORUS: Phosphorus: 1.9 mg/dL — ABNORMAL LOW (ref 2.5–4.6)

## 2024-01-17 LAB — RETICULOCYTES
Immature Retic Fract: 21.3 % — ABNORMAL HIGH (ref 2.3–15.9)
RBC.: 3.37 MIL/uL — ABNORMAL LOW (ref 3.87–5.11)
Retic Count, Absolute: 101.1 10*3/uL (ref 19.0–186.0)
Retic Ct Pct: 3 % (ref 0.4–3.1)

## 2024-01-17 LAB — IRON AND TIBC
Iron: 28 ug/dL (ref 28–170)
Saturation Ratios: 7 % — ABNORMAL LOW (ref 10.4–31.8)
TIBC: 391 ug/dL (ref 250–450)
UIBC: 363 ug/dL

## 2024-01-17 LAB — BRAIN NATRIURETIC PEPTIDE: B Natriuretic Peptide: 90.6 pg/mL (ref 0.0–100.0)

## 2024-01-17 LAB — FERRITIN: Ferritin: 39 ng/mL (ref 11–307)

## 2024-01-17 LAB — APTT: aPTT: 33 s (ref 24–36)

## 2024-01-17 LAB — MAGNESIUM: Magnesium: 1.9 mg/dL (ref 1.7–2.4)

## 2024-01-17 LAB — PROTIME-INR
INR: 1.2 (ref 0.8–1.2)
Prothrombin Time: 15 s (ref 11.4–15.2)

## 2024-01-17 LAB — FOLATE: Folate: 20.6 ng/mL (ref >5.9)

## 2024-01-17 LAB — LIPASE, BLOOD: Lipase: 28 U/L (ref 11–51)

## 2024-01-17 MED ORDER — HEPARIN SODIUM (PORCINE) 5000 UNIT/ML IJ SOLN: 5000.0000 [IU] | Freq: Three times a day (TID) | INTRAMUSCULAR | Status: DC

## 2024-01-17 MED ORDER — ONDANSETRON HCL 4 MG/2ML IJ SOLN
4.0000 mg | Freq: Three times a day (TID) | INTRAMUSCULAR | Status: DC | PRN
  Administered 2024-01-18 – 2024-01-26 (×15): 4 mg via INTRAVENOUS
  Filled 2024-01-17 (×16): qty 2

## 2024-01-17 MED ORDER — HYDROMORPHONE HCL 1 MG/ML IJ SOLN
1.0000 mg | Freq: Once | INTRAMUSCULAR | Status: AC
  Administered 2024-01-17: 1 mg via INTRAVENOUS
  Filled 2024-01-17: qty 1

## 2024-01-17 MED ORDER — AMBRISENTAN 5 MG PO TABS: 10.0000 mg | ORAL_TABLET | Freq: Every day | ORAL | Status: DC

## 2024-01-17 MED ORDER — OXYCODONE-ACETAMINOPHEN 5-325 MG PO TABS
1.0000 | ORAL_TABLET | ORAL | Status: DC | PRN
  Administered 2024-01-17 – 2024-01-22 (×19): 1 via ORAL
  Filled 2024-01-17 (×19): qty 1

## 2024-01-17 MED ORDER — IOHEXOL 300 MG/ML  SOLN
100.0000 mL | Freq: Once | INTRAMUSCULAR | Status: AC | PRN
  Administered 2024-01-17: 100 mL via INTRAVENOUS

## 2024-01-17 MED ORDER — SODIUM CHLORIDE 0.9 % IV SOLN: 1.0000 g | INTRAVENOUS | Status: DC

## 2024-01-17 MED ORDER — TRAZODONE HCL 50 MG PO TABS
150.0000 mg | ORAL_TABLET | Freq: Every evening | ORAL | Status: DC | PRN
  Administered 2024-01-17 – 2024-01-25 (×9): 150 mg via ORAL
  Filled 2024-01-17 (×9): qty 1

## 2024-01-17 MED ORDER — ESTRADIOL 0.5 MG PO TABS
2.0000 mg | ORAL_TABLET | Freq: Every day | ORAL | Status: DC
  Administered 2024-01-18: 2 mg via ORAL
  Filled 2024-01-17 (×2): qty 4

## 2024-01-17 MED ORDER — AMBRISENTAN 5 MG PO TABS
10.0000 mg | ORAL_TABLET | Freq: Every day | ORAL | Status: DC
  Administered 2024-01-18 – 2024-01-26 (×9): 10 mg via ORAL
  Filled 2024-01-17 (×11): qty 2

## 2024-01-17 MED ORDER — HYDROMORPHONE HCL 1 MG/ML IJ SOLN
1.0000 mg | INTRAMUSCULAR | Status: DC | PRN
  Administered 2024-01-18 – 2024-01-19 (×3): 1 mg via INTRAVENOUS
  Filled 2024-01-17 (×3): qty 1

## 2024-01-17 MED ORDER — TADALAFIL (PAH) 20 MG PO TABS
40.0000 mg | ORAL_TABLET | Freq: Every day | ORAL | Status: DC
  Administered 2024-01-18 – 2024-01-26 (×9): 40 mg via ORAL
  Filled 2024-01-17 (×9): qty 2

## 2024-01-17 MED ORDER — ALBUTEROL SULFATE (2.5 MG/3ML) 0.083% IN NEBU: 2.5000 mg | INHALATION_SOLUTION | RESPIRATORY_TRACT | Status: DC | PRN

## 2024-01-17 MED ORDER — MORPHINE SULFATE (PF) 4 MG/ML IV SOLN
4.0000 mg | Freq: Once | INTRAVENOUS | Status: AC
  Administered 2024-01-17: 4 mg via INTRAVENOUS
  Filled 2024-01-17: qty 1

## 2024-01-17 MED ORDER — SELEXIPAG 1200 MCG PO TABS
1200.0000 ug | ORAL_TABLET | Freq: Two times a day (BID) | ORAL | Status: DC
  Administered 2024-01-20 – 2024-01-26 (×12): 1200 ug via ORAL
  Filled 2024-01-17 (×15): qty 1

## 2024-01-17 MED ORDER — DM-GUAIFENESIN ER 30-600 MG PO TB12: 1.0000 | ORAL_TABLET | Freq: Two times a day (BID) | ORAL | Status: DC | PRN

## 2024-01-17 MED ORDER — ACETAMINOPHEN 325 MG PO TABS
650.0000 mg | ORAL_TABLET | Freq: Four times a day (QID) | ORAL | Status: DC | PRN
  Administered 2024-01-18 – 2024-01-23 (×2): 650 mg via ORAL
  Filled 2024-01-17 (×2): qty 2

## 2024-01-17 MED ORDER — NICOTINE 21 MG/24HR TD PT24
21.0000 mg | MEDICATED_PATCH | Freq: Every day | TRANSDERMAL | Status: DC
  Administered 2024-01-17 – 2024-01-26 (×10): 21 mg via TRANSDERMAL
  Filled 2024-01-17 (×10): qty 1

## 2024-01-17 MED ORDER — ONDANSETRON HCL 4 MG/2ML IJ SOLN
4.0000 mg | Freq: Once | INTRAMUSCULAR | Status: AC
  Administered 2024-01-17: 4 mg via INTRAVENOUS
  Filled 2024-01-17: qty 2

## 2024-01-17 MED ORDER — POTASSIUM CHLORIDE CRYS ER 20 MEQ PO TBCR
40.0000 meq | EXTENDED_RELEASE_TABLET | Freq: Once | ORAL | Status: AC
  Administered 2024-01-17: 40 meq via ORAL
  Filled 2024-01-17: qty 2

## 2024-01-17 MED ORDER — PANTOPRAZOLE SODIUM 40 MG IV SOLR
40.0000 mg | Freq: Two times a day (BID) | INTRAVENOUS | Status: DC
  Administered 2024-01-17 – 2024-01-24 (×15): 40 mg via INTRAVENOUS
  Filled 2024-01-17 (×16): qty 10

## 2024-01-17 MED ORDER — SODIUM CHLORIDE 0.9 % IV SOLN
1.0000 g | Freq: Once | INTRAVENOUS | Status: AC
  Administered 2024-01-17: 1 g via INTRAVENOUS
  Filled 2024-01-17: qty 10

## 2024-01-17 MED ORDER — SODIUM CHLORIDE 0.9 % IV BOLUS
500.0000 mL | Freq: Once | INTRAVENOUS | Status: AC
  Administered 2024-01-17: 500 mL via INTRAVENOUS

## 2024-01-17 MED ORDER — CITALOPRAM HYDROBROMIDE 20 MG PO TABS
40.0000 mg | ORAL_TABLET | Freq: Every day | ORAL | Status: DC
  Administered 2024-01-18 – 2024-01-26 (×9): 40 mg via ORAL
  Filled 2024-01-17 (×9): qty 2

## 2024-01-17 MED ORDER — HYDRALAZINE HCL 20 MG/ML IJ SOLN: 5.0000 mg | INTRAMUSCULAR | Status: DC | PRN

## 2024-01-17 MED ORDER — SODIUM CHLORIDE 0.9 % IV BOLUS
1000.0000 mL | Freq: Once | INTRAVENOUS | Status: AC
  Administered 2024-01-17: 1000 mL via INTRAVENOUS

## 2024-01-17 MED ORDER — SODIUM CHLORIDE 0.9 % IV SOLN: INTRAVENOUS | Status: AC

## 2024-01-17 NOTE — H&P (Signed)
 History and Physical    Rachel Wall OZH:[id number] DOB: [birth year] DOA: 01/17/2024  Referring MD/NP/PA:   PCP: System, Provider Not In   Patient coming from:  The patient is coming from home.     Chief Complaint: Abdominal pain  HPI: Rachel Wall is a 43 y.o. female with medical history significant of HTN, pacemaker placement, dCHF, GERD, depression with anxiety, kidney stone, pulmonary hypertension, on 3.5-4 L of Oxygen at night, s/p of cholecystectomy, s/p of gastric bypass, s/p of hysterectomy who presents with abdominal pain.  Patient states that she has abdominal pain and abdominal distention for more than 2 days, which is located in right lower quadrant, constant, extremely severe, sharp, radiating to the back and right groin area, associated with nausea, no vomiting.  Patient states that she has chronic intermittent diarrhea which has not changed.  She attributed her diarrhea to Ambrisatan use.  Her last bowel movement was this morning.  No chest pain, cough, SOB.  Denies symptoms of UTI.  No dark stool or rectal bleeding.  Patient denies subjective fever and chills, but her temperature is 100.1 in ED.  Data reviewed independently and ED Course: pt was found to have WBC 4.4, BNP 90.6,  positive UA (hazy appearance, trace amount of leukocyte, positive nitrite, few bacteria, WBC 21-50), potassium 3.2, GFR> 60, hemoglobin 10.8 (13.8 on 08/14/2023), lactic acid 1.0, temperature normal, soft blood pressure 94/66, heart rate 93, RR 20, oxygen saturation 100% on room air.  Patient is placed in telemetry bed for observation.   EKG: I have personally reviewed.  Not done in ED, will get one.   ***   Review of Systems:   General: has fevers, no chills, no body weight gain, has poor appetite, has fatigue HEENT: no blurry vision, hearing changes or sore throat Respiratory: no dyspnea, coughing, wheezing CV: no chest pain, no palpitations GI: has nausea, abdominal pain, intermittent  diarrhea, abdominal distention, GU: no dysuria, burning on urination, increased urinary frequency, hematuria  Ext: no leg edema Neuro: no unilateral weakness, numbness, or tingling, no vision change or hearing loss Skin: no rash, no skin tear. MSK: No muscle spasm, no deformity, no limitation of range of movement in spin Heme: No easy bruising.  Travel history: No recent long distant travel.   Allergy:  Allergies  Allergen Reactions   Cyclosporine Other (See Comments)    Medication interactions   Nitrates, Organic     Can't take w/ other meds   Nitroglycerin Other (See Comments)    Medication interactions Medication interactions Medication interactions-Lung meds taken by pt. No Nitrates please   Tizanidine     Interaction with heart and lung medications Interaction with heart and lung medications   Nsaids     Reason: Gastric bypass   Other Other (See Comments)    Muscle relaxers - per patient interacts with other medications she is on.  Gastric bypass   Adhesive [Tape] Rash    Past Medical History:  Diagnosis Date   Anxiety    Arthritis    Bradycardia    has pacemaker   CHF (congestive heart failure) (HCC)    Depression    Dyspnea    Dysrhythmia    Family history of breast cancer    neg cancer genetic testing per pt (no results in chart)   Family history of ovarian cancer    GERD (gastroesophageal reflux disease)    Headache    Migraines   History of kidney stones  Hypertension    On home oxygen therapy    4 l at night- Oxygen sat - 80's   Presence of permanent cardiac pacemaker    Pulmonary hypertension (HCC)    Tachycardia     Past Surgical History:  Procedure Laterality Date   ABDOMINAL HYSTERECTOMY     BIOPSY  10/20/2020   Procedure: BIOPSY;  Surgeon: Normie Becton., MD;  Location: [facility] ENDOSCOPY;  Service: Gastroenterology;;   COLONOSCOPY WITH PROPOFOL  N/A 08/04/2020   Procedure: COLONOSCOPY WITH PROPOFOL ;  Surgeon: Luke Salaam, MD;   Location: [facility] ENDOSCOPY;  Service: Gastroenterology;  Laterality: N/A;   COLONOSCOPY WITH PROPOFOL  N/A 08/11/2020   Procedure: COLONOSCOPY WITH PROPOFOL ;  Surgeon: Luke Salaam, MD;  Location: [facility] ENDOSCOPY;  Service: Gastroenterology;  Laterality: N/A;   COLONOSCOPY WITH PROPOFOL  N/A 10/20/2020   Procedure: COLONOSCOPY WITH PROPOFOL ;  Surgeon: Mansouraty, Albino Alu., MD;  Location: [facility] ENDOSCOPY;  Service: Gastroenterology;  Laterality: N/A;   CYSTOSCOPY  05/12/2019   Procedure: CYSTOSCOPY;  Surgeon: Kris Pester, MD;  Location: [facility] ORS;  Service: Gynecology;;   CYSTOSCOPY     ENDOSCOPIC MUCOSAL RESECTION N/A 10/20/2020   Procedure: ENDOSCOPIC MUCOSAL RESECTION;  Surgeon: Normie Becton., MD;  Location: [facility] ENDOSCOPY;  Service: Gastroenterology;  Laterality: N/A;   ESOPHAGOGASTRODUODENOSCOPY (EGD) WITH PROPOFOL  N/A 10/20/2020   Procedure: ESOPHAGOGASTRODUODENOSCOPY (EGD) WITH PROPOFOL ;  Surgeon: Brice Campi Albino Alu., MD;  Location: [facility] ENDOSCOPY;  Service: Gastroenterology;  Laterality: N/A;   ESOPHAGOGASTRODUODENOSCOPY (EGD) WITH PROPOFOL  N/A 02/07/2021   Procedure: ESOPHAGOGASTRODUODENOSCOPY (EGD) WITH PROPOFOL ;  Surgeon: Luke Salaam, MD;  Location: [facility] ENDOSCOPY;  Service: Gastroenterology;  Laterality: N/A;   GASTRIC BYPASS     heart monitor implant and removal     HEMOSTASIS CLIP PLACEMENT  10/20/2020   Procedure: HEMOSTASIS CLIP PLACEMENT;  Surgeon: Normie Becton., MD;  Location: [facility] ENDOSCOPY;  Service: Gastroenterology;;   HOT HEMOSTASIS N/A 10/20/2020   Procedure: HOT HEMOSTASIS (ARGON PLASMA COAGULATION/BICAP);  Surgeon: Normie Becton., MD;  Location: [facility] ENDOSCOPY;  Service: Gastroenterology;  Laterality: N/A;   INSERT / REPLACE / REMOVE PACEMAKER     LITHOTRIPSY  2012   PACEMAKER INSERTION  2013   POLYPECTOMY  10/20/2020   Procedure: POLYPECTOMY;  Surgeon: Brice Campi Albino Alu., MD;  Location: [facility] ENDOSCOPY;  Service: Gastroenterology;;   Yuvonne Herald  REMOVAL     TOTAL LAPAROSCOPIC HYSTERECTOMY WITH SALPINGECTOMY Bilateral 05/12/2019   Procedure: TOTAL LAPAROSCOPIC HYSTERECTOMY;  Surgeon: Kris Pester, MD;  Location: [facility] ORS;  Service: Gynecology;  Laterality: Bilateral;   TUBAL LIGATION  10/10/2011    Social History:  reports that she quit smoking about 3 years ago. Her smoking use included cigarettes. She has never used smokeless tobacco. She reports that she does not currently use alcohol. She reports that she does not currently use drugs.  Family History:  Family History  Problem Relation Age of Onset   Healthy Mother    Lung disease Father    Heart disease Father    Diabetes Father    Hypertension Father    Kidney cancer Father 44   Colon cancer Paternal Grandfather 34   Colon cancer Sister 48   Breast cancer Paternal Aunt    Ovarian cancer Paternal Aunt    Stomach cancer Paternal Aunt    Brain cancer Paternal Uncle    Inflammatory bowel disease Neg Hx    Liver disease Neg Hx    Pancreatic cancer Neg Hx      Prior to Admission medications   Medication Sig  Start Date End Date Taking? Authorizing Provider  albuterol  (VENTOLIN  HFA) 108 (90 Base) MCG/ACT inhaler Inhale 2 puffs into the lungs every 6 (six) hours as needed for wheezing or shortness of breath.  11/10/18   [provider]  ambrisentan  (LETAIRIS ) 10 MG tablet Take 10 mg by mouth daily.     [provider]  Azelastine HCl 137 MCG/SPRAY SOLN Place into both nostrils. 05/21/22   [provider]  citalopram  (CELEXA ) 40 MG tablet Take 40 mg by mouth daily.    [provider]  diltiazem  (CARDIZEM ) 120 MG tablet Take 120-240 mg by mouth See admin instructions. Take 120 mg by mouth in the morning and take 240 mg by mouth at bedtime    [provider]  estradiol  (ESTRACE ) 2 MG tablet Take 1 tablet (2 mg total) by mouth daily. 07/29/23   Ana Balling, MD  famotidine  (PEPCID ) 20 MG tablet Take 80 mg by mouth 2 (two) times  daily.    [provider]  FLOVENT  HFA 110 MCG/ACT inhaler Inhale 2 puffs into the lungs 2 (two) times daily. 06/19/22   [provider]  furosemide  (LASIX ) 40 MG tablet Take 40 mg by mouth 2 (two) times daily. 05/15/22   [provider]  metroNIDAZOLE  (FLAGYL ) 500 MG tablet Take 1 tab BID for 7 days; NO alcohol use for 10 days after prescription start Patient not taking: Reported on 01/29/2023 07/09/22   Copland, Alicia B, PA-C  ondansetron  (ZOFRAN -ODT) 4 MG disintegrating tablet Take 1 tablet (4 mg total) by mouth every 8 (eight) hours as needed for nausea or vomiting. 08/14/23   Menshew, Raye Cai, PA-C  potassium chloride  SA (KLOR-CON  M) 20 MEQ tablet Take 20 mEq by mouth daily. 04/20/22   [provider]  Selexipag  1200 MCG TABS Take 1,200 mcg by mouth 2 (two) times daily.     [provider]  spironolactone  (ALDACTONE ) 50 MG tablet Take 50 mg by mouth daily. 06/01/22   [provider]  tadalafil , PAH, (ADCIRCA ) 20 MG tablet Take 40 mg by mouth daily. 06/04/19   [provider]  traZODone  (DESYREL ) 50 MG tablet TAKE 3 TABLETS(150 MG) BY MOUTH AT BEDTIME AS NEEDED FOR SLEEP 01/10/21   Veal, Katelyn, CNM    Physical Exam: Vitals:   01/17/24 2012 01/17/24 2017 01/17/24 2100 01/17/24 2134  BP:  125/76  112/65  Pulse: (!) 104 (!) 107  (!) 121  Resp:  20  18  Temp:  98.5 F (36.9 C)  100.1 F (37.8 C)  TempSrc:  Axillary    SpO2: 100% 100%  100%  Weight:   34.6 kg   Height:   5\' 8"  (1.727 m)    General: Patient is in moderate acute distress due to severe abdominal pain HEENT:       Eyes: PERRL, EOMI, no jaundice       ENT: No discharge from the ears and nose, no pharynx injection, no tonsillar enlargement.        Neck: No JVD, no bruit, no mass felt. Heme: No neck lymph node enlargement. Cardiac: S1/S2, RRR, No murmurs, No gallops or rubs. Respiratory: No rales, wheezing, rhonchi or rubs. GI: distended, has tenderness in the  right lower quadrant, BS present. GU: No hematuria Ext: No pitting leg edema bilaterally. 1+DP/PT pulse bilaterally. Musculoskeletal: No joint deformities, No joint redness or warmth, no limitation of ROM in spin. Skin: No rashes.  Neuro: Alert, oriented X3, cranial nerves II-XII grossly intact,  moves all extremities normally.  Psych: Patient is not psychotic, no suicidal or hemocidal ideation.  Labs on Admission: I have personally reviewed following labs and imaging studies  CBC: Recent Labs  Lab 01/17/24 1258  WBC 4.4  HGB 10.8*  HCT 32.1*  MCV 100.0  PLT 132*   Basic Metabolic Panel: Recent Labs  Lab 01/17/24 1258  NA 135  K 3.2*  CL 112*  CO2 19*  GLUCOSE 105*  BUN 11  CREATININE 0.71  CALCIUM 8.7*   GFR: Estimated Creatinine Clearance: 49.5 mL/min (by C-G formula based on SCr of 0.71 mg/dL). Liver Function Tests: Recent Labs  Lab 01/17/24 1258  AST 22  ALT 27  ALKPHOS 93  BILITOT 0.6  PROT 5.7*  ALBUMIN 3.1*   Recent Labs  Lab 01/17/24 1258  LIPASE 28   No results for input(s): "AMMONIA" in the last 168 hours. Coagulation Profile: Recent Labs  Lab 01/17/24 2130  INR 1.2   Cardiac Enzymes: No results for input(s): "CKTOTAL", "CKMB", "CKMBINDEX", "TROPONINI" in the last 168 hours. BNP (last 3 results) No results for input(s): "PROBNP" in the last 8760 hours. HbA1C: No results for input(s): "HGBA1C" in the last 72 hours. CBG: No results for input(s): "GLUCAP" in the last 168 hours. Lipid Profile: No results for input(s): "CHOL", "HDL", "LDLCALC", "TRIG", "CHOLHDL", "LDLDIRECT" in the last 72 hours. Thyroid Function Tests: No results for input(s): "TSH", "T4TOTAL", "FREET4", "T3FREE", "THYROIDAB" in the last 72 hours. Anemia Panel: Recent Labs    01/17/24 2130  RETICCTPCT 3.0   Urine analysis:    Component Value Date/Time   COLORURINE Reighlyn (A) 01/17/2024 1239   APPEARANCEUR HAZY (A) 01/17/2024 1239   LABSPEC 1.015 01/17/2024 1239    PHURINE 5.0 01/17/2024 1239   GLUCOSEU NEGATIVE 01/17/2024 1239   HGBUR NEGATIVE 01/17/2024 1239   BILIRUBINUR NEGATIVE 01/17/2024 1239   BILIRUBINUR neg 07/09/2022 1713   KETONESUR NEGATIVE 01/17/2024 1239   PROTEINUR NEGATIVE 01/17/2024 1239   NITRITE POSITIVE (A) 01/17/2024 1239   LEUKOCYTESUR TRACE (A) 01/17/2024 1239   Sepsis Labs: @LABRCNTIP (procalcitonin:4,lacticidven:4) )No results found for this or any previous visit (from the past 240 hours).   Radiological Exams on Admission:   Assessment/Plan Principal Problem:   Abdominal pain Active Problems:   Duodenitis   Adnexal cyst   UTI (urinary tract infection)   Kidney cysts   Pulmonary arterial hypertension (HCC)   Chronic diastolic CHF (congestive heart failure) (HCC)   HTN (hypertension)   Normocytic anemia   Hypokalemia   Tobacco use   Depression with anxiety   Assessment and Plan:  Principal Problem:   Abdominal pain Active Problems:   Duodenitis   Adnexal cyst   UTI (urinary tract infection)   Kidney cysts   Pulmonary arterial hypertension (HCC)   Chronic diastolic CHF (congestive heart failure) (HCC)   HTN (hypertension)   Normocytic anemia   Hypokalemia   Tobacco use   Depression with anxiety    DVT ppx: SCD  Code Status: Full code   Family Communication:     not done, no family member is at bed side.   Disposition Plan:  Anticipate discharge back to previous environment  Consults called:  ***   Admission status and Level of care: Telemetry Medical:    for obs     Dispo: The patient is from: Home              Anticipated d/c is to: Home  Anticipated d/c date is: 1 day              Patient currently is not medically stable to d/c.    Severity of Illness:  The appropriate patient status for this patient is OBSERVATION. Observation status is judged to be reasonable and necessary in order to provide the required intensity of service to ensure the patient's safety. The  patient's presenting symptoms, physical exam findings, and initial radiographic and laboratory data in the context of their medical condition is felt to place them at decreased risk for further clinical deterioration. Furthermore, it is anticipated that the patient will be medically stable for discharge from the hospital within 2 midnights of admission.        Date of Service 01/17/2024    [facility]   If 7PM-7AM, please contact night-coverage [email or web address] 01/17/2024, 10:18 PM

## 2024-01-17 NOTE — ED Triage Notes (Signed)
 Patient comes in from home via POV with complaints of abdominal pain for the past 2 days. Pt states that she notices swelling to her abdomen. Pt states that she has had a couple small BM's, but hasn't been having her normal BM'S. Pt states that she has gained 20 lbs in the past week. Pt states the the lower right quadrant of her abdomen is painful to the touch. Pt states that she had her gall bladder removed back in 2022, and had a gastric bypass Feb OF 2023. Pt endorses pain 10/10. Pt is alert and oriented x4,and is uncomfortable on presentation.

## 2024-01-17 NOTE — ED Provider Notes (Signed)
 Physical Exam  BP 125/76 (BP Location: Right Arm)   Pulse (!) 107   Temp 98.5 F (36.9 C) (Axillary)   Resp 20   Ht 5' 8 (1.727 m)   Wt 34.6 kg   LMP 04/14/2019 (Exact Date)   SpO2 100%   BMI 11.59 kg/m   Physical Exam Constitutional:      Appearance: She is well-developed.  HENT:     Head: Normocephalic and atraumatic.  Eyes:     Conjunctiva/sclera: Conjunctivae normal.  Cardiovascular:     Rate and Rhythm: Normal rate.     Pulses: Normal pulses.     Heart sounds: Normal heart sounds.  Pulmonary:     Effort: Pulmonary effort is normal. No respiratory distress.  Abdominal:     General: Bowel sounds are normal. There is distension.     Palpations: There is no mass.     Tenderness: There is abdominal tenderness. There is guarding. There is no right CVA tenderness or left CVA tenderness.  Musculoskeletal:        General: Normal range of motion.     Cervical back: Normal range of motion.  Skin:    General: Skin is warm.     Findings: No rash.  Neurological:     General: No focal deficit present.     Mental Status: She is alert and oriented to person, place, and time. Mental status is at baseline.  Psychiatric:        Mood and Affect: Mood normal.        Behavior: Behavior normal.        Thought Content: Thought content normal.     Procedures  Procedures  ED Course / MDM   Clinical Course as of 01/17/24 2120  Fri Jan 17, 2024  1841 CT ABDOMEN PELVIS W CONTRAST [RK]    Clinical Course User Index [RK] Arlander Charleston, MD   Medical Decision Making Amount and/or Complexity of Data Reviewed Labs: ordered. Radiology: ordered. Decision-making details documented in ED Course.  Risk Prescription drug management. Decision regarding hospitalization.  CLINICAL DATA:  Acute abdominal pain, bowel obstruction suspected. Swelling in abdomen. 20 pound weight gain in the past week. Gastric bypass in February of 2023. Lower right quadrant pain.   EXAM: CT ABDOMEN AND  PELVIS WITH CONTRAST   TECHNIQUE: Multidetector CT imaging of the abdomen and pelvis was performed using the standard protocol following bolus administration of intravenous contrast.   RADIATION DOSE REDUCTION: This exam was performed according to the departmental dose-optimization program which includes automated exposure control, adjustment of the mA and/or kV according to patient size and/or use of iterative reconstruction technique.   CONTRAST:  OMNIPAQUE  IOHEXOL  300 MG/ML  SOLN   COMPARISON:  CT 08/14/2023   FINDINGS: Lower chest: No acute abnormality.   Hepatobiliary: Periportal edema. Cholecystectomy. No biliary dilation.   Pancreas: No pancreatic ductal dilation. There is some fluid and stranding about the pancreatic head. However this appears centered about the descending duodenum.   Spleen: Splenomegaly measuring 15.1 cm in craniocaudal dimension.   Adrenals/Urinary Tract: Normal adrenal glands. 2.3 cm cystic lesion in the posterior left kidney is slightly denser than simple fluid density (Hounsfield units 34). Delayed right nephrogram. Urothelial thickening and hyperenhancement about the proximal right ureter and right renal pelvis. Mild right hydronephrosis. No evidence of obstructing stone. Bladder is unremarkable.   Stomach/Bowel: Postoperative change of Roux-en-Y gastric bypass. Mild wall thickening about the descending duodenum with adjacent fluid and stranding. No organized fluid collection.  No free intraperitoneal air. No evidence of obstruction. The appendix is normal.   Vascular/Lymphatic: No significant vascular findings are present. No enlarged abdominal or pelvic lymph nodes.   Reproductive: Hysterectomy. 4.5 cm right adnexal cyst with adjacent small volume free fluid in the pelvis.   Other: Free fluid about the descending duodenum/pancreatic head extending inferiorly in the anterior pararenal space. Additional free fluid in the pelvis. No  free intraperitoneal air. No drainable fluid collection.   Musculoskeletal: No acute fracture.  Body wall edema.   IMPRESSION: 1. Free fluid about the descending duodenum and pancreatic head. The fluid appears centered about the duodenum and is favored to represent duodenitis however groove pancreatitis could appear similarly. Correlate with lipase. No organized fluid collection. No free intraperitoneal air. 2. Delayed right nephrogram with urothelial thickening and hyperenhancement about the proximal right ureter and right renal pelvis. Mild right hydronephrosis. No evidence of obstructing stone. Findings are concerning for ascending urinary tract infection. 3. 4.5 cm right adnexal cyst with adjacent small volume free fluid in the pelvis. In the setting of right lower quadrant abdominal pain this could be further evaluated with pelvic ultrasound. 4. 2.3 cm cystic lesion in the posterior left kidney is slightly denser than simple fluid density. Further evaluation with nonemergent renal protocol MRI with and without IV contrast is recommended. 5. Splenomegaly. 6. Periportal and body wall edema.     Electronically Signed   By: Norman Gatlin M.D.   On: 01/17/2024 18:20               43 year old female with increasing abdominal pain over the last few days.  Urinalysis positive for UTI.  She was started on IV ceftriaxone .  Urine culture pending.  CBC and CMP shows slight hypokalemia 3.2, no leukocytosis.  Mild drop in hemoglobin to 10.8 from baseline of 13.  Lactic acid normal.  Lipase and liver enzymes within normal limits.  CT abdomen pelvis showed signs of duodenitis, ascending UTI which is consistent with urinalysis, 4.5 cm adnexal cyst.  Patient's pain remained moderate to severe but intermittently controlled with pain medication.  Discussed case with hospitalist and general surgery.  General surgery felt no surgical indication at this time.  Will admit to hospitalist  service for continued monitoring, pain control and treatment.      Zuhair Lariccia C, PA-C 01/17/24 2126    Arlander Charleston, MD 01/21/24 [phone]

## 2024-01-17 NOTE — ED Provider Notes (Signed)
 [facility] Provider Note    Event Date/Time   First MD Initiated Contact with Patient 01/17/24 1343     (approximate)   History   Abdominal Pain   HPI  Rachel Wall is a 43 y.o. female history of gastric bypass, tubal ligation, hysterectomy, presents emergency department complaint of abdominal pain.  Patient states swelling in the abdomen started yesterday and now she looks like she is 6 months pregnant.  Was able to have a small bowel movement yesterday and today.  Has to push the urine out.  No known history of ovarian cyst.  Denies fever or chills.  No vomiting or diarrhea.  Just abdominal pain.      Physical Exam   Triage Vital Signs: ED Triage Vitals  Encounter Vitals Group     BP 01/17/24 1248 (!) 98/56     Systolic BP Percentile --      Diastolic BP Percentile --      Pulse Rate 01/17/24 1248 88     Resp 01/17/24 1248 19     Temp 01/17/24 1248 98.1 F (36.7 C)     Temp src --      SpO2 01/17/24 1248 100 %     Weight 01/17/24 1249 161 lb (73 kg)     Height 01/17/24 1249 5\' 9"  (1.753 m)     Head Circumference --      Peak Flow --      Pain Score 01/17/24 1249 10     Pain Loc --      Pain Education --      Exclude from Growth Chart --     Most recent vital signs: Vitals:   01/17/24 1248  BP: (!) 98/56  Pulse: 88  Resp: 19  Temp: 98.1 F (36.7 C)  SpO2: 100%     General: Awake, no distress.   CV:  Good peripheral perfusion. regular rate and  rhythm Resp:  Normal effort. Lungs cta Abd:  Abdomen distended, tender to palpation all 4 quadrants, high-pitched bowel sounds noted all 4 quads Other:      ED Results / Procedures / Treatments   Labs (all labs ordered are listed, but only abnormal results are displayed) Labs Reviewed  COMPREHENSIVE METABOLIC PANEL WITH GFR - Abnormal; Notable for the following components:      Result Value   Potassium 3.2 (*)    Chloride 112 (*)    CO2 19 (*)    Glucose, Bld 105 (*)     Calcium 8.7 (*)    Total Protein 5.7 (*)    Albumin 3.1 (*)    Anion gap 4 (*)    All other components within normal limits  CBC - Abnormal; Notable for the following components:   RBC 3.21 (*)    Hemoglobin 10.8 (*)    HCT 32.1 (*)    Platelets 132 (*)    All other components within normal limits  URINALYSIS, ROUTINE W REFLEX MICROSCOPIC - Abnormal; Notable for the following components:   Color, Urine Britanny (*)    APPearance HAZY (*)    Nitrite POSITIVE (*)    Leukocytes,Ua TRACE (*)    Bacteria, UA FEW (*)    All other components within normal limits  LIPASE, BLOOD  BRAIN NATRIURETIC PEPTIDE     EKG     RADIOLOGY CT abdomen/pelvis IV contrast    PROCEDURES:   Procedures Chief Complaint  Patient presents with   Abdominal Pain  MEDICATIONS ORDERED IN ED: Medications  HYDROmorphone  (DILAUDID ) injection 1 mg (has no administration in time range)  morphine  (PF) 4 MG/ML injection 4 mg (4 mg Intravenous Given 01/17/24 1430)  ondansetron  (ZOFRAN ) injection 4 mg (4 mg Intravenous Given 01/17/24 1429)  sodium chloride  0.9 % bolus 500 mL (500 mLs Intravenous New Bag/Given 01/17/24 1428)  iohexol  (OMNIPAQUE ) 300 MG/ML solution 100 mL (100 mLs Intravenous Contrast Given 01/17/24 1517)     IMPRESSION / MDM / ASSESSMENT AND PLAN / ED COURSE  I reviewed the triage vital signs and the nursing notes.                              Differential diagnosis includes, but is not limited to, congestive heart failure, bowel obstruction, cirrhosis, ascites, ovarian cyst, constipation  Patient's presentation is most consistent with acute illness / injury with system symptoms.    Labs with decreased hemoglobin of 10.8.  4 months ago patient's hemoglobin was 15, platelets decreased at 132, potassium a little decreased at 3.2 lipase reassuring  CT abdomen pelvis IV contrast ordered  BNP reassuring, urinalysis does show positive nitrites and trace of leuks however do not feel  like patient has UTI causing the abdominal pain and bloating  Care transferred to Thomos Flies, PA-C     FINAL CLINICAL IMPRESSION(S) / ED DIAGNOSES   Final diagnoses:  Generalized abdominal pain     Rx / DC Orders   ED Discharge Orders     None        Note:  This document was prepared using Dragon voice recognition software and may include unintentional dictation errors.    Delsie Figures, PA-C 01/17/24 1522    Claria Crofts, MD 01/17/24 [phone]

## 2024-01-18 DIAGNOSIS — M545 Low back pain, unspecified: Secondary | ICD-10-CM | POA: Diagnosis present

## 2024-01-18 DIAGNOSIS — I5031 Acute diastolic (congestive) heart failure: Secondary | ICD-10-CM | POA: Diagnosis not present

## 2024-01-18 DIAGNOSIS — Z9981 Dependence on supplemental oxygen: Secondary | ICD-10-CM | POA: Diagnosis not present

## 2024-01-18 DIAGNOSIS — F17290 Nicotine dependence, other tobacco product, uncomplicated: Secondary | ICD-10-CM | POA: Diagnosis present

## 2024-01-18 DIAGNOSIS — N949 Unspecified condition associated with female genital organs and menstrual cycle: Secondary | ICD-10-CM | POA: Diagnosis not present

## 2024-01-18 DIAGNOSIS — I2721 Secondary pulmonary arterial hypertension: Secondary | ICD-10-CM | POA: Diagnosis present

## 2024-01-18 DIAGNOSIS — Z720 Tobacco use: Secondary | ICD-10-CM | POA: Diagnosis not present

## 2024-01-18 DIAGNOSIS — N83201 Unspecified ovarian cyst, right side: Secondary | ICD-10-CM | POA: Diagnosis present

## 2024-01-18 DIAGNOSIS — N3000 Acute cystitis without hematuria: Secondary | ICD-10-CM | POA: Diagnosis not present

## 2024-01-18 DIAGNOSIS — N281 Cyst of kidney, acquired: Secondary | ICD-10-CM | POA: Diagnosis present

## 2024-01-18 DIAGNOSIS — K219 Gastro-esophageal reflux disease without esophagitis: Secondary | ICD-10-CM | POA: Diagnosis present

## 2024-01-18 DIAGNOSIS — E876 Hypokalemia: Secondary | ICD-10-CM | POA: Diagnosis present

## 2024-01-18 DIAGNOSIS — I5032 Chronic diastolic (congestive) heart failure: Secondary | ICD-10-CM | POA: Diagnosis not present

## 2024-01-18 DIAGNOSIS — I959 Hypotension, unspecified: Secondary | ICD-10-CM | POA: Diagnosis not present

## 2024-01-18 DIAGNOSIS — N951 Menopausal and female climacteric states: Secondary | ICD-10-CM | POA: Diagnosis present

## 2024-01-18 DIAGNOSIS — G43909 Migraine, unspecified, not intractable, without status migrainosus: Secondary | ICD-10-CM | POA: Diagnosis not present

## 2024-01-18 DIAGNOSIS — D649 Anemia, unspecified: Secondary | ICD-10-CM | POA: Diagnosis present

## 2024-01-18 DIAGNOSIS — Z79899 Other long term (current) drug therapy: Secondary | ICD-10-CM | POA: Diagnosis not present

## 2024-01-18 DIAGNOSIS — N12 Tubulo-interstitial nephritis, not specified as acute or chronic: Secondary | ICD-10-CM | POA: Diagnosis present

## 2024-01-18 DIAGNOSIS — M461 Sacroiliitis, not elsewhere classified: Secondary | ICD-10-CM | POA: Diagnosis present

## 2024-01-18 DIAGNOSIS — R1031 Right lower quadrant pain: Secondary | ICD-10-CM | POA: Diagnosis not present

## 2024-01-18 DIAGNOSIS — I110 Hypertensive heart disease with heart failure: Secondary | ICD-10-CM | POA: Diagnosis present

## 2024-01-18 DIAGNOSIS — F418 Other specified anxiety disorders: Secondary | ICD-10-CM | POA: Diagnosis present

## 2024-01-18 DIAGNOSIS — K5900 Constipation, unspecified: Secondary | ICD-10-CM | POA: Diagnosis present

## 2024-01-18 DIAGNOSIS — Z79890 Hormone replacement therapy (postmenopausal): Secondary | ICD-10-CM | POA: Diagnosis not present

## 2024-01-18 DIAGNOSIS — E871 Hypo-osmolality and hyponatremia: Secondary | ICD-10-CM | POA: Diagnosis not present

## 2024-01-18 DIAGNOSIS — R109 Unspecified abdominal pain: Secondary | ICD-10-CM | POA: Diagnosis present

## 2024-01-18 DIAGNOSIS — I10 Essential (primary) hypertension: Secondary | ICD-10-CM

## 2024-01-18 DIAGNOSIS — K2980 Duodenitis without bleeding: Secondary | ICD-10-CM | POA: Diagnosis present

## 2024-01-18 DIAGNOSIS — I5033 Acute on chronic diastolic (congestive) heart failure: Secondary | ICD-10-CM | POA: Diagnosis not present

## 2024-01-18 DIAGNOSIS — G44209 Tension-type headache, unspecified, not intractable: Secondary | ICD-10-CM | POA: Diagnosis not present

## 2024-01-18 LAB — BASIC METABOLIC PANEL WITH GFR
Anion gap: 4 — ABNORMAL LOW (ref 5–15)
BUN: 9 mg/dL (ref 6–20)
CO2: 22 mmol/L (ref 22–32)
Calcium: 8.3 mg/dL — ABNORMAL LOW (ref 8.9–10.3)
Chloride: 107 mmol/L (ref 98–111)
Creatinine, Ser: 0.6 mg/dL (ref 0.44–1.00)
GFR, Estimated: >60 mL/min (ref >60)
Glucose, Bld: 98 mg/dL (ref 70–99)
Potassium: 3.8 mmol/L (ref 3.5–5.1)
Sodium: 133 mmol/L — ABNORMAL LOW (ref 135–145)

## 2024-01-18 LAB — RESP PANEL BY RT-PCR (RSV, FLU A&B, COVID)  RVPGX2
Influenza A by PCR: NEGATIVE
Influenza B by PCR: NEGATIVE
Resp Syncytial Virus by PCR: NEGATIVE
SARS Coronavirus 2 by RT PCR: NEGATIVE

## 2024-01-18 LAB — TYPE AND SCREEN
ABO/RH(D): A POS
Antibody Screen: NEGATIVE

## 2024-01-18 LAB — CBC WITH DIFFERENTIAL/PLATELET
Abs Immature Granulocytes: 0.04 10*3/uL (ref 0.00–0.07)
Basophils Absolute: 0 10*3/uL (ref 0.0–0.1)
Basophils Relative: 0 %
Eosinophils Absolute: 0 10*3/uL (ref 0.0–0.5)
Eosinophils Relative: 1 %
HCT: 28.7 % — ABNORMAL LOW (ref 36.0–46.0)
Hemoglobin: 9.7 g/dL — ABNORMAL LOW (ref 12.0–15.0)
Immature Granulocytes: 1 %
Lymphocytes Relative: 16 %
Lymphs Abs: 0.7 10*3/uL (ref 0.7–4.0)
MCH: 33.3 pg (ref 26.0–34.0)
MCHC: 33.8 g/dL (ref 30.0–36.0)
MCV: 98.6 fL (ref 80.0–100.0)
Monocytes Absolute: 1 10*3/uL (ref 0.1–1.0)
Monocytes Relative: 24 %
Neutro Abs: 2.3 10*3/uL (ref 1.7–7.7)
Neutrophils Relative %: 58 %
Platelets: 113 10*3/uL — ABNORMAL LOW (ref 150–400)
RBC: 2.91 MIL/uL — ABNORMAL LOW (ref 3.87–5.11)
RDW: 13.6 % (ref 11.5–15.5)
WBC: 4.1 10*3/uL (ref 4.0–10.5)
nRBC: 0 % (ref 0.0–0.2)

## 2024-01-18 LAB — CBC
HCT: 28.4 % — ABNORMAL LOW (ref 36.0–46.0)
Hemoglobin: 9.5 g/dL — ABNORMAL LOW (ref 12.0–15.0)
MCH: 32.4 pg (ref 26.0–34.0)
MCHC: 33.5 g/dL (ref 30.0–36.0)
MCV: 96.9 fL (ref 80.0–100.0)
Platelets: 128 10*3/uL — ABNORMAL LOW (ref 150–400)
RBC: 2.93 MIL/uL — ABNORMAL LOW (ref 3.87–5.11)
RDW: 13.7 % (ref 11.5–15.5)
WBC: 4.6 10*3/uL (ref 4.0–10.5)
nRBC: 0 % (ref 0.0–0.2)

## 2024-01-18 LAB — HIV ANTIBODY (ROUTINE TESTING W REFLEX): HIV Screen 4th Generation wRfx: NONREACTIVE

## 2024-01-18 LAB — VITAMIN B12: Vitamin B-12: 1066 pg/mL — ABNORMAL HIGH (ref 180–914)

## 2024-01-18 MED ORDER — SALINE SPRAY 0.65 % NA SOLN: 1.0000 | NASAL | Status: DC | PRN

## 2024-01-18 MED ORDER — FLUTICASONE PROPIONATE 50 MCG/ACT NA SUSP
1.0000 | Freq: Every day | NASAL | Status: DC
  Administered 2024-01-22 – 2024-01-26 (×3): 1 via NASAL
  Filled 2024-01-18: qty 16

## 2024-01-18 MED ORDER — BUTALBITAL-APAP-CAFFEINE 50-325-40 MG PO TABS
2.0000 | ORAL_TABLET | ORAL | Status: AC
  Administered 2024-01-18: 2 via ORAL
  Filled 2024-01-18: qty 2

## 2024-01-18 MED ORDER — POTASSIUM & SODIUM PHOSPHATES 280-160-250 MG PO PACK
2.0000 | PACK | ORAL | Status: AC
  Administered 2024-01-18 (×3): 2 via ORAL
  Filled 2024-01-18 (×3): qty 2

## 2024-01-18 MED ORDER — NAPHAZOLINE-GLYCERIN 0.012-0.25 % OP SOLN: 1.0000 [drp] | Freq: Four times a day (QID) | OPHTHALMIC | Status: DC | PRN

## 2024-01-18 MED ORDER — DILTIAZEM HCL 60 MG PO TABS
120.0000 mg | ORAL_TABLET | Freq: Two times a day (BID) | ORAL | Status: DC
  Administered 2024-01-18 – 2024-01-19 (×2): 120 mg via ORAL
  Filled 2024-01-18 (×3): qty 2

## 2024-01-18 MED ORDER — TRIAMCINOLONE ACETONIDE 0.5 % EX CREA
TOPICAL_CREAM | Freq: Three times a day (TID) | CUTANEOUS | Status: DC
Start: 2024-01-18 — End: 2024-01-20
  Filled 2024-01-18: qty 15

## 2024-01-18 MED ORDER — LORATADINE 10 MG PO TABS
10.0000 mg | ORAL_TABLET | Freq: Every day | ORAL | Status: AC
  Administered 2024-01-19 – 2024-01-20 (×2): 10 mg via ORAL
  Filled 2024-01-18 (×3): qty 1

## 2024-01-18 MED ORDER — SODIUM CHLORIDE 0.9 % IV SOLN
1.0000 g | Freq: Two times a day (BID) | INTRAVENOUS | Status: DC
  Administered 2024-01-18 – 2024-01-19 (×3): 1 g via INTRAVENOUS
  Filled 2024-01-18 (×4): qty 1

## 2024-01-18 NOTE — Consult Note (Addendum)
 Consult History and Physical   SERVICE: Gynecology   Patient Name: Rachel Wall  Patient MRN:   [id number]   CC: Kortne All is a 43yo [id number] who presents to the ED for abdominal pain and distention.  Per imaging she has a 4.5 cm hemorrhagic cyst on her right ovary.  She is status post a robotically assisted total laparoscopic hysterectomy with salpingectomy on 05/12/2019 with Dr. Jeani Mill.  She had a pacemaker pocket relocation in 08/2022.  She also has a history of gastric bypass and cholecystectomy.  She is on estrogen for menopausal symptoms.  She states history of low back pain about 6 days ago, that has worsened every day until she was unable to work on Thursday.  Her pain started right lower back and migrated around her right flank.  It is now worse in her right lower quadrant down her right thigh and into her right groin fold.  She has trouble using her muscles to stand up because of the pain.  She also has noticed increased bloating that got worse on Thursday and Friday until she feels like she is "5 months pregnant".  No constipation or nausea.  She also is retaining fluid, and feels like she has gained approximately 20 pounds in the last couple of weeks.  She has had to remove her wedding rings because her hands are swollen and she notices it in her face as well.  She is not having any shortness of breath, trouble with her bowels or bladder.  She has a history of kidney stones and a right CVA tenderness is greater than left CVA tenderness.  Review of Systems: positives in bold GEN:   fevers, chills, weight changes, appetite changes, fatigue, night sweats HEENT:  HA, vision changes, hearing loss, congestion, rhinorrhea, sinus pressure, dysphagia CV:   CP, palpitations PULM:  SOB, cough GI:  abd pain, N/V/D/C GU:  dysuria, urgency, frequency MSK:  arthralgias, myalgias, back pain, swelling SKIN:  rashes, color changes, pallor NEURO:  numbness, weakness, tingling,  seizures, dizziness, tremors PSYCH:  depression, anxiety, behavioral problems, confusion  HEME/LYMPH:  easy bruising or bleeding ENDO:  heat/cold intolerance  Past Obstetrical History: OB History     Gravida  5   Para  3   Term  3   Preterm      AB  2   Living  3      SAB  2   IAB      Ectopic      Multiple      Live Births  3           Past Gynecologic History: Patient's last menstrual period was 04/14/2019 (exact date).   Past Medical History: Past Medical History:  Diagnosis Date   Anxiety    Arthritis    Bradycardia    has pacemaker   CHF (congestive heart failure) (HCC)    Depression    Dyspnea    Dysrhythmia    Family history of breast cancer    neg cancer genetic testing per pt (no results in chart)   Family history of ovarian cancer    GERD (gastroesophageal reflux disease)    Headache    Migraines   History of kidney stones    Hypertension    On home oxygen therapy    4 l at night- Oxygen sat - 80's   Presence of permanent cardiac pacemaker    Pulmonary hypertension (HCC)    Tachycardia     Past  Surgical History: Past Surgical History:  Procedure Laterality Date   ABDOMINAL HYSTERECTOMY     BIOPSY  10/20/2020   Procedure: BIOPSY;  Surgeon: Brice Campi Albino Alu., MD;  Location: [facility] ENDOSCOPY;  Service: Gastroenterology;;   COLONOSCOPY WITH PROPOFOL  N/A 08/04/2020   Procedure: COLONOSCOPY WITH PROPOFOL ;  Surgeon: Luke Salaam, MD;  Location: [facility] ENDOSCOPY;  Service: Gastroenterology;  Laterality: N/A;   COLONOSCOPY WITH PROPOFOL  N/A 08/11/2020   Procedure: COLONOSCOPY WITH PROPOFOL ;  Surgeon: Luke Salaam, MD;  Location: [facility] ENDOSCOPY;  Service: Gastroenterology;  Laterality: N/A;   COLONOSCOPY WITH PROPOFOL  N/A 10/20/2020   Procedure: COLONOSCOPY WITH PROPOFOL ;  Surgeon: Mansouraty, Albino Alu., MD;  Location: [facility] ENDOSCOPY;  Service: Gastroenterology;  Laterality: N/A;   CYSTOSCOPY  05/12/2019   Procedure: CYSTOSCOPY;  Surgeon:  Kris Pester, MD;  Location: [facility] ORS;  Service: Gynecology;;   CYSTOSCOPY     ENDOSCOPIC MUCOSAL RESECTION N/A 10/20/2020   Procedure: ENDOSCOPIC MUCOSAL RESECTION;  Surgeon: Normie Becton., MD;  Location: [facility] ENDOSCOPY;  Service: Gastroenterology;  Laterality: N/A;   ESOPHAGOGASTRODUODENOSCOPY (EGD) WITH PROPOFOL  N/A 10/20/2020   Procedure: ESOPHAGOGASTRODUODENOSCOPY (EGD) WITH PROPOFOL ;  Surgeon: Brice Campi Albino Alu., MD;  Location: [facility] ENDOSCOPY;  Service: Gastroenterology;  Laterality: N/A;   ESOPHAGOGASTRODUODENOSCOPY (EGD) WITH PROPOFOL  N/A 02/07/2021   Procedure: ESOPHAGOGASTRODUODENOSCOPY (EGD) WITH PROPOFOL ;  Surgeon: Luke Salaam, MD;  Location: [facility] ENDOSCOPY;  Service: Gastroenterology;  Laterality: N/A;   GASTRIC BYPASS     heart monitor implant and removal     HEMOSTASIS CLIP PLACEMENT  10/20/2020   Procedure: HEMOSTASIS CLIP PLACEMENT;  Surgeon: Normie Becton., MD;  Location: [facility] ENDOSCOPY;  Service: Gastroenterology;;   HOT HEMOSTASIS N/A 10/20/2020   Procedure: HOT HEMOSTASIS (ARGON PLASMA COAGULATION/BICAP);  Surgeon: Normie Becton., MD;  Location: [facility] ENDOSCOPY;  Service: Gastroenterology;  Laterality: N/A;   INSERT / REPLACE / REMOVE PACEMAKER     LITHOTRIPSY  2012   PACEMAKER INSERTION  2013   POLYPECTOMY  10/20/2020   Procedure: POLYPECTOMY;  Surgeon: Brice Campi Albino Alu., MD;  Location: [facility] ENDOSCOPY;  Service: Gastroenterology;;   Y[place]onne Herald REMOVAL     TOTAL LAPAROSCOPIC HYSTERECTOMY WITH SALPINGECTOMY Bilateral 05/12/2019   Procedure: TOTAL LAPAROSCOPIC HYSTERECTOMY;  Surgeon: Kris Pester, MD;  Location: [facility] ORS;  Service: Gynecology;  Laterality: Bilateral;   TUBAL LIGATION  10/10/2011    Family History:  family history includes Brain cancer in her paternal uncle; Breast cancer in her paternal aunt; Colon cancer (age of onset: 68) in her sister; Colon cancer (age of onset: 42) in her paternal grandfather; Diabetes in her father;  Healthy in her mother; Heart disease in her father; Hypertension in her father; Kidney cancer (age of onset: 83) in her father; Lung disease in her father; Ovarian cancer in her paternal aunt; Stomach cancer in her paternal aunt.   Social History:  Social History   Socioeconomic History   Marital status: Married    Spouse name: Not on file   Number of children: Not on file   Years of education: Not on file   Highest education level: Not on file  Occupational History   Not on file  Tobacco Use   Smoking status: Former    Current packs/day: 0.00    Types: Cigarettes    Quit date: 07/2020    Years since quitting: 3.5   Smokeless tobacco: Never  Vaping Use   Vaping status: Every Day   Substances: Nicotine   Substance and Sexual Activity   Alcohol use: Not Currently  Drug use: Not Currently   Sexual activity: Yes    Birth control/protection: Surgical    Comment: Tubal ligation/Hysterectomy  Other Topics Concern   Not on file  Social History Narrative   Not on file   Social Drivers of Health   Financial Resource Strain: Not on file  Food Insecurity: No Food Insecurity (01/17/2024)   Hunger Vital Sign    Worried About Running Out of Food in the Last Year: Never true    Ran Out of Food in the Last Year: Never true  Transportation Needs: No Transportation Needs (01/17/2024)   PRAPARE - Administrator, Civil Service (Medical): No    Lack of Transportation (Non-Medical): No  Physical Activity: Not on file  Stress: Not on file  Social Connections: Not on file  Intimate Partner Violence: Not At Risk (01/17/2024)   Humiliation, Afraid, Rape, and Kick questionnaire    Fear of Current or Ex-Partner: No    Emotionally Abused: No    Physically Abused: No    Sexually Abused: No    Home Medications:  Medications reconciled in EPIC  No current facility-administered medications on file prior to encounter.   Current Outpatient Medications on File Prior to Encounter   Medication Sig Dispense Refill   ambrisentan  (LETAIRIS ) 10 MG tablet Take 10 mg by mouth daily.      citalopram  (CELEXA ) 40 MG tablet Take 40 mg by mouth daily.     diltiazem  (CARDIZEM ) 120 MG tablet Take 120-240 mg by mouth See admin instructions. Take 120 mg by mouth in the morning and take 240 mg by mouth at bedtime     estradiol  (ESTRACE ) 2 MG tablet Take 1 tablet (2 mg total) by mouth daily. 90 tablet 5   furosemide  (LASIX ) 40 MG tablet Take 40 mg by mouth 2 (two) times daily.     potassium chloride  SA (KLOR-CON  M) 20 MEQ tablet Take 20 mEq by mouth daily.     Selexipag  1200 MCG TABS Take 1,200 mcg by mouth 2 (two) times daily.      spironolactone  (ALDACTONE ) 50 MG tablet Take 50 mg by mouth daily.     tadalafil , PAH, (ADCIRCA ) 20 MG tablet Take 40 mg by mouth daily.     traZODone  (DESYREL ) 50 MG tablet TAKE 3 TABLETS(150 MG) BY MOUTH AT BEDTIME AS NEEDED FOR SLEEP 90 tablet 0    Allergies:  Allergies  Allergen Reactions   Cyclosporine Other (See Comments)    Medication interactions   Nitrates, Organic     Can't take w/ other meds   Nitroglycerin Other (See Comments)    Medication interactions Medication interactions Medication interactions-Lung meds taken by pt. No Nitrates please   Tizanidine     Interaction with heart and lung medications Interaction with heart and lung medications   Nsaids     Reason: Gastric bypass   Other Other (See Comments)    Muscle relaxers - per patient interacts with other medications she is on.  Gastric bypass   Adhesive [Tape] Rash    Physical Exam:  Temp:  [97 F (36.1 C)-100.1 F (37.8 C)] 98.3 F (36.8 C) (04/19 1136) Pulse Rate:  [82-121] 86 (04/19 1136) Resp:  [16-20] 17 (04/19 1136) BP: (91-125)/(55-77) 91/57 (04/19 1136) SpO2:  [92 %-100 %] 95 % (04/19 1136) Weight:  [34.6 kg] 34.6 kg (04/18 2100)   General Appearance:  Well developed, well nourished, no acute distress, alert and oriented x3 HEENT:  Normocephalic atraumatic,  extraocular movements intact,  moist mucous membranes  Abdomen: Softly distended, no peritoneal signs, Rovsing's negative.  No tenderness in McBurney's point specifically, but she does have Carnett's sign maneuver positive at this area.  The pain is diffuse and radiates from her superior margin point fold across her right thigh and across her right lower quadrant.  It radiates into her flank and into her lower back.  Soft,no abnormal masses, no epigastric pain  Psychiatric:  Normal mood and affect, appropriate, no AH/VH Pelvic:  deferred   Labs/Studies:  Results for orders placed or performed during the hospital encounter of 01/17/24 (from the past 72 hours)  Urinalysis, Routine w reflex microscopic -Urine, Clean Catch     Status: Abnormal   Collection Time: 01/17/24 12:39 PM  Result Value Ref Range   Color, Urine Marlea (A) YELLOW    Comment: BIOCHEMICALS MAY BE AFFECTED BY COLOR   APPearance HAZY (A) CLEAR   Specific Gravity, Urine 1.015 1.005 - 1.030   pH 5.0 5.0 - 8.0   Glucose, UA NEGATIVE NEGATIVE mg/dL   Hgb urine dipstick NEGATIVE NEGATIVE   Bilirubin Urine NEGATIVE NEGATIVE   Ketones, ur NEGATIVE NEGATIVE mg/dL   Protein, ur NEGATIVE NEGATIVE mg/dL   Nitrite POSITIVE (A) NEGATIVE   Leukocytes,Ua TRACE (A) NEGATIVE   RBC / HPF 0-5 0 - 5 RBC/hpf   WBC, UA 21-50 0 - 5 WBC/hpf   Bacteria, UA FEW (A) NONE SEEN   Squamous Epithelial / HPF 0-5 0 - 5 /HPF   Mucus PRESENT     Comment: Performed at [facility], [address] [place], [place] [address]  Brain natriuretic peptide     Status: None   Collection Time: 01/17/24 12:57 PM  Result Value Ref Range   B Natriuretic Peptide 90.6 0.0 - 100.0 pg/mL    Comment: Performed at [facility], [address] [place], [place] [address]  Lipase, blood     Status: None   Collection Time: 01/17/24 12:58 PM  Result Value Ref Range   Lipase 28 11 - 51 U/L    Comment: Performed at [facility], [address] [place], [place] [address]  Comprehensive metabolic panel     Status: Abnormal   Collection Time: 01/17/24 12:58 PM  Result Value Ref Range   Sodium 135 135 - 145 mmol/L   Potassium 3.2 (L) 3.5 - 5.1 mmol/L   Chloride 112 (H) 98 - 111 mmol/L   CO2 19 (L) 22 - 32 mmol/L   Glucose, Bld 105 (H) 70 - 99 mg/dL    Comment: Glucose reference range applies only to samples taken after fasting for at least 8 hours.   BUN 11 6 - 20 mg/dL   Creatinine, Ser 9.52 0.44 - 1.00 mg/dL   Calcium 8.7 (L) 8.9 - 10.3 mg/dL   Total Protein 5.7 (L) 6.5 - 8.1 g/dL   Albumin 3.1 (L) 3.5 - 5.0 g/dL   AST 22 15 - 41 U/L   ALT 27 0 - 44 U/L   Alkaline Phosphatase 93 38 - 126 U/L   Total Bilirubin 0.6 0.0 - 1.2 mg/dL   GFR, Estimated >84 >13 mL/min    Comment: (NOTE) Calculated using the CKD-EPI Creatinine Equation (2021)    Anion gap 4 (L) 5 - 15    Comment: Performed at [facility], [address], [place], [place] [address]  CBC     Status: Abnormal   Collection Time: 01/17/24 12:58 PM  Result Value Ref Range  WBC 4.4 4.0 - 10.5 K/uL   RBC 3.21 (L) 3.87 - 5.11 MIL/uL   Hemoglobin 10.8 (L) 12.0 - 15.0 g/dL   HCT 16.1 (L) 09.6 - 04.5 %   MCV 100.0 80.0 - 100.0 fL   MCH 33.6 26.0 - 34.0 pg   MCHC 33.6 30.0 - 36.0 g/dL   RDW 40.9 81.1 - 91.4 %   Platelets 132 (L) 150 - 400 K/uL   nRBC 0.0 0.0 - 0.2 %    Comment: Performed at [facility], [address] [place], [place] [address]  Lactic acid, plasma     Status: None   Collection Time: 01/17/24  6:38 PM  Result Value Ref Range   Lactic Acid, Venous 1.0 0.5 - 1.9 mmol/L    Comment: Performed at [facility], [address] [place], [place] [address]  Protime-INR     Status: None   Collection Time: 01/17/24  9:30 PM  Result Value Ref Range   Prothrombin Time 15.0 11.4 - 15.2 seconds   INR 1.2 0.8 - 1.2    Comment: (NOTE) INR goal varies based on device and disease states. Performed at [facility],  [address] [place], [place] [address]   APTT     Status: None   Collection Time: 01/17/24  9:30 PM  Result Value Ref Range   aPTT 33 24 - 36 seconds    Comment: Performed at [facility], [address] [place], [place] [address]  Magnesium      Status: None   Collection Time: 01/17/24  9:30 PM  Result Value Ref Range   Magnesium  1.9 1.7 - 2.4 mg/dL    Comment: Performed at [facility], [address] [place], [place] [address]  Phosphorus     Status: Abnormal   Collection Time: 01/17/24  9:30 PM  Result Value Ref Range   Phosphorus 1.9 (L) 2.5 - 4.6 mg/dL    Comment: Performed at [facility], [address] [place], [place] [address]  HIV Antibody (routine testing w rflx)     Status: None   Collection Time: 01/17/24  9:30 PM  Result Value Ref Range   HIV Screen 4th Generation wRfx Non Reactive Non Reactive    Comment: Performed at [facility], [address] [place], [place] [address]  Vitamin B12     Status: Abnormal   Collection Time: 01/17/24  9:30 PM  Result Value Ref Range   Vitamin B-12 1,066 (H) 180 - 914 pg/mL    Comment: (NOTE) This assay is not validated for testing neonatal or myeloproliferative syndrome specimens for Vitamin B12 levels. Performed at [facility], [address], [place], [place] [address]   Folate     Status: None   Collection Time: 01/17/24  9:30 PM  Result Value Ref Range   Folate 20.6 >5.9 ng/mL    Comment: Performed at [facility], [address] [place], [place] [address]  Iron and TIBC     Status: Abnormal   Collection Time: 01/17/24  9:30 PM  Result Value Ref Range   Iron 28 28 - 170 ug/dL   TIBC 742 595 - 638 ug/dL   Saturation Ratios 7 (L) 10.4 - 31.8 %   UIBC 363 ug/dL    Comment: Performed at [facility], [address], [place], [place] [address]  Ferritin     Status: None   Collection Time: 01/17/24  9:30 PM  Result  Value Ref Range   Ferritin 39 11 -  307 ng/mL    Comment: Performed at [facility], [address] [place], [place] [address]  Reticulocytes     Status: Abnormal   Collection Time: 01/17/24  9:30 PM  Result Value Ref Range   Retic Ct Pct 3.0 0.4 - 3.1 %   RBC. 3.37 (L) 3.87 - 5.11 MIL/uL   Retic Count, Absolute 101.1 19.0 - 186.0 K/uL   Immature Retic Fract 21.3 (H) 2.3 - 15.9 %    Comment: Performed at [facility], [address], [place], [place] [address]  Hepatic function panel     Status: Abnormal   Collection Time: 01/17/24  9:30 PM  Result Value Ref Range   Total Protein 5.8 (L) 6.5 - 8.1 g/dL   Albumin 3.1 (L) 3.5 - 5.0 g/dL   AST 28 15 - 41 U/L   ALT 31 0 - 44 U/L   Alkaline Phosphatase 117 38 - 126 U/L   Total Bilirubin 0.4 0.0 - 1.2 mg/dL   Bilirubin, Direct 0.2 0.0 - 0.2 mg/dL   Indirect Bilirubin 0.2 (L) 0.3 - 0.9 mg/dL    Comment: Performed at [facility], [address] [place], [place] [address]  Culture, blood (Routine X 2) w Reflex to ID Panel     Status: None (Preliminary result)   Collection Time: 01/17/24 10:29 PM   Specimen: BLOOD  Result Value Ref Range   Specimen Description BLOOD BLOOD LEFT HAND    Special Requests      BOTTLES DRAWN AEROBIC AND ANAEROBIC Blood Culture adequate volume   Culture      NO GROWTH < 12 HOURS Performed at [facility], [address] [place], [place] [address]    Report Status PENDING   Culture, blood (Routine X 2) w Reflex to ID Panel     Status: None (Preliminary result)   Collection Time: 01/17/24 10:33 PM   Specimen: BLOOD  Result Value Ref Range   Specimen Description BLOOD BLOOD RIGHT ARM    Special Requests      BOTTLES DRAWN AEROBIC AND ANAEROBIC Blood Culture adequate volume   Culture      NO GROWTH < 12 HOURS Performed at [facility], [address] [place], [place] [address]    Report Status PENDING   Basic metabolic panel     Status: Abnormal   Collection Time: 01/18/24  3:17 AM   Result Value Ref Range   Sodium 133 (L) 135 - 145 mmol/L   Potassium 3.8 3.5 - 5.1 mmol/L   Chloride 107 98 - 111 mmol/L   CO2 22 22 - 32 mmol/L   Glucose, Bld 98 70 - 99 mg/dL    Comment: Glucose reference range applies only to samples taken after fasting for at least 8 hours.   BUN 9 6 - 20 mg/dL   Creatinine, Ser 1.30 0.44 - 1.00 mg/dL   Calcium 8.3 (L) 8.9 - 10.3 mg/dL   GFR, Estimated >86 >57 mL/min    Comment: (NOTE) Calculated using the CKD-EPI Creatinine Equation (2021)    Anion gap 4 (L) 5 - 15    Comment: Performed at [facility], [address] [place], [place] [address]  Type and screen [facility]     Status: None   Collection Time: 01/18/24  3:17 AM  Result Value Ref Range   ABO/RH(D) A POS    Antibody Screen NEG  Sample Expiration      01/21/2024,2359 Performed at [facility], [address] [place], [place] [address]   CBC     Status: Abnormal   Collection Time: 01/18/24  8:58 AM  Result Value Ref Range   WBC 4.6 4.0 - 10.5 K/uL   RBC 2.93 (L) 3.87 - 5.11 MIL/uL   Hemoglobin 9.5 (L) 12.0 - 15.0 g/dL   HCT 19.1 (L) 47.8 - 29.5 %   MCV 96.9 80.0 - 100.0 fL   MCH 32.4 26.0 - 34.0 pg   MCHC 33.5 30.0 - 36.0 g/dL   RDW 62.1 30.8 - 65.7 %   Platelets 128 (L) 150 - 400 K/uL   nRBC 0.0 0.0 - 0.2 %    Comment: Performed at [facility], [address], [place], [place] [address]  Resp panel by RT-PCR (RSV, Flu A&B, Covid) Anterior Nasal Swab     Status: None   Collection Time: 01/18/24 12:29 PM   Specimen: Anterior Nasal Swab  Result Value Ref Range   SARS Coronavirus 2 by RT PCR NEGATIVE NEGATIVE    Comment: (NOTE) SARS-CoV-2 target nucleic acids are NOT DETECTED.  The SARS-CoV-2 RNA is generally detectable in upper respiratory specimens during the acute phase of infection. The lowest concentration of SARS-CoV-2 viral copies this assay can detect is 138 copies/mL. A negative result does not preclude  SARS-Cov-2 infection and should not be used as the sole basis for treatment or other patient management decisions. A negative result may occur with  improper specimen collection/handling, submission of specimen other than nasopharyngeal swab, presence of viral mutation(s) within the areas targeted by this assay, and inadequate number of viral copies(<138 copies/mL). A negative result must be combined with clinical observations, patient history, and epidemiological information. The expected result is Negative.  Fact Sheet for Patients:  BloggerCourse.com  Fact Sheet for Healthcare Providers:  SeriousBroker.it  This test is no t yet approved or cleared by the [place]  FDA and  has been authorized for detection and/or diagnosis of SARS-CoV-2 by FDA under an Emergency Use Authorization (EUA). This EUA will remain  in effect (meaning this test can be used) for the duration of the COVID-19 declaration under Section 564(b)(1) of the Act, 21 U.S.C.section 360bbb-3(b)(1), unless the authorization is terminated  or revoked sooner.       Influenza A by PCR NEGATIVE NEGATIVE   Influenza B by PCR NEGATIVE NEGATIVE    Comment: (NOTE) The Xpert Xpress SARS-CoV-2/FLU/RSV plus assay is intended as an aid in the diagnosis of influenza from Nasopharyngeal swab specimens and should not be used as a sole basis for treatment. Nasal washings and aspirates are unacceptable for Xpert Xpress SARS-CoV-2/FLU/RSV testing.  Fact Sheet for Patients: BloggerCourse.com  Fact Sheet for Healthcare Providers: SeriousBroker.it  This test is not yet approved or cleared by the [place]  FDA and has been authorized for detection and/or diagnosis of SARS-CoV-2 by FDA under an Emergency Use Authorization (EUA). This EUA will remain in effect (meaning this test can be used) for the duration of the COVID-19  declaration under Section 564(b)(1) of the Act, 21 U.S.C. section 360bbb-3(b)(1), unless the authorization is terminated or revoked.     Resp Syncytial Virus by PCR NEGATIVE NEGATIVE    Comment: (NOTE) Fact Sheet for Patients: BloggerCourse.com  Fact Sheet for Healthcare Providers: SeriousBroker.it  This test is not yet approved or cleared by the [place]  FDA and has been authorized for detection and/or diagnosis of SARS-CoV-2 by FDA  under an Emergency Use Authorization (EUA). This EUA will remain in effect (meaning this test can be used) for the duration of the COVID-19 declaration under Section 564(b)(1) of the Act, 21 U.S.C. section 360bbb-3(b)(1), unless the authorization is terminated or revoked.  Performed at [facility], [address] [place], [place] [address]   CBC with Differential/Platelet     Status: Abnormal   Collection Time: 01/18/24  1:44 PM  Result Value Ref Range   WBC 4.1 4.0 - 10.5 K/uL   RBC 2.91 (L) 3.87 - 5.11 MIL/uL   Hemoglobin 9.7 (L) 12.0 - 15.0 g/dL   HCT 56.3 (L) 87.5 - 64.3 %   MCV 98.6 80.0 - 100.0 fL   MCH 33.3 26.0 - 34.0 pg   MCHC 33.8 30.0 - 36.0 g/dL   RDW 32.9 51.8 - 84.1 %   Platelets 113 (L) 150 - 400 K/uL   nRBC 0.0 0.0 - 0.2 %   Neutrophils Relative % 58 %   Neutro Abs 2.3 1.7 - 7.7 K/uL   Lymphocytes Relative 16 %   Lymphs Abs 0.7 0.7 - 4.0 K/uL   Monocytes Relative 24 %   Monocytes Absolute 1.0 0.1 - 1.0 K/uL   Eosinophils Relative 1 %   Eosinophils Absolute 0.0 0.0 - 0.5 K/uL   Basophils Relative 0 %   Basophils Absolute 0.0 0.0 - 0.1 K/uL   Immature Granulocytes 1 %   Abs Immature Granulocytes 0.04 0.00 - 0.07 K/uL    Comment: Performed at [facility], [address] [place], [place] [address]     Other Imaging: US  PELVIC TRANSABD W/PELVIC DOPPLER Result Date: 01/18/2024 CLINICAL DATA:  Adnexal cyst seen on CT EXAM: TRANSABDOMINAL ULTRASOUND  OF PELVIS DOPPLER ULTRASOUND OF OVARIES TECHNIQUE: Transabdominal ultrasound examination of the pelvis was performed including evaluation of the uterus, ovaries, adnexal regions, and pelvic cul-de-sac. Color and duplex Doppler ultrasound was utilized to evaluate blood flow to the ovaries. COMPARISON:  CT abdomen pelvis 01/17/2024 FINDINGS: Uterus Hysterectomy. Right ovary Measurements: 5.6 x 4.0 x 5.3 cm. = volume: 62 mL. There is a 4.5 x 3.6 x 3.5 cm heterogenous cystic lesion with internal hyperechogenicity and reticulations suggestive of hemorrhagic cyst. No evidence of internal vascularity. Left ovary Measurements: 3.7 x 2.4 x 3.1 cm = volume: 14.3 mL. Normal appearance/no adnexal mass. Pulsed Doppler evaluation demonstrates normal low-resistance arterial and venous waveforms in both ovaries. Other: Small amount of free fluid in the right adnexa. IMPRESSION: 1. 4.5 cm hemorrhagic cyst in the right ovary. Consider follow-up ultrasound in 6-12 weeks to ensure resolution. 2. Small amount of free fluid in the right adnexa. Electronically Signed   By: Rozell Cornet M.D.   On: 01/18/2024 02:05   CT ABDOMEN PELVIS W CONTRAST Result Date: 01/17/2024 CLINICAL DATA:  Acute abdominal pain, bowel obstruction suspected. Swelling in abdomen. 20 pound weight gain in the past week. Gastric bypass in February of 2023. Lower right quadrant pain. EXAM: CT ABDOMEN AND PELVIS WITH CONTRAST TECHNIQUE: Multidetector CT imaging of the abdomen and pelvis was performed using the standard protocol following bolus administration of intravenous contrast. RADIATION DOSE REDUCTION: This exam was performed according to the departmental dose-optimization program which includes automated exposure control, adjustment of the mA and/or kV according to patient size and/or use of iterative reconstruction technique. CONTRAST:  OMNIPAQUE  IOHEXOL  300 MG/ML  SOLN COMPARISON:  CT 08/14/2023 FINDINGS: Lower chest: No acute abnormality.  Hepatobiliary: Periportal edema. Cholecystectomy. No biliary dilation. Pancreas: No pancreatic  ductal dilation. There is some fluid and stranding about the pancreatic head. However this appears centered about the descending duodenum. Spleen: Splenomegaly measuring 15.1 cm in craniocaudal dimension. Adrenals/Urinary Tract: Normal adrenal glands. 2.3 cm cystic lesion in the posterior left kidney is slightly denser than simple fluid density (Hounsfield units 34). Delayed right nephrogram. Urothelial thickening and hyperenhancement about the proximal right ureter and right renal pelvis. Mild right hydronephrosis. No evidence of obstructing stone. Bladder is unremarkable. Stomach/Bowel: Postoperative change of Roux-en-Y gastric bypass. Mild wall thickening about the descending duodenum with adjacent fluid and stranding. No organized fluid collection. No free intraperitoneal air. No evidence of obstruction. The appendix is normal. Vascular/Lymphatic: No significant vascular findings are present. No enlarged abdominal or pelvic lymph nodes. Reproductive: Hysterectomy. 4.5 cm right adnexal cyst with adjacent small volume free fluid in the pelvis. Other: Free fluid about the descending duodenum/pancreatic head extending inferiorly in the anterior pararenal space. Additional free fluid in the pelvis. No free intraperitoneal air. No drainable fluid collection. Musculoskeletal: No acute fracture.  Body wall edema. IMPRESSION: 1. Free fluid about the descending duodenum and pancreatic head. The fluid appears centered about the duodenum and is favored to represent duodenitis however groove pancreatitis could appear similarly. Correlate with lipase. No organized fluid collection. No free intraperitoneal air. 2. Delayed right nephrogram with urothelial thickening and hyperenhancement about the proximal right ureter and right renal pelvis. Mild right hydronephrosis. No evidence of obstructing stone. Findings are concerning for  ascending urinary tract infection. 3. 4.5 cm right adnexal cyst with adjacent small volume free fluid in the pelvis. In the setting of right lower quadrant abdominal pain this could be further evaluated with pelvic ultrasound. 4. 2.3 cm cystic lesion in the posterior left kidney is slightly denser than simple fluid density. Further evaluation with nonemergent renal protocol MRI with and without IV contrast is recommended. 5. Splenomegaly. 6. Periportal and body wall edema. Electronically Signed   By: Rozell Cornet M.D.   On: 01/17/2024 18:20     Assessment / Plan:   Shawneen B Millman is a 43 y.o. [id number] who presents with abdominal pain and distention.  Per imaging she has an intact 4.5 cm hemorrhagic cyst on her right ovary, but no blood in her pelvis and no signs of ovarian torsion or active peritoneal bleeding.  The patient states that she would like to have surgery to remove both of her ovaries.  However, I do not believe that the locus of her pain is her right ovary, and though bilateral oophorectomy is a reasonable step, I would want to get a CA125 and a repeat ultrasound to evaluate changes in the cyst volume prior to that.  She does not have a surgical abdomen at this time, and given her complex medical conditions, I would prefer to optimize her for surgical outcomes prior to going to the OR in a nonemergent situation, particularly with regard to her heart and lungs.  Thank you for letting us  be involved in this patient's care, and I will sign off for now from a GYN standpoint.  Please call with any questions!  Prescilla Brod 01/18/24  5:24 PM

## 2024-01-18 NOTE — Progress Notes (Signed)
 PROGRESS NOTE    Rachel Wall  ZOX:[id number] DOB: Jul 16, 1981 DOA: 01/17/2024 PCP: System, Provider Not In  Chief Complaint  Patient presents with   Abdominal Pain    Hospital Course:  Rachel Wall is 43 y.o. 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  Patient reports abdominal pain and abdominal distention for 2 days prior to arrival.  Labs in the ED reveal hemoglobin of 10.8, down from 13.8 in November 2024.  Blood pressure 94/66, vital signs otherwise unremarkable.  Pelvic ultrasound reveals 4.5 cm hemorrhagic cyst in the right ovary.  OB/GYN was consulted and recommended serial CBCs to ensure hemoglobin remained stable.  Subjective: On evaluation this morning patient is complaining of multiple problems including facial swelling, abdominal swelling, upper extremity edema, itchy eyes, clogged ears. She also endorses ongoing right lower quadrant pain that is very tender to palpation.  As well as a headache which she attributes to no caffeine  this morning given her n.p.o. status.   Objective: Vitals:   01/17/24 2134 01/17/24 2334 01/18/24 0509 01/18/24 0734  BP: 112/65 (!) 94/55 (!) 92/59 (!) 98/56  Pulse: (!) 121 91 86 93  Resp: 18  16 17   Temp: 100.1 F (37.8 C) (!) 97 F (36.1 C) 98.9 F (37.2 C) 98.4 F (36.9 C)  TempSrc:  Temporal  Oral  SpO2: 100% 99% 92% 92%  Weight:      Height:       No intake or output data in the 24 hours ending 01/18/24 0939 Filed Weights   01/17/24 1249 01/17/24 2100  Weight: 73 kg 34.6 kg    Examination: General exam: Appears calm and comfortable, NAD  Respiratory system: No work of breathing, symmetric chest wall expansion Cardiovascular system: S1 & S2 heard, RRR.  Gastrointestinal system: Some  abdominal bloating, very tender to palpation in right lower quadrant Neuro: Alert and oriented. No focal neurological deficits. Extremities: Symmetric, no peripheral edema, expected range of motion. Skin: No rashes, lesions Psychiatry: Demonstrates appropriate judgement and insight. Mood & affect appropriate for situation.   Assessment & Plan:  Principal Problem:   Abdominal pain Active Problems:   Duodenitis   Adnexal cyst   UTI (urinary tract infection)   Kidney cysts   Pulmonary arterial hypertension (HCC)   Chronic diastolic CHF (congestive heart failure) (HCC)   HTN (hypertension)   Normocytic anemia   Hypokalemia   Tobacco use   Depression with anxiety    Abdominal pain - Hemorrhagic cyst on right ovary.  May also be complicated by duodenitis - EDP discussed directly with Dr. Mauri Sous of surgery who reports no acute surgical intervention necessary - Continue with as needed pain control  Hemorrhagic right ovarian cyst - As seen on pelvic ultrasound - Continue CBC trending.  Hemoglobin is downtrending but stable. - Dr. Alvia Awkward of OB/GYN was consulted, appreciate recommendations.  - She is on Cefotan , n.p.o. for now.  We will lift n.p.o. if hemoglobin remains stable at 1400 recheck  Duodenitis - Some free fluid seen around descending duodenum/pancreatic head. - Continue with PPI twice daily  Possible UTI - Patient has abdominal pain, temperature 100.1 - CT with findings of delayed right nephrogram with urothelial thickening and hyperenhancement about the proximal right ureter and right renal pelvis concerning for ascending UTI. - Will follow urine cultures - Antibiotics  as above.  Initially started on Rocephin , changed to cefotan  given hemorrhagic ovarian cyst  Kidney cyst - Incidentally seen on CT.  Cannot perform MRI due to pacemaker - Needs outpatient follow-up for serial monitoring  Pulmonary arterial hypertension - Continue with home meds: Ambrisentan ,  tadalafil , selexipag   Chronic diastolic heart failure - Most recent echocardiogram: EF over 55%, with diastolic dysfunction.  Heart failure complicated by pulmonary arterial hypertension. - BNP is less than 100 the patient endorses feeling swollen all over and does have notable abdominal edema. - Lasix  and spironolactone  were held given low blood pressures.  Will resume when blood pressure can tolerate.   - Monitor volume status closely  Hypertension - Hold home meds given low blood pressures as above - Continue to monitor closely - Resume as needed and titrate  Normocytic anemia - Hemoglobin 10.8 on arrival, most recently 13.8 in November 2024. - Blood loss likely may be secondary to hemorrhagic ovarian cysts, though unlikely to be entire reason.   -Iron panel reveals low TSAT, may be some component of iron deficiency at play.  Last colonoscopy in 2022 with large polyp resected, also history of esophagitis seen on EGD.  No overt bleeding at length - Will continue to monitor closely. - Transfuse if hemoglobin falls below 7  Hypokalemia - Replace as needed  Tobacco abuse - Nicotine  patch  Depression with anxiety - Resume home meds  Splenomegaly - 15.1 cm in craniocaudal dimension.  Incidentally seen on CT.  Left kidney cyst - Incidentally seen on CT.  2.3 cm cystic lesion in posterior left kidney - Will require renal protocol MRI with and without contrast.  Can be performed nonurgently outpatient.  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  Monitor closely for electrolyte abnormalities.  Replace as needed  Headache - Patient reports this is likely secondary to caffeine  withdrawal as she has been NPO.  Will proceed with Fioricet  for now.  Will lift n.p.o. status if hemoglobin remains stable at 1400 recheck.  Upper respiratory symptoms - Ear pain, itchy eyes. - Respiratory viral panel negatuve - Flonase , saline spray, eyedrops as needed  DVT prophylaxis: SCDs for now   Code  Status: Full Code Disposition: Inpatient, still hospitalized for workup.  Will eventually discharge home with family s  Consultants:  General surgery Gynecology  Procedures:    Antimicrobials:  Anti-infectives (From admission, onward)    Start     Dose/Rate Route Frequency Ordered Stop   01/18/24 2000  cefTRIAXone  (ROCEPHIN ) 1 g in sodium chloride  0.9 % 100 mL IVPB  Status:  Discontinued        1 g 200 mL/hr over 30 Minutes Intravenous Every 24 hours 01/17/24 2029 01/18/24 0257   01/18/24 0800  cefoTEtan  (CEFOTAN ) 1 g in sodium chloride  0.9 % 100 mL IVPB        1 g 200 mL/hr over 30 Minutes Intravenous Every 12 hours 01/18/24 0257     01/17/24 1900  cefTRIAXone  (ROCEPHIN ) 1 g in sodium chloride  0.9 % 100 mL IVPB        1 g 200 mL/hr over 30 Minutes Intravenous  Once 01/17/24 1845 01/17/24 1959       Data Reviewed: I have personally reviewed following labs and imaging studies CBC: Recent Labs  Lab 01/17/24 1258 01/18/24 0858  WBC 4.4 4.6  HGB 10.8* 9.5*  HCT 32.1* 28.4*  MCV 100.0 96.9  PLT 132* 128*   Basic Metabolic Panel: Recent Labs  Lab 01/17/24 1258 01/17/24 2130 01/18/24 0317  NA 135  --  133*  K 3.2*  --  3.8  CL 112*  --  107  CO2 19*  --  22  GLUCOSE 105*  --  98  BUN 11  --  9  CREATININE 0.71  --  0.60  CALCIUM 8.7*  --  8.3*  MG  --  1.9  --   PHOS  --  1.9*  --    GFR: Estimated Creatinine Clearance: 49.5 mL/min (by C-G formula based on SCr of 0.6 mg/dL). Liver Function Tests: Recent Labs  Lab 01/17/24 1258 01/17/24 2130  AST 22 28  ALT 27 31  ALKPHOS 93 117  BILITOT 0.6 0.4  PROT 5.7* 5.8*  ALBUMIN 3.1* 3.1*   CBG: No results for input(s): "GLUCAP" in the last 168 hours.  Recent Results (from the past 240 hours)  Culture, blood (Routine X 2) w Reflex to ID Panel     Status: None (Preliminary result)   Collection Time: 01/17/24 10:29 PM   Specimen: BLOOD  Result Value Ref Range Status   Specimen Description BLOOD BLOOD LEFT  HAND  Final   Special Requests   Final    BOTTLES DRAWN AEROBIC AND ANAEROBIC Blood Culture adequate volume   Culture   Final    NO GROWTH < 12 HOURS Performed at [facility], [address] [place], [place] [address]    Report Status PENDING  Incomplete  Culture, blood (Routine X 2) w Reflex to ID Panel     Status: None (Preliminary result)   Collection Time: 01/17/24 10:33 PM   Specimen: BLOOD  Result Value Ref Range Status   Specimen Description BLOOD BLOOD RIGHT ARM  Final   Special Requests   Final    BOTTLES DRAWN AEROBIC AND ANAEROBIC Blood Culture adequate volume   Culture   Final    NO GROWTH < 12 HOURS Performed at [facility], [address] [place], [place] [address]    Report Status PENDING  Incomplete     Radiology Studies: US  PELVIC TRANSABD W/PELVIC DOPPLER Result Date: 01/18/2024 CLINICAL DATA:  Adnexal cyst seen on CT EXAM: TRANSABDOMINAL ULTRASOUND OF PELVIS DOPPLER ULTRASOUND OF OVARIES TECHNIQUE: Transabdominal ultrasound examination of the pelvis was performed including evaluation of the uterus, ovaries, adnexal regions, and pelvic cul-de-sac. Color and duplex Doppler ultrasound was utilized to evaluate blood flow to the ovaries. COMPARISON:  CT abdomen pelvis 01/17/2024 FINDINGS: Uterus Hysterectomy. Right ovary Measurements: 5.6 x 4.0 x 5.3 cm. = volume: 62 mL. There is a 4.5 x 3.6 x 3.5 cm heterogenous cystic lesion with internal hyperechogenicity and reticulations suggestive of hemorrhagic cyst. No evidence of internal vascularity. Left ovary Measurements: 3.7 x 2.4 x 3.1 cm = volume: 14.3 mL. Normal appearance/no adnexal mass. Pulsed Doppler evaluation demonstrates normal low-resistance arterial and venous waveforms in both ovaries. Other: Small amount of free fluid in the right adnexa. IMPRESSION: 1. 4.5 cm hemorrhagic cyst in the right ovary. Consider follow-up ultrasound in 6-12 weeks to ensure resolution. 2. Small amount of free fluid in  the right adnexa. Electronically Signed   By: Rozell Cornet M.D.   On: 01/18/2024 02:05   CT ABDOMEN PELVIS W CONTRAST Result Date: 01/17/2024 CLINICAL DATA:  Acute abdominal pain, bowel obstruction suspected. Swelling in abdomen. 20 pound weight gain in the past week. Gastric bypass in February of 2023. Lower right quadrant pain. EXAM: CT ABDOMEN AND PELVIS WITH CONTRAST TECHNIQUE: Multidetector CT imaging of the abdomen and pelvis was performed  using the standard protocol following bolus administration of intravenous contrast. RADIATION DOSE REDUCTION: This exam was performed according to the departmental dose-optimization program which includes automated exposure control, adjustment of the mA and/or kV according to patient size and/or use of iterative reconstruction technique. CONTRAST:  OMNIPAQUE  IOHEXOL  300 MG/ML  SOLN COMPARISON:  CT 08/14/2023 FINDINGS: Lower chest: No acute abnormality. Hepatobiliary: Periportal edema. Cholecystectomy. No biliary dilation. Pancreas: No pancreatic ductal dilation. There is some fluid and stranding about the pancreatic head. However this appears centered about the descending duodenum. Spleen: Splenomegaly measuring 15.1 cm in craniocaudal dimension. Adrenals/Urinary Tract: Normal adrenal glands. 2.3 cm cystic lesion in the posterior left kidney is slightly denser than simple fluid density (Hounsfield units 34). Delayed right nephrogram. Urothelial thickening and hyperenhancement about the proximal right ureter and right renal pelvis. Mild right hydronephrosis. No evidence of obstructing stone. Bladder is unremarkable. Stomach/Bowel: Postoperative change of Roux-en-Y gastric bypass. Mild wall thickening about the descending duodenum with adjacent fluid and stranding. No organized fluid collection. No free intraperitoneal air. No evidence of obstruction. The appendix is normal. Vascular/Lymphatic: No significant vascular findings are present. No enlarged abdominal or  pelvic lymph nodes. Reproductive: Hysterectomy. 4.5 cm right adnexal cyst with adjacent small volume free fluid in the pelvis. Other: Free fluid about the descending duodenum/pancreatic head extending inferiorly in the anterior pararenal space. Additional free fluid in the pelvis. No free intraperitoneal air. No drainable fluid collection. Musculoskeletal: No acute fracture.  Body wall edema. IMPRESSION: 1. Free fluid about the descending duodenum and pancreatic head. The fluid appears centered about the duodenum and is favored to represent duodenitis however groove pancreatitis could appear similarly. Correlate with lipase. No organized fluid collection. No free intraperitoneal air. 2. Delayed right nephrogram with urothelial thickening and hyperenhancement about the proximal right ureter and right renal pelvis. Mild right hydronephrosis. No evidence of obstructing stone. Findings are concerning for ascending urinary tract infection. 3. 4.5 cm right adnexal cyst with adjacent small volume free fluid in the pelvis. In the setting of right lower quadrant abdominal pain this could be further evaluated with pelvic ultrasound. 4. 2.3 cm cystic lesion in the posterior left kidney is slightly denser than simple fluid density. Further evaluation with nonemergent renal protocol MRI with and without IV contrast is recommended. 5. Splenomegaly. 6. Periportal and body wall edema. Electronically Signed   By: Rozell Cornet M.D.   On: 01/17/2024 18:20    Scheduled Meds:  ambrisentan   10 mg Oral Daily   citalopram   40 mg Oral Daily   estradiol   2 mg Oral Daily   nicotine   21 mg Transdermal Daily   pantoprazole  (PROTONIX ) IV  40 mg Intravenous Q12H   Selexipag   1,200 mcg Oral BID   tadalafil  (PAH)  40 mg Oral Daily   Continuous Infusions:  sodium chloride      cefoTEtan  (CEFOTAN ) IV       LOS: 0 days  MDM: Patient is high risk for one or more organ failure.  They necessitate ongoing hospitalization for continued  IV therapies and subsequent lab monitoring. Total time spent interpreting labs and vitals, reviewing the medical record, coordinating care amongst consultants and care team members, directly assessing and discussing care with the patient and/or family: 55 min  Nellene Courtois, DO [facility]  To contact the attending physician between 7A-7P please use Epic Chat. To contact the covering physician during after hours 7P-7A, please review Amion.  01/18/2024, 9:39 AM   *This document has been created with  the assistance of dictation software. Please excuse typographical errors. *

## 2024-01-18 NOTE — Progress Notes (Signed)
   CROSS COVER NOTE  Patient name: Rachel Wall MRN: [id number] DOB : 1981-02-16  Concern as stated by nurse / staff   Pt allergic to adhesive (itchy. had tele stickers on and from IV dressing/tape. Can we get benadryl for her?      Review of Pertinent findings: Patient las listed "low" allergy to adhesives in chart resulting in rash.   Assessment and  Interventions   Assessment:vitals remain stable with only local signs of allergic dermatitis at this time. Personally, I do not prescribe benadryl in my practice due to the well documented adverse effects of use. Alternatives provided.   Plan: Triamcinolone  cream locally Loratadine   Monitor for worsening Avoid adhesive bandages

## 2024-01-18 NOTE — Plan of Care (Signed)

## 2024-01-19 ENCOUNTER — Inpatient Hospital Stay (HOSPITAL_COMMUNITY)
Admit: 2024-01-19 | Discharge: 2024-01-19 | Disposition: A | Discharge status: Home / Self Care | Attending: Family Medicine | Admitting: Family Medicine

## 2024-01-19 DIAGNOSIS — I5031 Acute diastolic (congestive) heart failure: Secondary | ICD-10-CM

## 2024-01-19 DIAGNOSIS — I2721 Secondary pulmonary arterial hypertension: Secondary | ICD-10-CM | POA: Diagnosis not present

## 2024-01-19 DIAGNOSIS — Z720 Tobacco use: Secondary | ICD-10-CM | POA: Diagnosis not present

## 2024-01-19 DIAGNOSIS — N83201 Unspecified ovarian cyst, right side: Secondary | ICD-10-CM

## 2024-01-19 DIAGNOSIS — F418 Other specified anxiety disorders: Secondary | ICD-10-CM | POA: Diagnosis not present

## 2024-01-19 DIAGNOSIS — R109 Unspecified abdominal pain: Secondary | ICD-10-CM | POA: Diagnosis not present

## 2024-01-19 LAB — CBC WITH DIFFERENTIAL/PLATELET
Abs Immature Granulocytes: 0.03 10*3/uL (ref 0.00–0.07)
Basophils Absolute: 0 10*3/uL (ref 0.0–0.1)
Basophils Relative: 0 %
Eosinophils Absolute: 0.1 10*3/uL (ref 0.0–0.5)
Eosinophils Relative: 2 %
HCT: 28.5 % — ABNORMAL LOW (ref 36.0–46.0)
Hemoglobin: 9.5 g/dL — ABNORMAL LOW (ref 12.0–15.0)
Immature Granulocytes: 1 %
Lymphocytes Relative: 17 %
Lymphs Abs: 0.6 10*3/uL — ABNORMAL LOW (ref 0.7–4.0)
MCH: 32.8 pg (ref 26.0–34.0)
MCHC: 33.3 g/dL (ref 30.0–36.0)
MCV: 98.3 fL (ref 80.0–100.0)
Monocytes Absolute: 0.8 10*3/uL (ref 0.1–1.0)
Monocytes Relative: 23 %
Neutro Abs: 1.8 10*3/uL (ref 1.7–7.7)
Neutrophils Relative %: 57 %
Platelets: 130 10*3/uL — ABNORMAL LOW (ref 150–400)
RBC: 2.9 MIL/uL — ABNORMAL LOW (ref 3.87–5.11)
RDW: 13.4 % (ref 11.5–15.5)
WBC: 3.2 10*3/uL — ABNORMAL LOW (ref 4.0–10.5)
nRBC: 0 % (ref 0.0–0.2)

## 2024-01-19 LAB — COMPREHENSIVE METABOLIC PANEL WITH GFR
ALT: 21 U/L (ref 0–44)
AST: 16 U/L (ref 15–41)
Albumin: 2.7 g/dL — ABNORMAL LOW (ref 3.5–5.0)
Alkaline Phosphatase: 95 U/L (ref 38–126)
Anion gap: 7 (ref 5–15)
BUN: 5 mg/dL — ABNORMAL LOW (ref 6–20)
CO2: 20 mmol/L — ABNORMAL LOW (ref 22–32)
Calcium: 8.1 mg/dL — ABNORMAL LOW (ref 8.9–10.3)
Chloride: 108 mmol/L (ref 98–111)
Creatinine, Ser: 0.65 mg/dL (ref 0.44–1.00)
GFR, Estimated: >60 mL/min (ref >60)
Glucose, Bld: 78 mg/dL (ref 70–99)
Potassium: 3.7 mmol/L (ref 3.5–5.1)
Sodium: 135 mmol/L (ref 135–145)
Total Bilirubin: 0.5 mg/dL (ref 0.0–1.2)
Total Protein: 5.2 g/dL — ABNORMAL LOW (ref 6.5–8.1)

## 2024-01-19 LAB — URINE CULTURE: Culture: >=100000 — AB

## 2024-01-19 LAB — ECHOCARDIOGRAM COMPLETE
AR max vel: 3.19 cm2
AV Area VTI: 2.7 cm2
AV Area mean vel: 2.66 cm2
AV Mean grad: 8 mmHg
AV Peak grad: 14 mmHg
Ao pk vel: 1.87 m/s
Area-P 1/2: 3.43 cm2
Calc EF: 54.5 %
Height: 68 in
MV VTI: 2.28 cm2
S' Lateral: 3.5 cm
Single Plane A2C EF: 58.7 %
Single Plane A4C EF: 51.7 %
Weight: 2745.6 [oz_av]

## 2024-01-19 MED ORDER — TRIAMCINOLONE ACETONIDE 0.5 % EX CREA: TOPICAL_CREAM | Freq: Three times a day (TID) | CUTANEOUS | Status: DC | PRN

## 2024-01-19 MED ORDER — SUMATRIPTAN SUCCINATE 50 MG PO TABS
50.0000 mg | ORAL_TABLET | ORAL | Status: AC | PRN
  Administered 2024-01-19 (×2): 50 mg via ORAL
  Filled 2024-01-19 (×2): qty 1

## 2024-01-19 MED ORDER — BUTALBITAL-APAP-CAFFEINE 50-325-40 MG PO TABS
1.0000 | ORAL_TABLET | ORAL | Status: DC | PRN
Start: 2024-01-19 — End: 2024-01-19
  Administered 2024-01-19: 1 via ORAL
  Filled 2024-01-19: qty 1

## 2024-01-19 MED ORDER — LACTULOSE 10 GM/15ML PO SOLN
20.0000 g | Freq: Three times a day (TID) | ORAL | Status: DC
  Administered 2024-01-19 – 2024-01-24 (×14): 20 g via ORAL
  Filled 2024-01-19 (×16): qty 30

## 2024-01-19 MED ORDER — HYDROMORPHONE HCL 1 MG/ML IJ SOLN
1.0000 mg | Freq: Three times a day (TID) | INTRAMUSCULAR | Status: DC | PRN
  Administered 2024-01-19 – 2024-01-23 (×10): 1 mg via INTRAVENOUS
  Filled 2024-01-19 (×12): qty 1

## 2024-01-19 MED ORDER — LORAZEPAM 0.5 MG PO TABS
0.5000 mg | ORAL_TABLET | Freq: Once | ORAL | Status: AC
  Administered 2024-01-19: 0.5 mg via ORAL
  Filled 2024-01-19: qty 1

## 2024-01-19 MED ORDER — SODIUM CHLORIDE 0.9 % IV SOLN
1.0000 g | INTRAVENOUS | Status: AC
  Administered 2024-01-20 – 2024-01-23 (×5): 1 g via INTRAVENOUS
  Filled 2024-01-19 (×5): qty 10

## 2024-01-19 MED ORDER — FUROSEMIDE 10 MG/ML IJ SOLN
40.0000 mg | Freq: Once | INTRAMUSCULAR | Status: AC
  Administered 2024-01-19: 40 mg via INTRAVENOUS
  Filled 2024-01-19: qty 4

## 2024-01-19 MED ORDER — ESTRADIOL 0.5 MG PO TABS
2.0000 mg | ORAL_TABLET | Freq: Every day | ORAL | Status: DC
  Administered 2024-01-19 – 2024-01-25 (×7): 2 mg via ORAL
  Filled 2024-01-19 (×7): qty 4

## 2024-01-19 MED ORDER — FUROSEMIDE 10 MG/ML IJ SOLN
20.0000 mg | Freq: Two times a day (BID) | INTRAMUSCULAR | Status: DC
  Administered 2024-01-20 – 2024-01-21 (×4): 20 mg via INTRAVENOUS
  Filled 2024-01-19 (×4): qty 4

## 2024-01-19 NOTE — Progress Notes (Signed)
*  PRELIMINARY RESULTS* Echocardiogram 2D Echocardiogram has been performed.  Rachel Wall 01/19/2024, 12:41 PM

## 2024-01-19 NOTE — Progress Notes (Signed)
 PROGRESS NOTE    Rachel Wall  NGE:[id number] DOB: 1981-01-07 DOA: 01/17/2024 PCP: System, Provider Not In  Chief Complaint  Patient presents with   Abdominal Pain    Hospital Course:  Rachel Wall is 43 y.o. 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  Patient reports abdominal pain and abdominal distention for 2 days prior to arrival.  Labs in the ED reveal hemoglobin of 10.8, down from 13.8 in November 2024.  Blood pressure 94/66, vital signs otherwise unremarkable.  Pelvic ultrasound reveals 4.5 cm hemorrhagic cyst in the right ovary.  OB/GYN was consulted and recommended serial CBCs to ensure hemoglobin remained stable.  Subjective: Ongoing issues overnight with headache.  Received Fioricet  a second time.  Also complaining of itchiness around adhesive bandages.  She received loratadine  and triamcinolone  cream. This morning she complains of continued abdominal bloating.  She reports she is up another 10 pounds in water weight.  She is amendable to trying diuretics and monitor for improvement.  She also continues to complain of migraine headache.  Reports minimal relief with Fioricet .  Does endorse past success with sumatriptan . She says her right-sided flank pain is improving. Also endorsing difficulty with constipation and requesting assistance with bowel movements  Objective: Vitals:   01/18/24 1136 01/18/24 2056 01/19/24 0425 01/19/24 0514  BP: (!) 91/57 (!) 86/64 (!) 100/55   Pulse: 86 80 85   Resp: 17 16 16    Temp: 98.3 F (36.8 C) 98.5 F (36.9 C) 99.1 F (37.3 C)   TempSrc:  Oral    SpO2: 95% 99% 96%   Weight:    77.8 kg  Height:        Intake/Output Summary (Last 24 hours) at 01/19/2024 0934 Last data filed at 01/19/2024 0100 Gross  per 24 hour  Intake 467.33 ml  Output --  Net 467.33 ml   Filed Weights   01/17/24 1249 01/17/24 2100 01/19/24 0514  Weight: 73 kg 34.6 kg 77.8 kg    Examination: General exam: Appears calm and comfortable, NAD  Respiratory system: No work of breathing, symmetric chest wall expansion Cardiovascular system: S1 & S2 heard, RRR.  Gastrointestinal system: +abdominal bloating, very tender to palpation in right lower quadrant Neuro: Alert and oriented. No focal neurological deficits. Extremities: Symmetric, no peripheral edema, expected range of motion. Skin: No rashes, lesions Psychiatry: Demonstrates appropriate judgement and insight. Mood & affect appropriate for situation.   Assessment & Plan:  Principal Problem:   Abdominal pain Active Problems:   Duodenitis   Adnexal cyst   UTI (urinary tract infection)   Kidney cysts   Pulmonary arterial hypertension (HCC)   Chronic diastolic CHF (congestive heart failure) (HCC)   HTN (hypertension)   Normocytic anemia   Hypokalemia   Tobacco use   Depression with anxiety   Hemorrhagic cyst of right ovary    Abdominal pain - Likely multifactorial from hemorrhagic cyst, abdominal swelling, duodenitis, increasing volume overload, and what appears to be right-sided pyelonephritis - EDP discussed directly with Dr. Mauri Sous of surgery who reports no acute surgical intervention necessary - Continue with as needed pain control  Hemorrhagic right ovarian cyst - As seen on pelvic ultrasound - CBC reveals downtrending hemoglobin though stable. - Dr. Alvia Awkward of OB/GYN was consulted, no acute surgical interventions necessary given stability of hemoglobin.  Unlikely  that pain is entirely caused by the cyst.  Patient has requested for oophorectomy, outpatient follow-up has been arranged -CA125 was ordered  Duodenitis - Some free fluid seen around descending duodenum/pancreatic head. - Continue with PPI twice daily  Pyelonephritis - Patient  presented with abdominal pain, right-sided flank pain, temperature 100.1.  CT findings consistent with ascending UTI.  Urine cultures now with 100,000 CFU E. coli, does have some resistance - Continue with ceftriaxone  for now - Continue to trend CBC  Kidney cyst - Incidentally seen on CT.  Cannot perform MRI due to pacemaker - Needs outpatient follow-up for serial monitoring  Pulmonary arterial hypertension - Continue with home meds: Ambrisentan , tadalafil , selexipag   Chronic diastolic heart failure - Most recent echocardiogram: EF over 55%,  Pulmonary arterial hypertension. - BNP is less than 100 the patient endorses feeling swollen all over and does have notable abdominal edema.  Patient reports she is up 20 pounds from dry weight - Repeat echocardiogram appears similar to prior - Initiate Lasix  40 mg IV push and monitor strict I's and outs - Home dose Lasix  and spironolactone  have been held during this admission given persistently low blood pressures.  Will continue to monitor closely and proceed with Lasix  as BP allows - Not a great candidate for midodrine to allow diuresis given PAH   - Monitor volume status closely  Hypertension -- BPS low on current meds. Hold dilt to allow for diuresis - Continue close monitoring.  On telemetry.  Normocytic anemia - Hemoglobin 10.8 on arrival, most recently 13.8 in November 2024. - Blood loss likely may be secondary to hemorrhagic ovarian cysts, though unlikely to be entire reason.   -Iron panel reveals low TSAT, may be some component of iron deficiency at play.  Last colonoscopy in 2022 with large polyp resected, also history of esophagitis seen on EGD.  No overt bleeding at current. - Continue to monitor hemoglobin closely.  Transfuse if hemoglobin falls below 7  Hypokalemia - Replace as needed  Tobacco abuse - Nicotine  patch  Depression with anxiety - Resume home meds  Splenomegaly - 15.1 cm in craniocaudal dimension.  Incidentally  seen on CT.  Left kidney cyst - Incidentally seen on CT.  2.3 cm cystic lesion in posterior left kidney - Will require renal protocol MRI with and without contrast.  Can be performed nonurgently outpatient.  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  Monitor closely for electrolyte abnormalities.  Replace as needed  Headache - PRN sumatriptan   Upper respiratory symptoms - Ear pain, itchy eyes. - Respiratory viral panel negatuve - Flonase , saline spray, eyedrops as needed  DVT prophylaxis: SCDs for now   Code Status: Full Code Disposition: Inpatient, still hospitalized for workup.  Will eventually discharge home with family   Consultants:  General surgery Gynecology  Procedures:    Antimicrobials:  Anti-infectives (From admission, onward)    Start     Dose/Rate Route Frequency Ordered Stop   01/18/24 2000  cefTRIAXone  (ROCEPHIN ) 1 g in sodium chloride  0.9 % 100 mL IVPB  Status:  Discontinued        1 g 200 mL/hr over 30 Minutes Intravenous Every 24 hours 01/17/24 2029 01/18/24 0257   01/18/24 0800  cefoTEtan  (CEFOTAN ) 1 g in sodium chloride  0.9 % 100 mL IVPB        1 g 200 mL/hr over 30 Minutes Intravenous Every 12 hours 01/18/24 0257     01/17/24 1900  cefTRIAXone  (ROCEPHIN ) 1 g in sodium chloride  0.9 % 100  mL IVPB        1 g 200 mL/hr over 30 Minutes Intravenous  Once 01/17/24 1845 01/17/24 1959       Data Reviewed: I have personally reviewed following labs and imaging studies CBC: Recent Labs  Lab 01/17/24 1258 01/18/24 0858 01/18/24 1344  WBC 4.4 4.6 4.1  NEUTROABS  --   --  2.3  HGB 10.8* 9.5* 9.7*  HCT 32.1* 28.4* 28.7*  MCV 100.0 96.9 98.6  PLT 132* 128* 113*   Basic Metabolic Panel: Recent Labs  Lab 01/17/24 1258 01/17/24 2130 01/18/24 0317  NA 135  --  133*  K 3.2*  --  3.8  CL 112*  --  107  CO2 19*  --  22  GLUCOSE 105*  --  98  BUN 11  --  9  CREATININE 0.71  --  0.60  CALCIUM 8.7*  --  8.3*  MG  --  1.9  --   PHOS  --  1.9*  --     GFR: Estimated Creatinine Clearance: 99.5 mL/min (by C-G formula based on SCr of 0.6 mg/dL). Liver Function Tests: Recent Labs  Lab 01/17/24 1258 01/17/24 2130  AST 22 28  ALT 27 31  ALKPHOS 93 117  BILITOT 0.6 0.4  PROT 5.7* 5.8*  ALBUMIN 3.1* 3.1*   CBG: No results for input(s): "GLUCAP" in the last 168 hours.  Recent Results (from the past 240 hours)  Urine Culture     Status: Abnormal (Preliminary result)   Collection Time: 01/17/24 12:39 PM   Specimen: Urine, Clean Catch  Result Value Ref Range Status   Specimen Description   Final    URINE, CLEAN CATCH Performed at [facility], [address] [place], [place] [address]    Special Requests   Final    NONE Performed at [facility], [address], [place], [place] [address]    Culture (A)  Final    >=100,000 COLONIES/mL ESCHERICHIA COLI SUSCEPTIBILITIES TO FOLLOW Performed at [facility], [address], [place], [place] [address]    Report Status PENDING  Incomplete  Culture, blood (Routine X 2) w Reflex to ID Panel     Status: None (Preliminary result)   Collection Time: 01/17/24 10:29 PM   Specimen: BLOOD  Result Value Ref Range Status   Specimen Description BLOOD BLOOD LEFT HAND  Final   Special Requests   Final    BOTTLES DRAWN AEROBIC AND ANAEROBIC Blood Culture adequate volume   Culture   Final    NO GROWTH 2 DAYS Performed at [facility], [address], [place], [place] [address]    Report Status PENDING  Incomplete  Culture, blood (Routine X 2) w Reflex to ID Panel     Status: None (Preliminary result)   Collection Time: 01/17/24 10:33 PM   Specimen: BLOOD  Result Value Ref Range Status   Specimen Description BLOOD BLOOD RIGHT ARM  Final   Special Requests   Final    BOTTLES DRAWN AEROBIC AND ANAEROBIC Blood Culture adequate volume   Culture   Final    NO GROWTH 2 DAYS Performed at [facility], [address] [place], [place] [address]     Report Status PENDING  Incomplete  Resp panel by RT-PCR (RSV, Flu A&B, Covid) Anterior Nasal Swab     Status: None   Collection Time: 01/18/24 12:29 PM   Specimen: Anterior Nasal Swab  Result Value Ref Range Status  SARS Coronavirus 2 by RT PCR NEGATIVE NEGATIVE Final    Comment: (NOTE) SARS-CoV-2 target nucleic acids are NOT DETECTED.  The SARS-CoV-2 RNA is generally detectable in upper respiratory specimens during the acute phase of infection. The lowest concentration of SARS-CoV-2 viral copies this assay can detect is 138 copies/mL. A negative result does not preclude SARS-Cov-2 infection and should not be used as the sole basis for treatment or other patient management decisions. A negative result may occur with  improper specimen collection/handling, submission of specimen other than nasopharyngeal swab, presence of viral mutation(s) within the areas targeted by this assay, and inadequate number of viral copies(<138 copies/mL). A negative result must be combined with clinical observations, patient history, and epidemiological information. The expected result is Negative.  Fact Sheet for Patients:  BloggerCourse.com  Fact Sheet for Healthcare Providers:  SeriousBroker.it  This test is no t yet approved or cleared by the [place]  FDA and  has been authorized for detection and/or diagnosis of SARS-CoV-2 by FDA under an Emergency Use Authorization (EUA). This EUA will remain  in effect (meaning this test can be used) for the duration of the COVID-19 declaration under Section 564(b)(1) of the Act, 21 U.S.C.section 360bbb-3(b)(1), unless the authorization is terminated  or revoked sooner.       Influenza A by PCR NEGATIVE NEGATIVE Final   Influenza B by PCR NEGATIVE NEGATIVE Final    Comment: (NOTE) The Xpert Xpress SARS-CoV-2/FLU/RSV plus assay is intended as an aid in the diagnosis of influenza from Nasopharyngeal swab  specimens and should not be used as a sole basis for treatment. Nasal washings and aspirates are unacceptable for Xpert Xpress SARS-CoV-2/FLU/RSV testing.  Fact Sheet for Patients: BloggerCourse.com  Fact Sheet for Healthcare Providers: SeriousBroker.it  This test is not yet approved or cleared by the [place]  FDA and has been authorized for detection and/or diagnosis of SARS-CoV-2 by FDA under an Emergency Use Authorization (EUA). This EUA will remain in effect (meaning this test can be used) for the duration of the COVID-19 declaration under Section 564(b)(1) of the Act, 21 U.S.C. section 360bbb-3(b)(1), unless the authorization is terminated or revoked.     Resp Syncytial Virus by PCR NEGATIVE NEGATIVE Final    Comment: (NOTE) Fact Sheet for Patients: BloggerCourse.com  Fact Sheet for Healthcare Providers: SeriousBroker.it  This test is not yet approved or cleared by the [place]  FDA and has been authorized for detection and/or diagnosis of SARS-CoV-2 by FDA under an Emergency Use Authorization (EUA). This EUA will remain in effect (meaning this test can be used) for the duration of the COVID-19 declaration under Section 564(b)(1) of the Act, 21 U.S.C. section 360bbb-3(b)(1), unless the authorization is terminated or revoked.  Performed at [facility], [address] [place], [place] [address]      Radiology Studies: US  PELVIC TRANSABD W/PELVIC DOPPLER Result Date: 01/18/2024 CLINICAL DATA:  Adnexal cyst seen on CT EXAM: TRANSABDOMINAL ULTRASOUND OF PELVIS DOPPLER ULTRASOUND OF OVARIES TECHNIQUE: Transabdominal ultrasound examination of the pelvis was performed including evaluation of the uterus, ovaries, adnexal regions, and pelvic cul-de-sac. Color and duplex Doppler ultrasound was utilized to evaluate blood flow to the ovaries. COMPARISON:  CT  abdomen pelvis 01/17/2024 FINDINGS: Uterus Hysterectomy. Right ovary Measurements: 5.6 x 4.0 x 5.3 cm. = volume: 62 mL. There is a 4.5 x 3.6 x 3.5 cm heterogenous cystic lesion with internal hyperechogenicity and reticulations suggestive of hemorrhagic cyst. No evidence of internal vascularity. Left ovary Measurements: 3.7 x 2.4 x  3.1 cm = volume: 14.3 mL. Normal appearance/no adnexal mass. Pulsed Doppler evaluation demonstrates normal low-resistance arterial and venous waveforms in both ovaries. Other: Small amount of free fluid in the right adnexa. IMPRESSION: 1. 4.5 cm hemorrhagic cyst in the right ovary. Consider follow-up ultrasound in 6-12 weeks to ensure resolution. 2. Small amount of free fluid in the right adnexa. Electronically Signed   By: Rozell Cornet M.D.   On: 01/18/2024 02:05   CT ABDOMEN PELVIS W CONTRAST Result Date: 01/17/2024 CLINICAL DATA:  Acute abdominal pain, bowel obstruction suspected. Swelling in abdomen. 20 pound weight gain in the past week. Gastric bypass in February of 2023. Lower right quadrant pain. EXAM: CT ABDOMEN AND PELVIS WITH CONTRAST TECHNIQUE: Multidetector CT imaging of the abdomen and pelvis was performed using the standard protocol following bolus administration of intravenous contrast. RADIATION DOSE REDUCTION: This exam was performed according to the departmental dose-optimization program which includes automated exposure control, adjustment of the mA and/or kV according to patient size and/or use of iterative reconstruction technique. CONTRAST:  OMNIPAQUE  IOHEXOL  300 MG/ML  SOLN COMPARISON:  CT 08/14/2023 FINDINGS: Lower chest: No acute abnormality. Hepatobiliary: Periportal edema. Cholecystectomy. No biliary dilation. Pancreas: No pancreatic ductal dilation. There is some fluid and stranding about the pancreatic head. However this appears centered about the descending duodenum. Spleen: Splenomegaly measuring 15.1 cm in craniocaudal dimension.  Adrenals/Urinary Tract: Normal adrenal glands. 2.3 cm cystic lesion in the posterior left kidney is slightly denser than simple fluid density (Hounsfield units 34). Delayed right nephrogram. Urothelial thickening and hyperenhancement about the proximal right ureter and right renal pelvis. Mild right hydronephrosis. No evidence of obstructing stone. Bladder is unremarkable. Stomach/Bowel: Postoperative change of Roux-en-Y gastric bypass. Mild wall thickening about the descending duodenum with adjacent fluid and stranding. No organized fluid collection. No free intraperitoneal air. No evidence of obstruction. The appendix is normal. Vascular/Lymphatic: No significant vascular findings are present. No enlarged abdominal or pelvic lymph nodes. Reproductive: Hysterectomy. 4.5 cm right adnexal cyst with adjacent small volume free fluid in the pelvis. Other: Free fluid about the descending duodenum/pancreatic head extending inferiorly in the anterior pararenal space. Additional free fluid in the pelvis. No free intraperitoneal air. No drainable fluid collection. Musculoskeletal: No acute fracture.  Body wall edema. IMPRESSION: 1. Free fluid about the descending duodenum and pancreatic head. The fluid appears centered about the duodenum and is favored to represent duodenitis however groove pancreatitis could appear similarly. Correlate with lipase. No organized fluid collection. No free intraperitoneal air. 2. Delayed right nephrogram with urothelial thickening and hyperenhancement about the proximal right ureter and right renal pelvis. Mild right hydronephrosis. No evidence of obstructing stone. Findings are concerning for ascending urinary tract infection. 3. 4.5 cm right adnexal cyst with adjacent small volume free fluid in the pelvis. In the setting of right lower quadrant abdominal pain this could be further evaluated with pelvic ultrasound. 4. 2.3 cm cystic lesion in the posterior left kidney is slightly denser than  simple fluid density. Further evaluation with nonemergent renal protocol MRI with and without IV contrast is recommended. 5. Splenomegaly. 6. Periportal and body wall edema. Electronically Signed   By: Rozell Cornet M.D.   On: 01/17/2024 18:20    Scheduled Meds:  ambrisentan   10 mg Oral Daily   citalopram   40 mg Oral Daily   diltiazem   120 mg Oral BID   estradiol   2 mg Oral Daily   fluticasone   1 spray Each Nare Daily   loratadine   10  mg Oral Daily   nicotine   21 mg Transdermal Daily   pantoprazole  (PROTONIX ) IV  40 mg Intravenous Q12H   Selexipag   1,200 mcg Oral BID   tadalafil  (PAH)  40 mg Oral Daily   triamcinolone  cream   Topical TID   Continuous Infusions:  cefoTEtan  (CEFOTAN ) IV Stopped (01/18/24 2146)     LOS: 1 day  MDM: Patient is high risk for one or more organ failure.  They necessitate ongoing hospitalization for continued IV therapies and subsequent lab monitoring. Total time spent interpreting labs and vitals, reviewing the medical record, coordinating care amongst consultants and care team members, directly assessing and discussing care with the patient and/or family: 55 min  Rachel Helm, DO [facility]  To contact the attending physician between 7A-7P please use Epic Chat. To contact the covering physician during after hours 7P-7A, please review Amion.  01/19/2024, 9:34 AM   *This document has been created with the assistance of dictation software. Please excuse typographical errors. *

## 2024-01-19 NOTE — Plan of Care (Signed)
  Problem: Health Behavior/Discharge Planning: Goal: Ability to manage health-related needs will improve Outcome: Progressing   Problem: Coping: Goal: Level of anxiety will decrease Outcome: Progressing   Problem: Pain Managment: Goal: General experience of comfort will improve and/or be controlled Outcome: Progressing   Problem: Safety: Goal: Ability to remain free from injury will improve Outcome: Progressing   Problem: Skin Integrity: Goal: Risk for impaired skin integrity will decrease Outcome: Progressing

## 2024-01-19 NOTE — Progress Notes (Addendum)
   CROSS COVER NOTE  Patient name: Rachel Wall MRN: [id number] DOB : December 06, 1980  Concern as stated by nurse / staff   pt having migraine. Had fiorcet yesterday x1. says it helped some.   Review of Pertinent findings: Remains borderline hypotensive. Has gotten several doses of PRN opioid medications.   Assessment and  Interventions   Assessment:symptoms described consistent with tension headache.   Plan: Avoid, limit opioids due to relative hypotension. Will proceed with requested fiorcet as it was efficacious previously.  Discontinue PRN antihypertensives.  Adding k-pad for also described abdominal pain

## 2024-01-20 DIAGNOSIS — Z720 Tobacco use: Secondary | ICD-10-CM | POA: Diagnosis not present

## 2024-01-20 DIAGNOSIS — F418 Other specified anxiety disorders: Secondary | ICD-10-CM | POA: Diagnosis not present

## 2024-01-20 DIAGNOSIS — I2721 Secondary pulmonary arterial hypertension: Secondary | ICD-10-CM | POA: Diagnosis not present

## 2024-01-20 DIAGNOSIS — I5032 Chronic diastolic (congestive) heart failure: Secondary | ICD-10-CM | POA: Diagnosis not present

## 2024-01-20 LAB — CBC WITH DIFFERENTIAL/PLATELET
Abs Immature Granulocytes: 0.09 10*3/uL — ABNORMAL HIGH (ref 0.00–0.07)
Basophils Absolute: 0 10*3/uL (ref 0.0–0.1)
Basophils Relative: 0 %
Eosinophils Absolute: 0.1 10*3/uL (ref 0.0–0.5)
Eosinophils Relative: 2 %
HCT: 33 % — ABNORMAL LOW (ref 36.0–46.0)
Hemoglobin: 11.1 g/dL — ABNORMAL LOW (ref 12.0–15.0)
Immature Granulocytes: 3 %
Lymphocytes Relative: 17 %
Lymphs Abs: 0.6 10*3/uL — ABNORMAL LOW (ref 0.7–4.0)
MCH: 32.4 pg (ref 26.0–34.0)
MCHC: 33.6 g/dL (ref 30.0–36.0)
MCV: 96.2 fL (ref 80.0–100.0)
Monocytes Absolute: 0.6 10*3/uL (ref 0.1–1.0)
Monocytes Relative: 17 %
Neutro Abs: 2.2 10*3/uL (ref 1.7–7.7)
Neutrophils Relative %: 61 %
Platelets: 161 10*3/uL (ref 150–400)
RBC: 3.43 MIL/uL — ABNORMAL LOW (ref 3.87–5.11)
RDW: 13.7 % (ref 11.5–15.5)
WBC: 3.6 10*3/uL — ABNORMAL LOW (ref 4.0–10.5)
nRBC: 0 % (ref 0.0–0.2)

## 2024-01-20 LAB — COMPREHENSIVE METABOLIC PANEL WITH GFR
ALT: 22 U/L (ref 0–44)
AST: 15 U/L (ref 15–41)
Albumin: 2.9 g/dL — ABNORMAL LOW (ref 3.5–5.0)
Alkaline Phosphatase: 115 U/L (ref 38–126)
Anion gap: 7 (ref 5–15)
BUN: 6 mg/dL (ref 6–20)
CO2: 26 mmol/L (ref 22–32)
Calcium: 8.8 mg/dL — ABNORMAL LOW (ref 8.9–10.3)
Chloride: 105 mmol/L (ref 98–111)
Creatinine, Ser: 0.69 mg/dL (ref 0.44–1.00)
GFR, Estimated: >60 mL/min (ref >60)
Glucose, Bld: 149 mg/dL — ABNORMAL HIGH (ref 70–99)
Potassium: 3.6 mmol/L (ref 3.5–5.1)
Sodium: 138 mmol/L (ref 135–145)
Total Bilirubin: 0.5 mg/dL (ref 0.0–1.2)
Total Protein: 5.8 g/dL — ABNORMAL LOW (ref 6.5–8.1)

## 2024-01-20 LAB — CA 125: Cancer Antigen (CA) 125: 13.6 U/mL (ref 0.0–38.1)

## 2024-01-20 NOTE — Plan of Care (Signed)

## 2024-01-20 NOTE — Progress Notes (Signed)
 PROGRESS NOTE    Rachel Wall  ZOX:[id number] DOB: 06-05-1981 DOA: 01/17/2024 PCP: System, Provider Not In  Chief Complaint  Patient presents with   Abdominal Pain    Hospital Course:  Rachel Wall is 43 y.o. 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  Patient reports abdominal pain and abdominal distention for 2 days prior to arrival.  Labs in the ED reveal hemoglobin of 10.8, down from 13.8 in November 2024.  Blood pressure 94/66, vital signs otherwise unremarkable.  Pelvic ultrasound reveals 4.5 cm hemorrhagic cyst in the right ovary.  OB/GYN was consulted and recommended serial CBCs to ensure hemoglobin remained stable.  Subjective: Patient is endorsing headache this morning but reports that her abdominal pain is beginning to improve.  She is still very swollen but has had significant output with diuresis and would like to proceed     Objective: Vitals:   01/19/24 1010 01/19/24 2026 01/20/24 0334 01/20/24 0913  BP: (!) 95/58 105/66 (!) 93/49 (!) 93/53  Pulse: 88 99 75 88  Resp: 16  18 16   Temp: 98 F (36.7 C) 98.6 F (37 C) 99.4 F (37.4 C) 98.3 F (36.8 C)  TempSrc: Oral Oral Oral   SpO2: 97% 98% 93% 100%  Weight:      Height:        Intake/Output Summary (Last 24 hours) at 01/20/2024 0937 Last data filed at 01/20/2024 0549 Gross per 24 hour  Intake 802.4 ml  Output 1100 ml  Net -297.6 ml   Filed Weights   01/17/24 1249 01/17/24 2100 01/19/24 0514  Weight: 73 kg 34.6 kg 77.8 kg    Examination: General exam: Appears calm and comfortable, NAD  Respiratory system: No work of breathing, symmetric chest wall expansion Cardiovascular system: S1 & S2 heard, RRR.  Gastrointestinal system: +abdominal bloating, very tender to palpation  in right lower quadrant Neuro: Alert and oriented. No focal neurological deficits. Extremities: Symmetric, no peripheral edema, expected range of motion. Skin: No rashes, lesions Psychiatry: Demonstrates appropriate judgement and insight. Mood & affect appropriate for situation.   Assessment & Plan:  Principal Problem:   Abdominal pain Active Problems:   Duodenitis   Adnexal cyst   UTI (urinary tract infection)   Kidney cysts   Pulmonary arterial hypertension (HCC)   Chronic diastolic CHF (congestive heart failure) (HCC)   HTN (hypertension)   Normocytic anemia   Hypokalemia   Tobacco use   Depression with anxiety   Hemorrhagic cyst of right ovary    Abdominal pain - Likely multifactorial from hemorrhagic cyst, abdominal swelling, duodenitis, increasing volume overload, and what appears to be right-sided pyelonephritis - EDP discussed directly with Dr. Mauri Sous of surgery who reports no acute surgical intervention necessary - Continue with as needed pain control  Hemorrhagic right ovarian cyst - As seen on pelvic ultrasound - Hemoglobin initially downtrending though stabilizing now. - Dr. Alvia Awkward of OB/GYN was consulted, no acute surgical interventions necessary given stability of hemoglobin.  Unlikely that pain is entirely caused by the cyst.  Patient has requested for oophorectomy, outpatient follow-up has been arranged -CA125 was ordered, will follow  Duodenitis - Some free fluid seen around descending duodenum/pancreatic head. - Pain is mostly localized to right lower quadrant.  Doubt this is contributing - Continue with PPI twice daily  Pyelonephritis -  Patient presented with abdominal pain, right-sided flank pain, temperature 100.1.  CT findings consistent with ascending UTI.  Urine cultures now with 100,000 CFU E. coli, does have some resistance - Continue ceftriaxone  for now, will plan to discharge with cefpodoxime. - Right flank pain is resolved - Continue to  trend CBC.  No leukocytosis  Kidney cyst - Incidentally seen on CT.  Cannot perform MRI due to pacemaker - Needs outpatient follow-up for serial monitoring  Pulmonary arterial hypertension Pacemaker Present - Continue with home meds: Ambrisentan , tadalafil , selexipag  - Repeat EF without obvious acute worsening - Needs close outpatient follow-up with pulmonology and cardiology  Chronic diastolic heart failure - Most recent echocardiogram: EF over 55%,  Pulmonary arterial hypertension. - BNP is less than 100 the patient endorses feeling swollen all over and does have notable abdominal edema and increased weight - Repeat echocardiogram appears similar to prior - Continue to follow strict I's and O's - Continue with IV Lasix  for now, titrate as blood pressure allows - Not a great candidate for midodrine to allow diuresis given PAH   - Monitor volume status closely  Hypertension -- BPS low on current meds. Hold dilt to allow for diuresis - Continue close monitoring  Normocytic anemia - Hemoglobin 10.8 on arrival, most recently 13.8 in November 2024. - Blood loss likely may be secondary to hemorrhagic ovarian cysts, though unlikely to be entire reason.   -Iron panel reveals low TSAT, may be some component of iron deficiency at play.  Last colonoscopy in 2022 with large polyp resected, also history of esophagitis seen on EGD.  No overt bleeding at current. - Additionally downtrend may be secondary to hemodilution given patient's acute volume overload.  I suspect it is nearing a true her baseline now. - Continue to monitor hemoglobin closely  Hypokalemia - Replace as needed  Tobacco abuse - Nicotine  patch  Depression with anxiety - Resume home meds  Splenomegaly - 15.1 cm in craniocaudal dimension.  Incidentally seen on CT.  Left kidney cyst - Incidentally seen on CT.  2.3 cm cystic lesion in posterior left kidney - Will require renal protocol MRI with and without contrast.  Can  be performed nonurgently outpatient.  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  Continue to monitor closely for electrolyte abnormalities.  Replace as needed  Headache - PRN sumatriptan   Upper respiratory symptoms - Ear pain, itchy eyes. - Respiratory viral panel negatuve - Flonase , saline spray, eyedrops as needed  DVT prophylaxis: SCDs for now   Code Status: Full Code Disposition: Inpatient, still hospitalized for diruesis.  Will eventually discharge home with family   Consultants:  General surgery Gynecology  Procedures:    Antimicrobials:  Anti-infectives (From admission, onward)    Start     Dose/Rate Route Frequency Ordered Stop   01/20/24 0000  cefTRIAXone  (ROCEPHIN ) 1 g in sodium chloride  0.9 % 100 mL IVPB        1 g 200 mL/hr over 30 Minutes Intravenous Every 24 hours 01/19/24 1600 01/24/24 2359   01/18/24 2000  cefTRIAXone  (ROCEPHIN ) 1 g in sodium chloride  0.9 % 100 mL IVPB  Status:  Discontinued        1 g 200 mL/hr over 30 Minutes Intravenous Every 24 hours 01/17/24 2029 01/18/24 0257   01/18/24 0800  cefoTEtan  (CEFOTAN ) 1 g in sodium chloride  0.9 % 100 mL IVPB  Status:  Discontinued        1 g 200 mL/hr over 30 Minutes Intravenous Every 12 hours 01/18/24  0257 01/19/24 1600   01/17/24 1900  cefTRIAXone  (ROCEPHIN ) 1 g in sodium chloride  0.9 % 100 mL IVPB        1 g 200 mL/hr over 30 Minutes Intravenous  Once 01/17/24 1845 01/17/24 1959       Data Reviewed: I have personally reviewed following labs and imaging studies CBC: Recent Labs  Lab 01/17/24 1258 01/18/24 0858 01/18/24 1344 01/19/24 1020  WBC 4.4 4.6 4.1 3.2*  NEUTROABS  --   --  2.3 1.8  HGB 10.8* 9.5* 9.7* 9.5*  HCT 32.1* 28.4* 28.7* 28.5*  MCV 100.0 96.9 98.6 98.3  PLT 132* 128* 113* 130*   Basic Metabolic Panel: Recent Labs  Lab 01/17/24 1258 01/17/24 2130 01/18/24 0317 01/19/24 1020  NA 135  --  133* 135  K 3.2*  --  3.8 3.7  CL 112*  --  107 108  CO2 19*  --  22 20*  GLUCOSE  105*  --  98 78  BUN 11  --  9 5*  CREATININE 0.71  --  0.60 0.65  CALCIUM 8.7*  --  8.3* 8.1*  MG  --  1.9  --   --   PHOS  --  1.9*  --   --    GFR: Estimated Creatinine Clearance: 99.5 mL/min (by C-G formula based on SCr of 0.65 mg/dL). Liver Function Tests: Recent Labs  Lab 01/17/24 1258 01/17/24 2130 01/19/24 1020  AST 22 28 16   ALT 27 31 21   ALKPHOS 93 117 95  BILITOT 0.6 0.4 0.5  PROT 5.7* 5.8* 5.2*  ALBUMIN 3.1* 3.1* 2.7*   CBG: No results for input(s): "GLUCAP" in the last 168 hours.  Recent Results (from the past 240 hours)  Urine Culture     Status: Abnormal   Collection Time: 01/17/24 12:39 PM   Specimen: Urine, Clean Catch  Result Value Ref Range Status   Specimen Description   Final    URINE, CLEAN CATCH Performed at [facility], [address] [place], [place] [address]    Special Requests   Final    NONE Performed at [facility], [address] [place], [place] [address]    Culture >=100,000 COLONIES/mL ESCHERICHIA COLI (A)  Final   Report Status 01/19/2024 FINAL  Final   Organism ID, Bacteria ESCHERICHIA COLI (A)  Final      Susceptibility   Escherichia coli - MIC*    AMPICILLIN >=32 RESISTANT Resistant     CEFAZOLIN  <=4 SENSITIVE Sensitive     CEFTRIAXONE  <=0.25 SENSITIVE Sensitive     CIPROFLOXACIN <=0.25 SENSITIVE Sensitive     GENTAMICIN >=16 RESISTANT Resistant     IMIPENEM <=0.25 SENSITIVE Sensitive     NITROFURANTOIN <=16 SENSITIVE Sensitive     TRIMETH/SULFA <=20 SENSITIVE Sensitive     AMPICILLIN/SULBACTAM >=32 RESISTANT Resistant     * >=100,000 COLONIES/mL ESCHERICHIA COLI  Culture, blood (Routine X 2) w Reflex to ID Panel     Status: None (Preliminary result)   Collection Time: 01/17/24 10:29 PM   Specimen: BLOOD  Result Value Ref Range Status   Specimen Description BLOOD BLOOD LEFT HAND  Final   Special Requests   Final    BOTTLES DRAWN AEROBIC AND ANAEROBIC Blood Culture adequate volume   Culture    Final    NO GROWTH 3 DAYS Performed at [facility], [address] [place], [place] [address]    Report Status PENDING  Incomplete  Culture, blood (Routine X  2) w Reflex to ID Panel     Status: None (Preliminary result)   Collection Time: 01/17/24 10:33 PM   Specimen: BLOOD  Result Value Ref Range Status   Specimen Description BLOOD BLOOD RIGHT ARM  Final   Special Requests   Final    BOTTLES DRAWN AEROBIC AND ANAEROBIC Blood Culture adequate volume   Culture   Final    NO GROWTH 3 DAYS Performed at [facility], [address], [place], [place] [address]    Report Status PENDING  Incomplete  Resp panel by RT-PCR (RSV, Flu A&B, Covid) Anterior Nasal Swab     Status: None   Collection Time: 01/18/24 12:29 PM   Specimen: Anterior Nasal Swab  Result Value Ref Range Status   SARS Coronavirus 2 by RT PCR NEGATIVE NEGATIVE Final    Comment: (NOTE) SARS-CoV-2 target nucleic acids are NOT DETECTED.  The SARS-CoV-2 RNA is generally detectable in upper respiratory specimens during the acute phase of infection. The lowest concentration of SARS-CoV-2 viral copies this assay can detect is 138 copies/mL. A negative result does not preclude SARS-Cov-2 infection and should not be used as the sole basis for treatment or other patient management decisions. A negative result may occur with  improper specimen collection/handling, submission of specimen other than nasopharyngeal swab, presence of viral mutation(s) within the areas targeted by this assay, and inadequate number of viral copies(<138 copies/mL). A negative result must be combined with clinical observations, patient history, and epidemiological information. The expected result is Negative.  Fact Sheet for Patients:  BloggerCourse.com  Fact Sheet for Healthcare Providers:  SeriousBroker.it  This test is no t yet approved or cleared by the [place]  FDA and   has been authorized for detection and/or diagnosis of SARS-CoV-2 by FDA under an Emergency Use Authorization (EUA). This EUA will remain  in effect (meaning this test can be used) for the duration of the COVID-19 declaration under Section 564(b)(1) of the Act, 21 U.S.C.section 360bbb-3(b)(1), unless the authorization is terminated  or revoked sooner.       Influenza A by PCR NEGATIVE NEGATIVE Final   Influenza B by PCR NEGATIVE NEGATIVE Final    Comment: (NOTE) The Xpert Xpress SARS-CoV-2/FLU/RSV plus assay is intended as an aid in the diagnosis of influenza from Nasopharyngeal swab specimens and should not be used as a sole basis for treatment. Nasal washings and aspirates are unacceptable for Xpert Xpress SARS-CoV-2/FLU/RSV testing.  Fact Sheet for Patients: BloggerCourse.com  Fact Sheet for Healthcare Providers: SeriousBroker.it  This test is not yet approved or cleared by the [place]  FDA and has been authorized for detection and/or diagnosis of SARS-CoV-2 by FDA under an Emergency Use Authorization (EUA). This EUA will remain in effect (meaning this test can be used) for the duration of the COVID-19 declaration under Section 564(b)(1) of the Act, 21 U.S.C. section 360bbb-3(b)(1), unless the authorization is terminated or revoked.     Resp Syncytial Virus by PCR NEGATIVE NEGATIVE Final    Comment: (NOTE) Fact Sheet for Patients: BloggerCourse.com  Fact Sheet for Healthcare Providers: SeriousBroker.it  This test is not yet approved or cleared by the [place]  FDA and has been authorized for detection and/or diagnosis of SARS-CoV-2 by FDA under an Emergency Use Authorization (EUA). This EUA will remain in effect (meaning this test can be used) for the duration of the COVID-19 declaration under Section 564(b)(1) of the Act, 21 U.S.C. section 360bbb-3(b)(1),  unless the authorization is terminated or revoked.  Performed  at [facility], [address] [place], [place] [address]      Radiology Studies: ECHOCARDIOGRAM COMPLETE Result Date: 01/19/2024    ECHOCARDIOGRAM REPORT   Patient Name:   GAELLE ADRIANCE Date of Exam: 01/19/2024 Medical Rec #:  [id number]     Height:       68.0 in Accession #:    [id number]    Weight:       171.6 lb Date of Birth:  05-01-1981    BSA:          1.915 m Patient Age:    42 years      BP:           100/55 mmHg Patient Gender: F             HR:           71 bpm. Exam Location:  [facility] Procedure: 2D Echo, Color Doppler and Cardiac Doppler (Both Spectral and Color            Flow Doppler were utilized during procedure). Indications:     CHF-Acute Diastolic I50.31  History:         Patient has no prior history of Echocardiogram examinations.                  CHF. PHTN.  Sonographer:     Kathaleen Pale Roar Referring Phys:  [id number] Rolfe Hartsell Diagnosing Phys: Timothy Gollan MD IMPRESSIONS  1. Left ventricular ejection fraction, by estimation, is 60 to 65%. Left ventricular ejection fraction by 2D MOD biplane is 54.5 %. The left ventricle has normal function. The left ventricle has no regional wall motion abnormalities. Left ventricular diastolic parameters were normal.  2. Right ventricular systolic function is normal. The right ventricular size is normal. There is moderately elevated pulmonary artery systolic pressure.  3. The mitral valve is normal in structure. Mild mitral valve regurgitation. No evidence of mitral stenosis.  4. The aortic valve is normal in structure. Aortic valve regurgitation is not visualized. No aortic stenosis is present.  5. The inferior vena cava is normal in size with greater than 50% respiratory variability, suggesting right atrial pressure of 3 mmHg. FINDINGS  Left Ventricle: Left ventricular ejection fraction, by estimation, is 60 to 65%. Left ventricular ejection fraction by 2D MOD biplane is 54.5  %. The left ventricle has normal function. The left ventricle has no regional wall motion abnormalities. Strain  was performed and the global longitudinal strain is indeterminate. The left ventricular internal cavity size was normal in size. There is no left ventricular hypertrophy. Left ventricular diastolic parameters were normal. Right Ventricle: The right ventricular size is normal. No increase in right ventricular wall thickness. Right ventricular systolic function is normal. There is moderately elevated pulmonary artery systolic pressure. The tricuspid regurgitant velocity is 3.46 m/s, and with an assumed right atrial pressure of 10 mmHg, the estimated right ventricular systolic pressure is 57.9 mmHg. Left Atrium: Left atrial size was normal in size. Right Atrium: Right atrial size was normal in size. Pericardium: Trivial pericardial effusion is present. Mitral Valve: The mitral valve is normal in structure. Mild mitral valve regurgitation. No evidence of mitral valve stenosis. MV peak gradient, 7.7 mmHg. The mean mitral valve gradient is 4.0 mmHg. Tricuspid Valve: The tricuspid valve is normal in structure. Tricuspid valve regurgitation is not demonstrated. No evidence of tricuspid stenosis. Aortic Valve: The aortic valve is normal in structure. Aortic valve regurgitation is not visualized. No aortic stenosis is present.  Aortic valve mean gradient measures 8.0 mmHg. Aortic valve peak gradient measures 14.0 mmHg. Aortic valve area, by VTI measures 2.70 cm. Pulmonic Valve: The pulmonic valve was normal in structure. Pulmonic valve regurgitation is mild. No evidence of pulmonic stenosis. Aorta: The aortic root is normal in size and structure. Venous: The inferior vena cava is normal in size with greater than 50% respiratory variability, suggesting right atrial pressure of 3 mmHg. IAS/Shunts: No atrial level shunt detected by color flow Doppler. Additional Comments: 3D was performed not requiring image post  processing on an independent workstation and was indeterminate. A device lead is visualized.  LEFT VENTRICLE PLAX 2D                        Biplane EF (MOD) LVIDd:         4.70 cm         LV Biplane EF:   Left LVIDs:         3.50 cm                          ventricular LV PW:         1.10 cm                          ejection LV IVS:        1.20 cm                          fraction by LVOT diam:     2.10 cm                          2D MOD LV SV:         110                              biplane is LV SV Index:   57                               54.5 %. LVOT Area:     3.46 cm                                Diastology                                LV e' medial:    12.60 cm/s LV Volumes (MOD)               LV E/e' medial:  9.9 LV vol d, MOD    106.0 ml      LV e' lateral:   12.80 cm/s A2C:                           LV E/e' lateral: 9.8 LV vol d, MOD    105.0 ml A4C: LV vol s, MOD    43.8 ml A2C: LV vol s, MOD    50.7 ml A4C: LV SV MOD A2C:   62.2 ml LV SV MOD A4C:   105.0 ml LV SV MOD BP:    58.3 ml RIGHT VENTRICLE  RV Basal diam:  4.10 cm RV Mid diam:    4.10 cm RV S prime:     19.00 cm/s TAPSE (M-mode): 3.1 cm LEFT ATRIUM             Index        RIGHT ATRIUM           Index LA diam:        3.90 cm 2.04 cm/m   RA Area:     27.60 cm LA Vol (A2C):   75.4 ml 39.37 ml/m  RA Volume:   95.80 ml  50.03 ml/m LA Vol (A4C):   63.5 ml 33.16 ml/m LA Biplane Vol: 70.4 ml 36.76 ml/m  AORTIC VALVE                     PULMONIC VALVE AV Area (Vmax):    3.19 cm      PV Vmax:          1.51 m/s AV Area (Vmean):   2.66 cm      PV Peak grad:     9.1 mmHg AV Area (VTI):     2.70 cm      PR End Diast Vel: 9.73 msec AV Vmax:           187.00 cm/s   RVOT Peak grad:   3 mmHg AV Vmean:          138.000 cm/s AV VTI:            0.406 m AV Peak Grad:      14.0 mmHg AV Mean Grad:      8.0 mmHg LVOT Vmax:         172.00 cm/s LVOT Vmean:        106.000 cm/s LVOT VTI:          0.317 m LVOT/AV VTI ratio: 0.78  AORTA Ao Root diam: 2.40 cm Ao Asc  diam:  2.60 cm MITRAL VALVE                TRICUSPID VALVE MV Area (PHT): 3.43 cm     TR Peak grad:   47.9 mmHg MV Area VTI:   2.28 cm     TR Vmax:        346.00 cm/s MV Peak grad:  7.7 mmHg MV Mean grad:  4.0 mmHg     SHUNTS MV Vmax:       1.39 m/s     Systemic VTI:  0.32 m MV Vmean:      94.3 cm/s    Systemic Diam: 2.10 cm MV Decel Time: 221 msec MV E velocity: 125.00 cm/s MV A velocity: 97.30 cm/s MV E/A ratio:  1.28 MV A Prime:    16.0 cm/s Belva Boyden MD Electronically signed by Belva Boyden MD Signature Date/Time: 01/19/2024/1:50:38 PM    Final     Scheduled Meds:  ambrisentan   10 mg Oral Daily   citalopram   40 mg Oral Daily   estradiol   2 mg Oral QHS   fluticasone   1 spray Each Nare Daily   furosemide   20 mg Intravenous Q12H   lactulose   20 g Oral TID   loratadine   10 mg Oral Daily   nicotine   21 mg Transdermal Daily   pantoprazole  (PROTONIX ) IV  40 mg Intravenous Q12H   Selexipag   1,200 mcg Oral BID   tadalafil  (PAH)  40 mg Oral Daily   Continuous Infusions:  cefTRIAXone  (ROCEPHIN )  IV 1 g (01/20/24  0003)     LOS: 2 days  MDM: Patient is high risk for one or more organ failure.  They necessitate ongoing hospitalization for continued IV therapies and subsequent lab monitoring. Total time spent interpreting labs and vitals, reviewing the medical record, coordinating care amongst consultants and care team members, directly assessing and discussing care with the patient and/or family: 55 min  Damieon Armendariz, DO [facility]  To contact the attending physician between 7A-7P please use Epic Chat. To contact the covering physician during after hours 7P-7A, please review Amion.  01/20/2024, 9:37 AM   *This document has been created with the assistance of dictation software. Please excuse typographical errors. *

## 2024-01-20 NOTE — Plan of Care (Signed)
  Problem: Education: Goal: Knowledge of General Education information will improve Description: Including pain rating scale, medication(s)/side effects and non-pharmacologic comfort measures Outcome: Progressing   Problem: Nutrition: Goal: Adequate nutrition will be maintained Outcome: Progressing   Problem: Coping: Goal: Level of anxiety will decrease Outcome: Progressing   Problem: Pain Managment: Goal: General experience of comfort will improve and/or be controlled Outcome: Progressing   Problem: Elimination: Goal: Will not experience complications related to bowel motility Outcome: Progressing Goal: Will not experience complications related to urinary retention Outcome: Progressing

## 2024-01-21 ENCOUNTER — Inpatient Hospital Stay

## 2024-01-21 DIAGNOSIS — I5032 Chronic diastolic (congestive) heart failure: Secondary | ICD-10-CM | POA: Diagnosis not present

## 2024-01-21 DIAGNOSIS — F418 Other specified anxiety disorders: Secondary | ICD-10-CM | POA: Diagnosis not present

## 2024-01-21 DIAGNOSIS — I2721 Secondary pulmonary arterial hypertension: Secondary | ICD-10-CM | POA: Diagnosis not present

## 2024-01-21 DIAGNOSIS — Z720 Tobacco use: Secondary | ICD-10-CM | POA: Diagnosis not present

## 2024-01-21 MED ORDER — ENOXAPARIN SODIUM 40 MG/0.4ML IJ SOSY
40.0000 mg | PREFILLED_SYRINGE | INTRAMUSCULAR | Status: DC
  Administered 2024-01-21 – 2024-01-25 (×5): 40 mg via SUBCUTANEOUS
  Filled 2024-01-21 (×5): qty 0.4

## 2024-01-21 NOTE — Progress Notes (Signed)
 PROGRESS NOTE    CERINA LEARY  AOZ:[id number] DOB: 1980-10-05 DOA: 01/17/2024 PCP: System, Provider Not In  Chief Complaint  Patient presents with   Abdominal Pain    Hospital Course:  ALIX LAHMANN is 43 y.o. 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  Patient reports abdominal pain and abdominal distention for 2 days prior to arrival.  Labs in the ED reveal hemoglobin of 10.8, down from 13.8 in November 2024.  Blood pressure 94/66, vital signs otherwise unremarkable.  Pelvic ultrasound reveals 4.5 cm hemorrhagic cyst in the right ovary.  OB/GYN was consulted and recommended serial CBCs to ensure hemoglobin remained stable.  Hemoglobin remained stable and eventually began uptrending to no acute surgical intervention deemed necessary.  Patient's abdominal swelling continued with subsequent worsening of pain.  She was started on IV Lasix  and diuresed well.  There was concern for ascites and abdominal ultrasound was performed which revealed no true ascites.  CT scan with body wall edema only.  Subjective: This morning patient endorses frustration.  She reports her abdomen is still very taut and tender.  She does report that she has been diuresing well but she is frustrated that her abdomen has not gone back to its normal size.  Objective: Vitals:   01/20/24 2017 01/21/24 0143 01/21/24 0500 01/21/24 0755  BP: 105/72 (!) 92/50  108/72  Pulse: 93 91  81  Resp: 16 18  18   Temp: 98.7 F (37.1 C) 98.6 F (37 C)  98.2 F (36.8 C)  TempSrc: Oral Oral    SpO2: 96% 92%  95%  Weight:   80 kg   Height:        Intake/Output Summary (Last 24 hours) at 01/21/2024 1634 Last data filed at 01/21/2024 1500 Gross per 24 hour  Intake 840 ml  Output 3650 ml  Net -2810  ml   Filed Weights   01/17/24 2100 01/19/24 0514 01/21/24 0500  Weight: 34.6 kg 77.8 kg 80 kg    Examination: General exam: Appears calm and comfortable, NAD  Respiratory system: No work of breathing, symmetric chest wall expansion Cardiovascular system: S1 & S2 heard, RRR.  Gastrointestinal system: +abdominal bloating, diffusely tender to palpation, most in the right lower quadrant Neuro: Alert and oriented. No focal neurological deficits. Extremities: Symmetric, no peripheral edema, expected range of motion. Skin: No rashes, lesions Psychiatry: Demonstrates appropriate judgement and insight. Mood & affect appropriate for situation.   Assessment & Plan:  Principal Problem:   Abdominal pain Active Problems:   Duodenitis   Adnexal cyst   UTI (urinary tract infection)   Kidney cysts   Pulmonary arterial hypertension (HCC)   Chronic diastolic CHF (congestive heart failure) (HCC)   HTN (hypertension)   Normocytic anemia   Hypokalemia   Tobacco use   Depression with anxiety   Hemorrhagic cyst of right ovary    Abdominal pain Abdominal swelling - Initially abdominal pain multifactorial from hemorrhagic cyst, duodenitis, and right-sided pyelonephritis. - Right-sided Pyelo now resolved - EDP discussed directly with Dr. Mauri Sous re Duodenitis, who reports no acute surgical intervention necessary - Hemorrhagic cyst appears to have stabilized.  No further downtrending of hemoglobin, no acute surgical intervention - Abdominal swelling remains to be an issue.  Initial CT scan with periportal and body wall edema.  Ordered Abdominal ultrasound today  reveals no significant ascites. - Patient is on IV Lasix  and is diuresing well.  Albumin 2.9.  Will obtain urine protein creatinine ratio to evaluate for nephrotic syndrome though notably patient does not have significant extremity edema or other signs of third spacing outside of this body wall edema.  On physical exam she does have notable  swelling of her abdomen. - Continue with as needed pain control  Hemorrhagic right ovarian cyst - As seen on pelvic ultrasound - Hemoglobin initially downtrending though stabilizing now. - Dr. Alvia Awkward of OB/GYN was consulted, no acute surgical interventions necessary given stability of hemoglobin.  Unlikely that pain is entirely caused by the cyst.  Patient has requested for oophorectomy, outpatient follow-up has been arranged -CA125 WNL  Duodenitis -- Stable - Some free fluid seen around descending duodenum/pancreatic head. - Continue with PPI twice daily  Pyelonephritis - Patient presented with abdominal pain, right-sided flank pain, temperature 100.1.  CT findings consistent with ascending UTI.  Urine cultures now with 100,000 CFU E. coli, does have some resistance - Continue ceftriaxone  for now (4/18-4/25), will plan to discharge with cefpodoxime. - Right flank pain is resolved - Continue to trend CBC.  No leukocytosis  Pulmonary arterial hypertension Pacemaker Present - Continue with home meds: Ambrisentan , tadalafil , selexipag  - Repeat EF without obvious acute worsening - Needs close outpatient follow-up with pulmonology and cardiology  Chronic diastolic heart failure - Most recent echocardiogram: EF over 55%,  Pulmonary arterial hypertension. - BNP is less than 100  - Repeat echocardiogram appears similar to prior - Continue to follow strict I's and O's - Continue with IV Lasix  for now, titrate as blood pressure allows - Not a great candidate for midodrine to allow diuresis given PAH   - Monitor volume status closely  Hypertension -- BPS low on current meds. Hold dilt to allow for diuresis - Continue close monitoring  Normocytic anemia - Hemoglobin 10.8 on arrival, most recently 13.8 in November 2024. - Blood loss likely may be secondary to hemorrhagic ovarian cysts, though unlikely to be entire reason.   -Iron panel reveals low TSAT, may be some component of iron  deficiency at play.  Last colonoscopy in 2022 with large polyp resected, also history of esophagitis seen on EGD.  No overt bleeding at current. - Additionally downtrend may be secondary to hemodilution given patient's acute volume overload.  I suspect it is nearing a true her baseline now. - Continue to monitor hemoglobin closely.  Hypokalemia - Replace as needed  Tobacco abuse - Nicotine  patch  Depression with anxiety - Resume home meds  Splenomegaly - 15.1 cm in craniocaudal dimension.  Incidentally seen on CT.  Left kidney cyst - Incidentally seen on CT.  2.3 cm cystic lesion in posterior left kidney - Will require renal protocol MRI with and without contrast.  Can be performed nonurgently outpatient.  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  Continue to monitor closely for electrolyte abnormalities.  Replace as needed  Headache - PRN sumatriptan   Upper respiratory symptoms, resolved - Respiratory viral panel negatuve - Flonase , saline spray, eyedrops as needed  DVT prophylaxis: Lovenox    Code Status: Full Code Disposition: Inpatient, still hospitalized for diruesis.  Will eventually discharge home with family   Consultants:  General surgery Gynecology  Procedures:    Antimicrobials:  Anti-infectives (From admission, onward)    Start     Dose/Rate Route Frequency Ordered Stop   01/20/24 0000  cefTRIAXone  (ROCEPHIN ) 1 g in sodium chloride  0.9 % 100 mL  IVPB        1 g 200 mL/hr over 30 Minutes Intravenous Every 24 hours 01/19/24 1600 01/24/24 2359   01/18/24 2000  cefTRIAXone  (ROCEPHIN ) 1 g in sodium chloride  0.9 % 100 mL IVPB  Status:  Discontinued        1 g 200 mL/hr over 30 Minutes Intravenous Every 24 hours 01/17/24 2029 01/18/24 0257   01/18/24 0800  cefoTEtan  (CEFOTAN ) 1 g in sodium chloride  0.9 % 100 mL IVPB  Status:  Discontinued        1 g 200 mL/hr over 30 Minutes Intravenous Every 12 hours 01/18/24 0257 01/19/24 1600   01/17/24 1900  cefTRIAXone   (ROCEPHIN ) 1 g in sodium chloride  0.9 % 100 mL IVPB        1 g 200 mL/hr over 30 Minutes Intravenous  Once 01/17/24 1845 01/17/24 1959       Data Reviewed: I have personally reviewed following labs and imaging studies CBC: Recent Labs  Lab 01/17/24 1258 01/18/24 0858 01/18/24 1344 01/19/24 1020 01/20/24 1002  WBC 4.4 4.6 4.1 3.2* 3.6*  NEUTROABS  --   --  2.3 1.8 2.2  HGB 10.8* 9.5* 9.7* 9.5* 11.1*  HCT 32.1* 28.4* 28.7* 28.5* 33.0*  MCV 100.0 96.9 98.6 98.3 96.2  PLT 132* 128* 113* 130* 161   Basic Metabolic Panel: Recent Labs  Lab 01/17/24 1258 01/17/24 2130 01/18/24 0317 01/19/24 1020 01/20/24 1002  NA 135  --  133* 135 138  K 3.2*  --  3.8 3.7 3.6  CL 112*  --  107 108 105  CO2 19*  --  22 20* 26  GLUCOSE 105*  --  98 78 149*  BUN 11  --  9 5* 6  CREATININE 0.71  --  0.60 0.65 0.69  CALCIUM 8.7*  --  8.3* 8.1* 8.8*  MG  --  1.9  --   --   --   PHOS  --  1.9*  --   --   --    GFR: Estimated Creatinine Clearance: 100.6 mL/min (by C-G formula based on SCr of 0.69 mg/dL). Liver Function Tests: Recent Labs  Lab 01/17/24 1258 01/17/24 2130 01/19/24 1020 01/20/24 1002  AST 22 28 16 15   ALT 27 31 21 22   ALKPHOS 93 117 95 115  BILITOT 0.6 0.4 0.5 0.5  PROT 5.7* 5.8* 5.2* 5.8*  ALBUMIN 3.1* 3.1* 2.7* 2.9*   CBG: No results for input(s): "GLUCAP" in the last 168 hours.  Recent Results (from the past 240 hours)  Urine Culture     Status: Abnormal   Collection Time: 01/17/24 12:39 PM   Specimen: Urine, Clean Catch  Result Value Ref Range Status   Specimen Description   Final    URINE, CLEAN CATCH Performed at [facility], [address], [place], [place] [address]    Special Requests   Final    NONE Performed at [facility], [address] [place], [place] [address]    Culture >=100,000 COLONIES/mL ESCHERICHIA COLI (A)  Final   Report Status 01/19/2024 FINAL  Final   Organism ID, Bacteria ESCHERICHIA COLI (A)  Final       Susceptibility   Escherichia coli - MIC*    AMPICILLIN >=32 RESISTANT Resistant     CEFAZOLIN  <=4 SENSITIVE Sensitive     CEFTRIAXONE  <=0.25 SENSITIVE Sensitive     CIPROFLOXACIN <=0.25 SENSITIVE Sensitive     GENTAMICIN >=16 RESISTANT Resistant     IMIPENEM <=0.25  SENSITIVE Sensitive     NITROFURANTOIN <=16 SENSITIVE Sensitive     TRIMETH/SULFA <=20 SENSITIVE Sensitive     AMPICILLIN/SULBACTAM >=32 RESISTANT Resistant     * >=100,000 COLONIES/mL ESCHERICHIA COLI  Culture, blood (Routine X 2) w Reflex to ID Panel     Status: None (Preliminary result)   Collection Time: 01/17/24 10:29 PM   Specimen: BLOOD  Result Value Ref Range Status   Specimen Description BLOOD BLOOD LEFT HAND  Final   Special Requests   Final    BOTTLES DRAWN AEROBIC AND ANAEROBIC Blood Culture adequate volume   Culture   Final    NO GROWTH 4 DAYS Performed at [facility], [address], [place], [place] [address]    Report Status PENDING  Incomplete  Culture, blood (Routine X 2) w Reflex to ID Panel     Status: None (Preliminary result)   Collection Time: 01/17/24 10:33 PM   Specimen: BLOOD  Result Value Ref Range Status   Specimen Description BLOOD BLOOD RIGHT ARM  Final   Special Requests   Final    BOTTLES DRAWN AEROBIC AND ANAEROBIC Blood Culture adequate volume   Culture   Final    NO GROWTH 4 DAYS Performed at [facility], [address] [place], [place] [address]    Report Status PENDING  Incomplete  Resp panel by RT-PCR (RSV, Flu A&B, Covid) Anterior Nasal Swab     Status: None   Collection Time: 01/18/24 12:29 PM   Specimen: Anterior Nasal Swab  Result Value Ref Range Status   SARS Coronavirus 2 by RT PCR NEGATIVE NEGATIVE Final    Comment: (NOTE) SARS-CoV-2 target nucleic acids are NOT DETECTED.  The SARS-CoV-2 RNA is generally detectable in upper respiratory specimens during the acute phase of infection. The lowest concentration of SARS-CoV-2 viral copies this assay  can detect is 138 copies/mL. A negative result does not preclude SARS-Cov-2 infection and should not be used as the sole basis for treatment or other patient management decisions. A negative result may occur with  improper specimen collection/handling, submission of specimen other than nasopharyngeal swab, presence of viral mutation(s) within the areas targeted by this assay, and inadequate number of viral copies(<138 copies/mL). A negative result must be combined with clinical observations, patient history, and epidemiological information. The expected result is Negative.  Fact Sheet for Patients:  BloggerCourse.com  Fact Sheet for Healthcare Providers:  SeriousBroker.it  This test is no t yet approved or cleared by the [place]  FDA and  has been authorized for detection and/or diagnosis of SARS-CoV-2 by FDA under an Emergency Use Authorization (EUA). This EUA will remain  in effect (meaning this test can be used) for the duration of the COVID-19 declaration under Section 564(b)(1) of the Act, 21 U.S.C.section 360bbb-3(b)(1), unless the authorization is terminated  or revoked sooner.       Influenza A by PCR NEGATIVE NEGATIVE Final   Influenza B by PCR NEGATIVE NEGATIVE Final    Comment: (NOTE) The Xpert Xpress SARS-CoV-2/FLU/RSV plus assay is intended as an aid in the diagnosis of influenza from Nasopharyngeal swab specimens and should not be used as a sole basis for treatment. Nasal washings and aspirates are unacceptable for Xpert Xpress SARS-CoV-2/FLU/RSV testing.  Fact Sheet for Patients: BloggerCourse.com  Fact Sheet for Healthcare Providers: SeriousBroker.it  This test is not yet approved or cleared by the [place]  FDA and has been authorized for detection and/or diagnosis of SARS-CoV-2 by FDA under an Emergency Use  Authorization (EUA). This EUA will  remain in effect (meaning this test can be used) for the duration of the COVID-19 declaration under Section 564(b)(1) of the Act, 21 U.S.C. section 360bbb-3(b)(1), unless the authorization is terminated or revoked.     Resp Syncytial Virus by PCR NEGATIVE NEGATIVE Final    Comment: (NOTE) Fact Sheet for Patients: BloggerCourse.com  Fact Sheet for Healthcare Providers: SeriousBroker.it  This test is not yet approved or cleared by the [place]  FDA and has been authorized for detection and/or diagnosis of SARS-CoV-2 by FDA under an Emergency Use Authorization (EUA). This EUA will remain in effect (meaning this test can be used) for the duration of the COVID-19 declaration under Section 564(b)(1) of the Act, 21 U.S.C. section 360bbb-3(b)(1), unless the authorization is terminated or revoked.  Performed at [facility], [address] [place], [place] [address]      Radiology Studies: US  ASCITES (ABDOMEN LIMITED) Result Date: 01/21/2024 CLINICAL DATA:  Ascites EXAM: LIMITED ABDOMEN ULTRASOUND FOR ASCITES TECHNIQUE: Limited ultrasound survey for ascites was performed in all four abdominal quadrants. COMPARISON:  CT 01/17/2024 FINDINGS: No ascites seen in the 4 quadrants of the abdomen at this time by ultrasound. Question tiny left pleural effusion. IMPRESSION: No significant ascites seen in the abdomen by ultrasound. Overlapping bowel gas identified. Electronically Signed   By: Adrianna Horde M.D.   On: 01/21/2024 15:05    Scheduled Meds:  ambrisentan   10 mg Oral Daily   citalopram   40 mg Oral Daily   enoxaparin  (LOVENOX ) injection  40 mg Subcutaneous Q24H   estradiol   2 mg Oral QHS   fluticasone   1 spray Each Nare Daily   furosemide   20 mg Intravenous Q12H   lactulose   20 g Oral TID   nicotine   21 mg Transdermal Daily   pantoprazole  (PROTONIX ) IV  40 mg Intravenous Q12H   Selexipag   1,200 mcg Oral BID   tadalafil   (PAH)  40 mg Oral Daily   Continuous Infusions:  cefTRIAXone  (ROCEPHIN )  IV 1 g (01/20/24 2245)     LOS: 3 days  MDM: Patient is high risk for one or more organ failure.  They necessitate ongoing hospitalization for continued IV therapies and subsequent lab monitoring. Total time spent interpreting labs and vitals, reviewing the medical record, coordinating care amongst consultants and care team members, directly assessing and discussing care with the patient and/or family: 55 min  Shantera Monts, DO [facility]  To contact the attending physician between 7A-7P please use Epic Chat. To contact the covering physician during after hours 7P-7A, please review Amion.  01/21/2024, 4:34 PM   *This document has been created with the assistance of dictation software. Please excuse typographical errors. *

## 2024-01-21 NOTE — TOC CM/SW Note (Signed)
 Transition of Care [facility]) - Inpatient Brief Assessment   Patient Details  Name: Rachel Wall MRN: [id number] Date of Birth: 03-Aug-1981  Transition of [facility]) CM/SW Contact:    Odilia Bennett, LCSW Phone Number: 01/21/2024, 2:14 PM   Clinical Narrative: CSW has reviewed chart. No TOC needs identified. CSW will continue to follow progress. Please place [facility] consult if any needs arise.  Transition of Care Asessment: Insurance and Status: Insurance coverage has been reviewed Patient has primary care physician: Yes Home environment has been reviewed: Single family home Prior level of function:: Not documented Prior/Current Home Services: No current home services Social Drivers of Health Review: SDOH reviewed no interventions necessary Readmission risk has been reviewed: Yes Transition of care needs: no transition of care needs at this time

## 2024-01-21 NOTE — Plan of Care (Signed)
  Problem: Skin Integrity: Goal: Risk for impaired skin integrity will decrease Outcome: Progressing   Problem: Pain Managment: Goal: General experience of comfort will improve and/or be controlled Outcome: Progressing   Problem: Nutrition: Goal: Adequate nutrition will be maintained Outcome: Progressing   Problem: Education: Goal: Knowledge of General Education information will improve Description: Including pain rating scale, medication(s)/side effects and non-pharmacologic comfort measures Outcome: Progressing   Problem: Health Behavior/Discharge Planning: Goal: Ability to manage health-related needs will improve Outcome: Progressing   Problem: Clinical Measurements: Goal: Ability to maintain clinical measurements within normal limits will improve Outcome: Progressing Goal: Will remain free from infection Outcome: Progressing Goal: Diagnostic test results will improve Outcome: Progressing Goal: Respiratory complications will improve Outcome: Progressing Goal: Cardiovascular complication will be avoided Outcome: Progressing   Problem: Activity: Goal: Risk for activity intolerance will decrease Outcome: Progressing   Problem: Nutrition: Goal: Adequate nutrition will be maintained Outcome: Progressing   Problem: Coping: Goal: Level of anxiety will decrease Outcome: Progressing   Problem: Elimination: Goal: Will not experience complications related to bowel motility Outcome: Progressing Goal: Will not experience complications related to urinary retention Outcome: Progressing   Problem: Pain Managment: Goal: General experience of comfort will improve and/or be controlled Outcome: Progressing   Problem: Safety: Goal: Ability to remain free from injury will improve Outcome: Progressing   Problem: Skin Integrity: Goal: Risk for impaired skin integrity will decrease Outcome: Progressing

## 2024-01-21 NOTE — Care Management Important Message (Signed)
 Important Message  Patient Details  Name: Rachel Wall MRN: [id number] Date of Birth: Sep 22, 1981   Important Message Given:  Yes - Medicare IM     Anise Kerns 01/21/2024, 10:16 AM

## 2024-01-22 DIAGNOSIS — I5033 Acute on chronic diastolic (congestive) heart failure: Secondary | ICD-10-CM

## 2024-01-22 DIAGNOSIS — R1031 Right lower quadrant pain: Secondary | ICD-10-CM

## 2024-01-22 DIAGNOSIS — N949 Unspecified condition associated with female genital organs and menstrual cycle: Secondary | ICD-10-CM | POA: Diagnosis not present

## 2024-01-22 DIAGNOSIS — N3001 Acute cystitis with hematuria: Secondary | ICD-10-CM

## 2024-01-22 DIAGNOSIS — K2980 Duodenitis without bleeding: Secondary | ICD-10-CM | POA: Diagnosis not present

## 2024-01-22 LAB — CULTURE, BLOOD (ROUTINE X 2)
Culture: NO GROWTH
Culture: NO GROWTH
Special Requests: ADEQUATE
Special Requests: ADEQUATE

## 2024-01-22 MED ORDER — FUROSEMIDE 10 MG/ML IJ SOLN
40.0000 mg | Freq: Two times a day (BID) | INTRAMUSCULAR | Status: DC
  Administered 2024-01-22 – 2024-01-25 (×8): 40 mg via INTRAVENOUS
  Filled 2024-01-22 (×8): qty 4

## 2024-01-22 MED ORDER — DILTIAZEM HCL ER 60 MG PO CP12
120.0000 mg | ORAL_CAPSULE | Freq: Two times a day (BID) | ORAL | Status: DC
  Administered 2024-01-22 – 2024-01-26 (×9): 120 mg via ORAL
  Filled 2024-01-22 (×9): qty 2

## 2024-01-22 MED ORDER — SPIRONOLACTONE 25 MG PO TABS
25.0000 mg | ORAL_TABLET | Freq: Every day | ORAL | Status: DC
  Administered 2024-01-22: 25 mg via ORAL
  Filled 2024-01-22: qty 1

## 2024-01-22 MED ORDER — SPIRONOLACTONE 25 MG PO TABS
50.0000 mg | ORAL_TABLET | Freq: Every day | ORAL | Status: DC
  Administered 2024-01-23 – 2024-01-26 (×4): 50 mg via ORAL
  Filled 2024-01-22 (×4): qty 2

## 2024-01-22 NOTE — Assessment & Plan Note (Addendum)
 Patient was diuresed with 40 mg IV twice daily of Lasix  and switched over to oral 40 mg twice daily upon going home.  Continue spironolactone  dose.  Her blood pressure is normally on the lower side.  Patient states her normal weight is around 143.  Weight 153 today.  Continue Lasix  40 mg twice daily at home and then hopefully can cut back to once a day once weight is down.

## 2024-01-22 NOTE — Assessment & Plan Note (Addendum)
 Replaced

## 2024-01-22 NOTE — Hospital Course (Addendum)
 43 y.o. 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  Patient reports abdominal pain and abdominal distention for 2 days prior to arrival.  Labs in the ED reveal hemoglobin of 10.8, down from 13.8 in November 2024.  Blood pressure 94/66, vital signs otherwise unremarkable.  Pelvic ultrasound reveals 4.5 cm hemorrhagic cyst in the right ovary.  OB/GYN was consulted and recommended serial CBCs to ensure hemoglobin remained stable.  Hemoglobin remained stable and eventually began uptrending to no acute surgical intervention deemed necessary.  Patient's abdominal swelling continued with subsequent worsening of pain.  She was started on IV Lasix  and diuresed well.  There was concern for ascites and abdominal ultrasound was performed which revealed no true ascites.  CT scan with body wall edema only.   4/23.  Patient states her normal weight is 143.  She is up at 175.93.  Increase diuresis to 40 mg IV twice daily.  Patient states her swelling is mostly in the abdomen.  Restart low-dose spironolactone .  Her blood pressure is normally on the lower side. 4/24.  Weight down to 151 pounds.  Continue diuresis.  Patient still feeling miserable with her abdominal pain. 4/25.  Weight down to 150.6 pounds.  Continue diuresis.  CT renal scan shows that the right renal edema and urothelial thickening of the proximal right ureter is more difficult to appreciate on this unenhanced study, no calculi or obstructive uropathy within either kidney, large amount of retained stool throughout the colon consistent with constipation, normal appendix, interval resolution of the right adnexal cyst seen previously, left Bosniak benign renal cyst measuring 2.3 cm. 4/26.  Patient's weight down to 150.1 pounds.   Normally weighs 143.  Patient having some pain at her right sacroiliac joint and dose of Solu-Medrol ordered. 4/27.  Patient wants to go home today.  Patient still has some abdominal pain.  Tapering dose of oxycodone  prescribed.  Patient asked for a few pills of Xanax  also.  Prescribed a prednisone  taper.

## 2024-01-22 NOTE — Plan of Care (Signed)

## 2024-01-22 NOTE — Assessment & Plan Note (Addendum)
 Previous CT scans reviewed.  Patient has resolution of the previous ovarian cyst so I am wondering if she had an ovarian cyst rupture which is causing her pain.  Trial of gabapentin  see if that helps out her pain.  This patient also having pain in the right sacroiliac joint will give a a prednisone  taper also.  Since the patient was requiring a lot of pain medications during the hospital course I will have to taper down oxycodone  from 10 mg down to 5 mg and taper the frequency.  Then can convert to Tylenol  after that

## 2024-01-22 NOTE — Assessment & Plan Note (Addendum)
 On Protonix  daily

## 2024-01-22 NOTE — Progress Notes (Signed)
 Progress Note   Patient: Rachel Wall [id number] DOB: Mar 26, 1981 DOA: 01/17/2024     4 DOS: the patient was seen and examined on 01/22/2024   Brief hospital course: 43 y.o. 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  Patient reports abdominal pain and abdominal distention for 2 days prior to arrival.  Labs in the ED reveal hemoglobin of 10.8, down from 13.8 in November 2024.  Blood pressure 94/66, vital signs otherwise unremarkable.  Pelvic ultrasound reveals 4.5 cm hemorrhagic cyst in the right ovary.  OB/GYN was consulted and recommended serial CBCs to ensure hemoglobin remained stable.  Hemoglobin remained stable and eventually began uptrending to no acute surgical intervention deemed necessary.  Patient's abdominal swelling continued with subsequent worsening of pain.  She was started on IV Lasix  and diuresed well.  There was concern for ascites and abdominal ultrasound was performed which revealed no true ascites.  CT scan with body wall edema only.   4/23.  Patient states her normal weight is 143.  She is up at 175.93.  Increase diuresis to 40 mg IV twice daily.  Patient states her swelling is mostly in the abdomen.  Restart low-dose spironolactone .  Her blood pressure is normally on the lower side.  Assessment and Plan: * Acute on chronic diastolic CHF (congestive heart failure) (HCC) Increase diuresis to 40 mg Lasix  IV twice daily.  Restart low-dose spironolactone .  Her blood pressure is normally on the lower side.  Patient states her normal weight is around 143.  Current weight 175.  Abdominal pain As needed pain medication.  Previous CT scan reviewed.  Duodenitis On Protonix  twice a day  Adnexal cyst 4.5 cm right adnexal cyst with small amount of  free fluid in the pelvis.  Follow-up with gynecology as outpatient.  UTI (urinary tract infection) Will complete 5 days of Rocephin   Kidney cysts Follow-up as outpatient  Pulmonary arterial hypertension (HCC) Continue Ambrisentan , selexipag  and Adcirca   HTN (hypertension) Restart Cardizem  CD.  Her blood pressure is on the lower side.  Normocytic anemia Last hemoglobin 11.1  Hypokalemia Replace potassium  Tobacco use Nicotine  patch  Depression with anxiety Celexa         Subjective: Patient still feeling abdominal pain and bloating and swelling.  Not feeling well at all.  Wants to get rid of more fluid.  Normal weight around 143.  Current weight 175.  Physical Exam: Vitals:   01/22/24 0332 01/22/24 0500 01/22/24 0855 01/22/24 0923  BP: (!) 90/53  (!) 108/53 114/69  Pulse: 78   71  Resp: 18   16  Temp: 98.2 F (36.8 C)   98.3 F (36.8 C)  TempSrc: Oral     SpO2: 98%   99%  Weight:  79.8 kg    Height:       Physical Exam HENT:     Head: Normocephalic.  Eyes:     General: Lids are normal.     Conjunctiva/sclera: Conjunctivae normal.  Cardiovascular:     Rate and Rhythm: Normal rate and regular rhythm.     Heart sounds: S1 normal and S2 normal. Murmur heard.     Systolic murmur is present with a grade of 2/6.  Pulmonary:     Breath sounds: No decreased breath sounds, wheezing, rhonchi or rales.  Abdominal:     General:  There is distension.     Palpations: Abdomen is soft.     Tenderness: There is abdominal tenderness in the right lower quadrant.  Musculoskeletal:     Right lower leg: No swelling.     Left lower leg: No swelling.  Skin:    General: Skin is warm.     Findings: No rash.  Neurological:     Mental Status: She is alert and oriented to person, place, and time.     Data Reviewed: Creatinine 0.69, white blood count 3.6, hemoglobin 11.1, platelet count 161   Disposition: Status is: Inpatient Remains inpatient appropriate because:  Continue IV diuresis  Planned Discharge Destination: Home    Time spent: 29 minutes  Author: Verla Glaze, MD 01/22/2024 3:30 PM  For on call review [email or web address].

## 2024-01-22 NOTE — Assessment & Plan Note (Signed)
 Radiologist commented that this kidney cyst is benign

## 2024-01-22 NOTE — Assessment & Plan Note (Signed)
 Continue Ambrisentan , selexipag  and Adcirca 

## 2024-01-22 NOTE — Assessment & Plan Note (Signed)
 Last hemoglobin 12.9.

## 2024-01-22 NOTE — Assessment & Plan Note (Signed)
 Will complete 5 days of Rocephin 

## 2024-01-22 NOTE — Assessment & Plan Note (Signed)
Continue Cardizem CD 

## 2024-01-22 NOTE — Assessment & Plan Note (Signed)
Celexa

## 2024-01-22 NOTE — Plan of Care (Signed)
  Problem: Nutrition: Goal: Adequate nutrition will be maintained Outcome: Progressing   Problem: Coping: Goal: Level of anxiety will decrease Outcome: Progressing   Problem: Pain Managment: Goal: General experience of comfort will improve and/or be controlled Outcome: Progressing   Problem: Safety: Goal: Ability to remain free from injury will improve Outcome: Progressing

## 2024-01-22 NOTE — Assessment & Plan Note (Addendum)
 4.5 cm right adnexal cyst with small amount of free fluid in the pelvis.  Repeat CT scan does not see the cyst.  So I am wondering if the cyst had ruptured which is causing her abdominal pain.  Follow-up with gynecology as outpatient.

## 2024-01-22 NOTE — Assessment & Plan Note (Signed)
 Nicotine patch

## 2024-01-23 ENCOUNTER — Other Ambulatory Visit: Payer: Self-pay | Admitting: Obstetrics & Gynecology

## 2024-01-23 DIAGNOSIS — R1031 Right lower quadrant pain: Secondary | ICD-10-CM | POA: Diagnosis not present

## 2024-01-23 DIAGNOSIS — N949 Unspecified condition associated with female genital organs and menstrual cycle: Secondary | ICD-10-CM | POA: Diagnosis not present

## 2024-01-23 DIAGNOSIS — N3000 Acute cystitis without hematuria: Secondary | ICD-10-CM | POA: Diagnosis not present

## 2024-01-23 DIAGNOSIS — Z780 Asymptomatic menopausal state: Secondary | ICD-10-CM

## 2024-01-23 DIAGNOSIS — I5033 Acute on chronic diastolic (congestive) heart failure: Secondary | ICD-10-CM | POA: Diagnosis not present

## 2024-01-23 LAB — BASIC METABOLIC PANEL WITH GFR
Anion gap: 6 (ref 5–15)
BUN: 14 mg/dL (ref 6–20)
CO2: 28 mmol/L (ref 22–32)
Calcium: 9.2 mg/dL (ref 8.9–10.3)
Chloride: 105 mmol/L (ref 98–111)
Creatinine, Ser: 0.73 mg/dL (ref 0.44–1.00)
GFR, Estimated: >60 mL/min (ref >60)
Glucose, Bld: 104 mg/dL — ABNORMAL HIGH (ref 70–99)
Potassium: 4.4 mmol/L (ref 3.5–5.1)
Sodium: 139 mmol/L (ref 135–145)

## 2024-01-23 MED ORDER — ALPRAZOLAM 0.5 MG PO TABS
1.0000 mg | ORAL_TABLET | Freq: Two times a day (BID) | ORAL | Status: DC | PRN
  Administered 2024-01-23 – 2024-01-26 (×7): 1 mg via ORAL
  Filled 2024-01-23 (×9): qty 2

## 2024-01-23 MED ORDER — MAGNESIUM SULFATE 2 GM/50ML IV SOLN
2.0000 g | Freq: Once | INTRAVENOUS | Status: AC
  Administered 2024-01-23: 2 g via INTRAVENOUS
  Filled 2024-01-23: qty 50

## 2024-01-23 MED ORDER — POTASSIUM CHLORIDE CRYS ER 20 MEQ PO TBCR
20.0000 meq | EXTENDED_RELEASE_TABLET | Freq: Once | ORAL | Status: AC
  Administered 2024-01-23: 20 meq via ORAL
  Filled 2024-01-23: qty 1

## 2024-01-23 MED ORDER — OXYCODONE HCL 5 MG PO TABS
10.0000 mg | ORAL_TABLET | ORAL | Status: DC | PRN
  Administered 2024-01-23 – 2024-01-26 (×10): 10 mg via ORAL
  Filled 2024-01-23 (×11): qty 2

## 2024-01-23 MED ORDER — HYDROMORPHONE HCL 1 MG/ML IJ SOLN
0.5000 mg | Freq: Four times a day (QID) | INTRAMUSCULAR | Status: DC | PRN
  Administered 2024-01-24 – 2024-01-26 (×4): 0.5 mg via INTRAVENOUS
  Filled 2024-01-23 (×4): qty 0.5

## 2024-01-23 NOTE — Plan of Care (Signed)

## 2024-01-23 NOTE — Progress Notes (Signed)
 Progress Note   Patient: Rachel Wall:[id number] DOB: 12-Dec-1980 DOA: 01/17/2024     5 DOS: the patient was seen and examined on 01/23/2024   Brief hospital course: 43 y.o. 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  Patient reports abdominal pain and abdominal distention for 2 days prior to arrival.  Labs in the ED reveal hemoglobin of 10.8, down from 13.8 in November 2024.  Blood pressure 94/66, vital signs otherwise unremarkable.  Pelvic ultrasound reveals 4.5 cm hemorrhagic cyst in the right ovary.  OB/GYN was consulted and recommended serial CBCs to ensure hemoglobin remained stable.  Hemoglobin remained stable and eventually began uptrending to no acute surgical intervention deemed necessary.  Patient's abdominal swelling continued with subsequent worsening of pain.  She was started on IV Lasix  and diuresed well.  There was concern for ascites and abdominal ultrasound was performed which revealed no true ascites.  CT scan with body wall edema only.   4/23.  Patient states her normal weight is 143.  She is up at 175.93.  Increase diuresis to 40 mg IV twice daily.  Patient states her swelling is mostly in the abdomen.  Restart low-dose spironolactone .  Her blood pressure is normally on the lower side. 4/24.  Weight down to 151 pounds.  Continue diuresis.  Patient still feeling miserable with her abdominal pain.  Assessment and Plan: * Acute on chronic diastolic CHF (congestive heart failure) (HCC) Continue diuresis to 40 mg Lasix  IV twice daily.  Increase spironolactone  dose.  Her blood pressure is normally on the lower side.  Patient states her normal weight is around 143.  Weight down to 151 today.  Abdominal pain As needed pain medication.  Increase  oxycodone  to 10 mg every 4 as needed.  Previous CT scan reviewed and pelvic ultrasound reviewed.  Duodenitis On Protonix  twice a day  Adnexal cyst 4.5 cm right adnexal cyst with small amount of free fluid in the pelvis.  Patient requesting gynecology reconsult.  Messaged gynecologist that saw her while here.  UTI (urinary tract infection) On Rocephin  until completion  Kidney cysts Follow-up as outpatient  Pulmonary arterial hypertension (HCC) Continue Ambrisentan , selexipag  and Adcirca   HTN (hypertension) Continue Cardizem  CD.    Normocytic anemia Last hemoglobin 11.1  Hypokalemia Replace while on Lasix   Tobacco use Nicotine  patch  Depression with anxiety Celexa         Subjective: Patient still having a lot of abdominal pain.  Patient states bowel movements are fine.  Initially admitted with abdominal pain.  Physical Exam: Vitals:   01/22/24 1750 01/22/24 1956 01/23/24 0500 01/23/24 0538  BP: 100/66 95/71  119/77  Pulse: 66 65  67  Resp: 17 20  18   Temp: 98.1 F (36.7 C) 98.3 F (36.8 C)  98.3 F (36.8 C)  TempSrc: Oral Oral  Oral  SpO2: 99% 98%  100%  Weight:   68.5 kg   Height:       Physical Exam HENT:     Head: Normocephalic.  Eyes:     General: Lids are normal.     Conjunctiva/sclera: Conjunctivae normal.  Cardiovascular:     Rate and Rhythm: Normal rate and regular rhythm.     Heart sounds: S1 normal and S2 normal. Murmur heard.     Systolic murmur is present with a grade  of 2/6.  Pulmonary:     Breath sounds: No decreased breath sounds, wheezing, rhonchi or rales.  Abdominal:     General: There is distension.     Palpations: Abdomen is soft.     Tenderness: There is abdominal tenderness in the right lower quadrant.  Musculoskeletal:     Right lower leg: No swelling.     Left lower leg: No swelling.  Skin:    General: Skin is warm.     Findings: No rash.  Neurological:     Mental Status: She is alert and oriented to person, place, and  time.     Data Reviewed: Creatinine 0.73, potassium 4.4  Disposition: Status is: Inpatient Remains inpatient appropriate because: Patient still in a lot of pain.  Asking for gynecology reconsult.  Planned Discharge Destination: Home    Time spent: 28 minutes  Author: Verla Glaze, MD 01/23/2024 3:49 PM  For on call review [email or web address].

## 2024-01-24 ENCOUNTER — Inpatient Hospital Stay

## 2024-01-24 DIAGNOSIS — I5033 Acute on chronic diastolic (congestive) heart failure: Secondary | ICD-10-CM | POA: Diagnosis not present

## 2024-01-24 DIAGNOSIS — R1031 Right lower quadrant pain: Secondary | ICD-10-CM | POA: Diagnosis not present

## 2024-01-24 DIAGNOSIS — N949 Unspecified condition associated with female genital organs and menstrual cycle: Secondary | ICD-10-CM | POA: Diagnosis not present

## 2024-01-24 DIAGNOSIS — K2980 Duodenitis without bleeding: Secondary | ICD-10-CM | POA: Diagnosis not present

## 2024-01-24 DIAGNOSIS — K5900 Constipation, unspecified: Secondary | ICD-10-CM | POA: Insufficient documentation

## 2024-01-24 LAB — CBC
HCT: 37.2 % (ref 36.0–46.0)
Hemoglobin: 12.9 g/dL (ref 12.0–15.0)
MCH: 32.7 pg (ref 26.0–34.0)
MCHC: 34.7 g/dL (ref 30.0–36.0)
MCV: 94.4 fL (ref 80.0–100.0)
Platelets: 284 10*3/uL (ref 150–400)
RBC: 3.94 MIL/uL (ref 3.87–5.11)
RDW: 13.2 % (ref 11.5–15.5)
WBC: 6.3 10*3/uL (ref 4.0–10.5)
nRBC: 0 % (ref 0.0–0.2)

## 2024-01-24 LAB — BASIC METABOLIC PANEL WITH GFR
Anion gap: 4 — ABNORMAL LOW (ref 5–15)
BUN: 17 mg/dL (ref 6–20)
CO2: 29 mmol/L (ref 22–32)
Calcium: 9.6 mg/dL (ref 8.9–10.3)
Chloride: 103 mmol/L (ref 98–111)
Creatinine, Ser: 0.75 mg/dL (ref 0.44–1.00)
GFR, Estimated: >60 mL/min (ref >60)
Glucose, Bld: 99 mg/dL (ref 70–99)
Potassium: 4.3 mmol/L (ref 3.5–5.1)
Sodium: 136 mmol/L (ref 135–145)

## 2024-01-24 LAB — VITAMIN B1: Vitamin B1 (Thiamine): 128.7 nmol/L (ref 66.5–200.0)

## 2024-01-24 MED ORDER — GABAPENTIN 100 MG PO CAPS
200.0000 mg | ORAL_CAPSULE | Freq: Two times a day (BID) | ORAL | Status: DC
  Administered 2024-01-24 – 2024-01-26 (×5): 200 mg via ORAL
  Filled 2024-01-24 (×5): qty 2

## 2024-01-24 MED ORDER — LACTULOSE 10 GM/15ML PO SOLN
30.0000 g | Freq: Three times a day (TID) | ORAL | Status: DC
  Administered 2024-01-24 – 2024-01-26 (×5): 30 g via ORAL
  Filled 2024-01-24 (×5): qty 60

## 2024-01-24 NOTE — Assessment & Plan Note (Addendum)
 Increased dose of lactulose .  Patient having bowel movements.

## 2024-01-24 NOTE — Progress Notes (Signed)
 Progress Note   Patient: Rachel Wall [id number]:[id number] DOB: [birth year] DOA: 01/17/2024     6 DOS: the patient was seen and examined on 01/24/2024   Brief hospital course: 43 y.o. 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  Patient reports abdominal pain and abdominal distention for 2 days prior to arrival.  Labs in the ED reveal hemoglobin of 10.8, down from 13.8 in November 2024.  Blood pressure 94/66, vital signs otherwise unremarkable.  Pelvic ultrasound reveals 4.5 cm hemorrhagic cyst in the right ovary.  OB/GYN was consulted and recommended serial CBCs to ensure hemoglobin remained stable.  Hemoglobin remained stable and eventually began uptrending to no acute surgical intervention deemed necessary.  Patient's abdominal swelling continued with subsequent worsening of pain.  She was started on IV Lasix  and diuresed well.  There was concern for ascites and abdominal ultrasound was performed which revealed no true ascites.  CT scan with body wall edema only.   4/23.  Patient states her normal weight is 143.  She is up at 175.93.  Increase diuresis to 40 mg IV twice daily.  Patient states her swelling is mostly in the abdomen.  Restart low-dose spironolactone .  Her blood pressure is normally on the lower side. 4/24.  Weight down to 151 pounds.  Continue diuresis.  Patient still feeling miserable with her abdominal pain. 4/25.  Weight down to 150.6 pounds.  Continue diuresis.  CT renal scan shows that the right renal edema and urothelial thickening of the proximal right ureter is more difficult to appreciate on this unenhanced study, no calculi or obstructive uropathy within either kidney, large amount of retained stool throughout the colon consistent with constipation,  normal appendix, interval resolution of the right adnexal cyst seen previously, left Bosniak benign renal cyst measuring 2.3 cm.  Assessment and Plan: * Acute on chronic diastolic CHF (congestive heart failure) (HCC) Continue diuresis to 40 mg Lasix  IV twice daily.  Continue spironolactone  dose.  Her blood pressure is normally on the lower side.  Patient states her normal weight is around 143.  Weight down to 150 today.  Abdominal pain As needed pain medication.  Increased oxycodone  to 10 mg every 4 as needed.  Previous CT scan reviewed and pelvic ultrasound reviewed.  Today's CT scan reviewed.  Patient has resolution of the previous ovarian cyst so I am wondering if she had an ovarian cyst rupture which is causing her pain.  Trial of gabapentin  see if that helps out her pain.  Duodenitis On Protonix  twice a day  Adnexal cyst 4.5 cm right adnexal cyst with small amount of free fluid in the pelvis.  Repeat CT scan does not seem to be cyst.  So I am wondering if the cyst had ruptured which is causing her abdominal pain.  Message to the gynecologist who saw her during this hospital stay yesterday.  UTI (urinary tract infection) Completed Rocephin   Kidney cysts Radiologist commented that this kidney cyst is benign  Pulmonary arterial hypertension (HCC) Continue Ambrisentan , selexipag  and Adcirca   HTN (hypertension) Continue Cardizem  CD.    Normocytic anemia Last hemoglobin 12.9.  Hypokalemia Replace while on Lasix   Tobacco use Nicotine  patch  Depression with anxiety Celexa   Constipation Increased dose of lactulose         Subjective: Patient still having some abdominal distention and abdominal  pain especially in the right lower quadrant.  Patient vomited today.  Admitted with abdominal pain.  Physical Exam: Vitals:   01/24/24 0448 01/24/24 0500 01/24/24 0934 01/24/24 1508  BP: 91/65  101/66   Pulse: 61  72   Resp: 16  16   Temp: 98.1 F (36.7 C)  98.3 F (36.8 C)    TempSrc: Oral  Oral   SpO2: 95%  100%   Weight:  75.7 kg  68.3 kg  Height:       Physical Exam HENT:     Head: Normocephalic.  Eyes:     General: Lids are normal.     Conjunctiva/sclera: Conjunctivae normal.  Cardiovascular:     Rate and Rhythm: Normal rate and regular rhythm.     Heart sounds: S1 normal and S2 normal. Murmur heard.     Systolic murmur is present with a grade of 2/6.  Pulmonary:     Breath sounds: No decreased breath sounds, wheezing, rhonchi or rales.  Abdominal:     General: There is distension.     Palpations: Abdomen is soft.     Tenderness: There is abdominal tenderness in the right lower quadrant.  Musculoskeletal:     Right lower leg: No swelling.     Left lower leg: No swelling.  Skin:    General: Skin is warm.     Findings: No rash.  Neurological:     Mental Status: She is alert and oriented to person, place, and time.     Data Reviewed: CT scan today reviewed White blood cell count 6.3, hemoglobin 12.9, platelet count 284 Creatinine 0.75, electrolytes normal.  Family Communication: Patient's daughter was at the bedside  Disposition: Status is: Inpatient Remains inpatient appropriate because: Continue diuresis.  Planned Discharge Destination: Home    Time spent: 28 minutes  Author: Verla Glaze, MD 01/24/2024 4:22 PM  For on call review [email or web address].

## 2024-01-25 DIAGNOSIS — N949 Unspecified condition associated with female genital organs and menstrual cycle: Secondary | ICD-10-CM | POA: Diagnosis not present

## 2024-01-25 DIAGNOSIS — N3000 Acute cystitis without hematuria: Secondary | ICD-10-CM | POA: Diagnosis not present

## 2024-01-25 DIAGNOSIS — M461 Sacroiliitis, not elsewhere classified: Secondary | ICD-10-CM

## 2024-01-25 DIAGNOSIS — K5909 Other constipation: Secondary | ICD-10-CM

## 2024-01-25 DIAGNOSIS — R1031 Right lower quadrant pain: Secondary | ICD-10-CM | POA: Diagnosis not present

## 2024-01-25 DIAGNOSIS — I5033 Acute on chronic diastolic (congestive) heart failure: Secondary | ICD-10-CM | POA: Diagnosis not present

## 2024-01-25 LAB — BASIC METABOLIC PANEL WITH GFR
Anion gap: 8 (ref 5–15)
BUN: 18 mg/dL (ref 6–20)
CO2: 29 mmol/L (ref 22–32)
Calcium: 9.5 mg/dL (ref 8.9–10.3)
Chloride: 99 mmol/L (ref 98–111)
Creatinine, Ser: 0.86 mg/dL (ref 0.44–1.00)
GFR, Estimated: >60 mL/min (ref >60)
Glucose, Bld: 90 mg/dL (ref 70–99)
Potassium: 4.1 mmol/L (ref 3.5–5.1)
Sodium: 136 mmol/L (ref 135–145)

## 2024-01-25 MED ORDER — PANTOPRAZOLE SODIUM 40 MG PO TBEC
40.0000 mg | DELAYED_RELEASE_TABLET | Freq: Two times a day (BID) | ORAL | Status: DC
  Administered 2024-01-25 – 2024-01-26 (×3): 40 mg via ORAL
  Filled 2024-01-25 (×3): qty 1

## 2024-01-25 MED ORDER — SIMETHICONE 80 MG PO CHEW
80.0000 mg | CHEWABLE_TABLET | Freq: Four times a day (QID) | ORAL | Status: DC | PRN
  Administered 2024-01-25: 80 mg via ORAL
  Filled 2024-01-25: qty 1

## 2024-01-25 MED ORDER — FUROSEMIDE 10 MG/ML IJ SOLN
40.0000 mg | Freq: Once | INTRAMUSCULAR | Status: AC
  Administered 2024-01-25: 40 mg via INTRAVENOUS
  Filled 2024-01-25: qty 4

## 2024-01-25 MED ORDER — METHYLPREDNISOLONE SODIUM SUCC 40 MG IJ SOLR
40.0000 mg | Freq: Once | INTRAMUSCULAR | Status: AC
Start: 2024-01-25 — End: 2024-01-25
  Administered 2024-01-25: 40 mg via INTRAVENOUS
  Filled 2024-01-25: qty 1

## 2024-01-25 NOTE — Assessment & Plan Note (Signed)
 Given a dose of Solu-Medrol yesterday will switch over to prednisone  taper.

## 2024-01-25 NOTE — Progress Notes (Signed)
 Progress Note   Patient: Rachel Wall ZOX:[id number] DOB: Jul 21, 1981 DOA: 01/17/2024     7 DOS: the patient was seen and examined on 01/25/2024   Brief hospital course: 43 y.o. 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  Patient reports abdominal pain and abdominal distention for 2 days prior to arrival.  Labs in the ED reveal hemoglobin of 10.8, down from 13.8 in November 2024.  Blood pressure 94/66, vital signs otherwise unremarkable.  Pelvic ultrasound reveals 4.5 cm hemorrhagic cyst in the right ovary.  OB/GYN was consulted and recommended serial CBCs to ensure hemoglobin remained stable.  Hemoglobin remained stable and eventually began uptrending to no acute surgical intervention deemed necessary.  Patient's abdominal swelling continued with subsequent worsening of pain.  She was started on IV Lasix  and diuresed well.  There was concern for ascites and abdominal ultrasound was performed which revealed no true ascites.  CT scan with body wall edema only.   4/23.  Patient states her normal weight is 143.  She is up at 175.93.  Increase diuresis to 40 mg IV twice daily.  Patient states her swelling is mostly in the abdomen.  Restart low-dose spironolactone .  Her blood pressure is normally on the lower side. 4/24.  Weight down to 151 pounds.  Continue diuresis.  Patient still feeling miserable with her abdominal pain. 4/25.  Weight down to 150.6 pounds.  Continue diuresis.  CT renal scan shows that the right renal edema and urothelial thickening of the proximal right ureter is more difficult to appreciate on this unenhanced study, no calculi or obstructive uropathy within either kidney, large amount of retained stool throughout the colon consistent with constipation,  normal appendix, interval resolution of the right adnexal cyst seen previously, left Bosniak benign renal cyst measuring 2.3 cm. 4/26.  Patient's weight down to 150.1 pounds.  Normally weighs 143.  Patient having some pain at her right sacroiliac joint and dose of Solu-Medrol ordered.  Assessment and Plan: * Acute on chronic diastolic CHF (congestive heart failure) (HCC) Continue diuresis to 40 mg Lasix  IV twice daily.  Given extra dose of Lasix  40 mg IV this morning continue spironolactone  dose.  Her blood pressure is normally on the lower side.  Patient states her normal weight is around 143.  Weight down to 150.1 today.  Abdominal pain As needed pain medication.  Continue oxycodone  to 10 mg every 4 as needed.  Previous CT scan reviewed and pelvic ultrasound reviewed.  Yesterday's CT scan reviewed.  Patient has resolution of the previous ovarian cyst so I am wondering if she had an ovarian cyst rupture which is causing her pain.  Trial of gabapentin  see if that helps out her pain.  This patient also having pain in the right sacroiliac joint will give a dose of Solu-Medrol today to see if that helps her pain.  Sacroiliitis (HCC) Give a dose of Solu-Medrol and see if this helps her pains.  Adnexal cyst 4.5 cm right adnexal cyst with small amount of free fluid in the pelvis.  Repeat CT scan does not seem to be cyst.  So I am wondering if the cyst had ruptured which is causing her abdominal pain.  Message to the gynecologist who saw her during this hospital stay yesterday.  UTI (urinary tract infection) Completed Rocephin   Duodenitis On  Protonix  twice a day  Kidney cysts Radiologist commented that this kidney cyst is benign  Pulmonary arterial hypertension (HCC) Continue Ambrisentan , selexipag  and Adcirca   HTN (hypertension) Continue Cardizem  CD.    Normocytic anemia Last hemoglobin 12.9.  Hypokalemia Replace while on Lasix   Tobacco use Nicotine  patch  Depression with  anxiety Celexa   Constipation Increased dose of lactulose .  Patient having bowel movements.        Subjective: Patient having some pain in her right sacroiliac joint.  Dose of Solu-Medrol ordered.  Still having right lower quadrant abdominal pain.  Patient slept last night but was in a lot of pain this morning because she went a long time without pain medication.  Physical Exam: Vitals:   01/25/24 0427 01/25/24 0746 01/25/24 0910 01/25/24 1024  BP: 100/65 (!) 95/58  105/73  Pulse: 73 66    Resp: 18 16    Temp: 98 F (36.7 C) 98.4 F (36.9 C)    TempSrc:      SpO2: 92% 96%    Weight: 70.6 kg  68.1 kg   Height:       Physical Exam HENT:     Head: Normocephalic.  Eyes:     General: Lids are normal.     Conjunctiva/sclera: Conjunctivae normal.  Cardiovascular:     Rate and Rhythm: Normal rate and regular rhythm.     Heart sounds: S1 normal and S2 normal. Murmur heard.     Systolic murmur is present with a grade of 2/6.  Pulmonary:     Breath sounds: No decreased breath sounds, wheezing, rhonchi or rales.  Abdominal:     General: There is distension.     Palpations: Abdomen is soft.     Tenderness: There is abdominal tenderness in the right lower quadrant.  Musculoskeletal:     Right lower leg: No swelling.     Left lower leg: No swelling.     Comments: Pain right sacroiliac joint.  Skin:    General: Skin is warm.     Findings: No rash.  Neurological:     Mental Status: She is alert and oriented to person, place, and time.     Data Reviewed: Creatinine 0.86  Disposition: Status is: Inpatient Remains inpatient appropriate because: Given extra dose of IV Lasix  this morning.  Continue diuresis.  Give a dose of Solu-Medrol just in case pain inflammatory in nature.  Planned Discharge Destination: Home    Time spent: 28 minutes  Author: Verla Glaze, MD 01/25/2024 1:48 PM  For on call review [email or web address].

## 2024-01-25 NOTE — Plan of Care (Signed)

## 2024-01-26 DIAGNOSIS — R1031 Right lower quadrant pain: Secondary | ICD-10-CM | POA: Diagnosis not present

## 2024-01-26 DIAGNOSIS — M461 Sacroiliitis, not elsewhere classified: Secondary | ICD-10-CM | POA: Diagnosis not present

## 2024-01-26 DIAGNOSIS — N949 Unspecified condition associated with female genital organs and menstrual cycle: Secondary | ICD-10-CM | POA: Diagnosis not present

## 2024-01-26 DIAGNOSIS — I5033 Acute on chronic diastolic (congestive) heart failure: Secondary | ICD-10-CM | POA: Diagnosis not present

## 2024-01-26 DIAGNOSIS — E871 Hypo-osmolality and hyponatremia: Secondary | ICD-10-CM | POA: Insufficient documentation

## 2024-01-26 LAB — BASIC METABOLIC PANEL WITH GFR
Anion gap: 7 (ref 5–15)
BUN: 20 mg/dL (ref 6–20)
CO2: 27 mmol/L (ref 22–32)
Calcium: 9.7 mg/dL (ref 8.9–10.3)
Chloride: 97 mmol/L — ABNORMAL LOW (ref 98–111)
Creatinine, Ser: 0.83 mg/dL (ref 0.44–1.00)
GFR, Estimated: >60 mL/min (ref >60)
Glucose, Bld: 84 mg/dL (ref 70–99)
Potassium: 3.8 mmol/L (ref 3.5–5.1)
Sodium: 131 mmol/L — ABNORMAL LOW (ref 135–145)

## 2024-01-26 MED ORDER — PANTOPRAZOLE SODIUM 40 MG PO TBEC: 40.0000 mg | DELAYED_RELEASE_TABLET | Freq: Every day | ORAL | 0 refills | Status: AC

## 2024-01-26 MED ORDER — FUROSEMIDE 40 MG PO TABS
40.0000 mg | ORAL_TABLET | Freq: Two times a day (BID) | ORAL | Status: DC
  Filled 2024-01-26: qty 1

## 2024-01-26 MED ORDER — PREDNISONE 10 MG PO TABS: ORAL_TABLET | ORAL | 0 refills | Status: DC

## 2024-01-26 MED ORDER — LACTULOSE 10 GM/15ML PO SOLN: 30.0000 g | Freq: Three times a day (TID) | ORAL | 0 refills | Status: AC

## 2024-01-26 MED ORDER — ALPRAZOLAM 1 MG PO TABS: 1.0000 mg | ORAL_TABLET | Freq: Two times a day (BID) | ORAL | 0 refills | Status: AC | PRN

## 2024-01-26 MED ORDER — OXYCODONE HCL 10 MG PO TABS: ORAL_TABLET | ORAL | 0 refills | Status: AC

## 2024-01-26 MED ORDER — ACETAMINOPHEN 325 MG PO TABS: 650.0000 mg | ORAL_TABLET | Freq: Four times a day (QID) | ORAL | Status: AC | PRN

## 2024-01-26 MED ORDER — FUROSEMIDE 40 MG PO TABS
40.0000 mg | ORAL_TABLET | Freq: Two times a day (BID) | ORAL | 0 refills | Status: AC
Start: 2024-01-26 — End: ?

## 2024-01-26 MED ORDER — GABAPENTIN 100 MG PO CAPS: 200.0000 mg | ORAL_CAPSULE | Freq: Two times a day (BID) | ORAL | 0 refills | Status: DC

## 2024-01-26 MED ORDER — NICOTINE 21 MG/24HR TD PT24: MEDICATED_PATCH | TRANSDERMAL | 0 refills | Status: AC

## 2024-01-26 MED ORDER — PREDNISONE 20 MG PO TABS
40.0000 mg | ORAL_TABLET | Freq: Every day | ORAL | Status: DC
  Administered 2024-01-26: 40 mg via ORAL
  Filled 2024-01-26: qty 2

## 2024-01-26 NOTE — TOC Transition Note (Signed)
 Transition of Care [facility]) - Discharge Note   Patient Details  Name: Rachel Wall MRN: [id number] Date of Birth: 05-16-1981  Transition of Care [facility]) CM/SW Contact:  Areta Beer, RN Phone Number: 01/26/2024, 12:27 PM   Clinical Narrative:  4/27: Discharge orders in for home/self care. Admitted 4/18 from home with 2 day history of abdominal pain. OB/GYN consult completed.  Patient has a pacemaker (2023).  No discharge TOC needs or SDOH needs identified.    Katheryn Pandy MSN RN CM  RN Case Manager [place]  Transitions of Care Direct Dial: [phone] (Weekends Only) [facility] Main Office Phone: [phone] [facility] Fax: [phone] [email or web address]   Transition of Care (TOC) - Inpatient Brief Assessment (from prior CM note)  Transition of Care Asessment: Insurance and Status: Insurance coverage has been reviewed Patient has primary care physician: Yes Home environment has been reviewed: Single family home Prior level of function:: Not documented Prior/Current Home Services: No current home services Social Drivers of Health Review: SDOH reviewed no interventions necessary Readmission risk has been reviewed: Yes Transition of care needs: no transition of care needs at this time       Final next level of care: Home/Self Care     Patient Goals and CMS Choice            Discharge Placement                       Discharge Plan and Services Additional resources added to the After Visit Summary for                                       Social Drivers of Health (SDOH) Interventions SDOH Screenings   Food Insecurity: No Food Insecurity (01/17/2024)  Housing: Low Risk  (01/17/2024)  Transportation Needs: No Transportation Needs (01/17/2024)  Utilities: Not At Risk (01/17/2024)  Tobacco Use: Medium Risk (01/17/2024)     Readmission Risk Interventions     No data to display

## 2024-01-26 NOTE — Assessment & Plan Note (Signed)
 Sodium dipped down to 131 likely with diuresis.

## 2024-01-26 NOTE — Discharge Summary (Signed)
 Physician Discharge Summary   Patient: Rachel Wall MRN: [id number] DOB: 24-Jan-1981  Admit date:     01/17/2024  Discharge date: 01/26/24  Discharge Physician: Verla Glaze   PCP: System, Provider Not In   Recommendations at discharge:   Follow-up with your medical doctor Patient wants to follow-up with Dr. Cleora Daft gynecology Follow-up with your cardiologist  Discharge Diagnoses: Principal Problem:   Acute on chronic diastolic CHF (congestive heart failure) (HCC) Active Problems:   Abdominal pain   Sacroiliitis (HCC)   Adnexal cyst   Duodenitis   UTI (urinary tract infection)   Kidney cysts   Pulmonary arterial hypertension (HCC)   HTN (hypertension)   Normocytic anemia   Hypokalemia   Tobacco use   Depression with anxiety   Hemorrhagic cyst of right ovary   Constipation   Hyponatremia    Hospital Course: 43 y.o. 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  Patient reports abdominal pain and abdominal distention for 2 days prior to arrival.  Labs in the ED reveal hemoglobin of 10.8, down from 13.8 in November 2024.  Blood pressure 94/66, vital signs otherwise unremarkable.  Pelvic ultrasound reveals 4.5 cm hemorrhagic cyst in the right ovary.  OB/GYN was consulted and recommended serial CBCs to ensure hemoglobin remained stable.  Hemoglobin remained stable and eventually began uptrending to no acute surgical intervention deemed necessary.  Patient's abdominal swelling continued with subsequent worsening of pain.  She was started on IV Lasix  and diuresed well.  There was concern for ascites and abdominal ultrasound was performed which revealed no true ascites.  CT scan with body wall edema only.   4/23.  Patient states her normal weight is  143.  She is up at 175.93.  Increase diuresis to 40 mg IV twice daily.  Patient states her swelling is mostly in the abdomen.  Restart low-dose spironolactone .  Her blood pressure is normally on the lower side. 4/24.  Weight down to 151 pounds.  Continue diuresis.  Patient still feeling miserable with her abdominal pain. 4/25.  Weight down to 150.6 pounds.  Continue diuresis.  CT renal scan shows that the right renal edema and urothelial thickening of the proximal right ureter is more difficult to appreciate on this unenhanced study, no calculi or obstructive uropathy within either kidney, large amount of retained stool throughout the colon consistent with constipation, normal appendix, interval resolution of the right adnexal cyst seen previously, left Bosniak benign renal cyst measuring 2.3 cm. 4/26.  Patient's weight down to 150.1 pounds.  Normally weighs 143.  Patient having some pain at her right sacroiliac joint and dose of Solu-Medrol ordered. 4/27.  Patient wants to go home today.  Patient still has some abdominal pain.  Tapering dose of oxycodone  prescribed.  Patient asked for a few pills of Xanax  also.  Prescribed a prednisone  taper.  Assessment and Plan: * Acute on chronic diastolic CHF (congestive heart failure) (HCC) Patient was diuresed with 40 mg IV twice daily of Lasix  and switched over to oral 40 mg twice daily upon going home.  Continue spironolactone  dose.  Her blood pressure is normally on the lower side.  Patient states her normal weight is around 143.  Weight 153 today.  Continue Lasix  40 mg twice daily at home and then hopefully can cut back to once a day  once weight is down.  Abdominal pain Previous CT scans reviewed.  Patient has resolution of the previous ovarian cyst so I am wondering if she had an ovarian cyst rupture which is causing her pain.  Trial of gabapentin  see if that helps out her pain.  This patient also having pain in the right sacroiliac joint will give a a  prednisone  taper also.  Since the patient was requiring a lot of pain medications during the hospital course I will have to taper down oxycodone  from 10 mg down to 5 mg and taper the frequency.  Then can convert to Tylenol  after that  Sacroiliitis [facility]) Given a dose of Solu-Medrol yesterday will switch over to prednisone  taper.  Adnexal cyst 4.5 cm right adnexal cyst with small amount of free fluid in the pelvis.  Repeat CT scan does not see the cyst.  So I am wondering if the cyst had ruptured which is causing her abdominal pain.  Follow-up with gynecology as outpatient.  UTI (urinary tract infection) Completed Rocephin   Duodenitis On Protonix  daily  Kidney cysts Radiologist commented that this kidney cyst is benign  Pulmonary arterial hypertension (HCC) Continue Ambrisentan , selexipag  and Adcirca   HTN (hypertension) Continue Cardizem  CD.    Normocytic anemia Last hemoglobin 12.9.  Hypokalemia Replaced  Tobacco use Nicotine  patch  Depression with anxiety Celexa   Hyponatremia Sodium dipped down to 131 likely with diuresis.  Constipation Continue lactulose  while on pain medications         Consultants: Gynecology Procedures performed: None Disposition: Home Diet recommendation:  Cardiac diet DISCHARGE MEDICATION: Allergies as of 01/26/2024       Reactions   Cyclosporine Other (See Comments)   Medication interactions   Nitrates, Organic    Can't take w/ other meds   Nitroglycerin Other (See Comments)   Medication interactions Medication interactions Medication interactions-Lung meds taken by pt. No Nitrates please   Tizanidine    Interaction with heart and lung medications Interaction with heart and lung medications   Nsaids    Reason: Gastric bypass   Other Other (See Comments)   Muscle relaxers - per patient interacts with other medications she is on. Gastric bypass   Adhesive [tape] Rash        Medication List     STOP taking these  medications    potassium chloride  SA 20 MEQ tablet Commonly known as: KLOR-CON  M       TAKE these medications    acetaminophen  325 MG tablet Commonly known as: TYLENOL  Take 2 tablets (650 mg total) by mouth every 6 (six) hours as needed for mild pain (pain score 1-3) or fever.   ALPRAZolam  1 MG tablet Commonly known as: XANAX  Take 1 tablet (1 mg total) by mouth 2 (two) times daily as needed for anxiety.   ambrisentan  10 MG tablet Commonly known as: LETAIRIS  Take 10 mg by mouth daily.   citalopram  40 MG tablet Commonly known as: CELEXA  Take 40 mg by mouth daily.   diltiazem  120 MG tablet Commonly known as: CARDIZEM  Take 120-240 mg by mouth See admin instructions. Take 120 mg by mouth in the morning and take 240 mg by mouth at bedtime   estradiol  2 MG tablet Commonly known as: ESTRACE  Take 1 tablet (2 mg total) by mouth daily.   furosemide  40 MG tablet Commonly known as: LASIX  Take 1 tablet (40 mg total) by mouth 2 (two) times daily.   gabapentin  100 MG capsule Commonly known as: NEURONTIN  Take 2 capsules (200 mg  total) by mouth 2 (two) times daily.   lactulose  10 GM/15ML solution Commonly known as: CHRONULAC  Take 45 mLs (30 g total) by mouth 3 (three) times daily.   nicotine  21 mg/24hr patch Commonly known as: NICODERM CQ  - dosed in mg/24 hours One patch chest wall daily (okay to substitute generic) Start taking on: January 27, 2024   Oxycodone  HCl 10 MG Tabs 1 tab po every four hours for one day; one tab po every six hours for 2 days; one tab po every eight hours for 2 days; 1/2 tab every eight hours for 2 days; 1/2 tab every twelve hours for two days; 1/2 tab daily for 2 days   pantoprazole  40 MG tablet Commonly known as: PROTONIX  Take 1 tablet (40 mg total) by mouth daily.   predniSONE  10 MG tablet Commonly known as: DELTASONE  3 tabs po day 1; 2 tabs po day 2,3; 1 tab po day 4,5; 1/2 tab po day 6,7   Selexipag  1200 MCG Tabs Take 1,200 mcg by mouth 2  (two) times daily.   spironolactone  50 MG tablet Commonly known as: ALDACTONE  Take 50 mg by mouth daily.   tadalafil  (PAH) 20 MG tablet Commonly known as: ADCIRCA  Take 40 mg by mouth daily.   traZODone  50 MG tablet Commonly known as: DESYREL  TAKE 3 TABLETS(150 MG) BY MOUTH AT BEDTIME AS NEEDED FOR SLEEP        Discharge Exam: Filed Weights   01/25/24 0910 01/26/24 0441 01/26/24 0745  Weight: 68.1 kg 68.8 kg 69.4 kg   Physical Exam HENT:     Head: Normocephalic.  Eyes:     General: Lids are normal.     Conjunctiva/sclera: Conjunctivae normal.  Cardiovascular:     Rate and Rhythm: Normal rate and regular rhythm.     Heart sounds: S1 normal and S2 normal. Murmur heard.     Systolic murmur is present with a grade of 2/6.  Pulmonary:     Breath sounds: No decreased breath sounds, wheezing, rhonchi or rales.  Abdominal:     General: There is distension.     Palpations: Abdomen is soft.     Tenderness: There is abdominal tenderness in the right lower quadrant.  Musculoskeletal:     Right lower leg: No swelling.     Left lower leg: No swelling.     Comments: Pain right sacroiliac joint.  Skin:    General: Skin is warm.     Findings: No rash.  Neurological:     Mental Status: She is alert and oriented to person, place, and time.      Condition at discharge: stable  The results of significant diagnostics from this hospitalization (including imaging, microbiology, ancillary and laboratory) are listed below for reference.   Imaging Studies: CT RENAL STONE STUDY Result Date: 01/24/2024 CLINICAL DATA:  Right lower quadrant abdominal pain EXAM: CT ABDOMEN AND PELVIS WITHOUT CONTRAST TECHNIQUE: Multidetector CT imaging of the abdomen and pelvis was performed following the standard protocol without IV contrast. Unenhanced CT was performed per clinician order. Lack of IV contrast limits sensitivity and specificity, especially for evaluation of abdominal/pelvic solid viscera.  RADIATION DOSE REDUCTION: This exam was performed according to the departmental dose-optimization program which includes automated exposure control, adjustment of the mA and/or kV according to patient size and/or use of iterative reconstruction technique. COMPARISON:  01/17/2024, 01/21/2024 FINDINGS: Lower chest: Subtle ground-glass opacities are seen within the dependent lower lobes, which may be hypoventilatory. Hepatobiliary: Cholecystectomy. Unremarkable unenhanced appearance of the  liver. No biliary duct dilation. Pancreas: Unremarkable unenhanced appearance. Spleen: Stable splenomegaly. Unremarkable unenhanced appearance of the splenic parenchyma. Adrenals/Urinary Tract: No urinary tract calculi or obstructive uropathy within either kidney. The mild right renal edema and uroepithelial thickening of the proximal right ureter seen on prior contrasted CT is more difficult to appreciate on this exam. 2.3 cm cyst upper pole left kidney measures 20 HU. The adrenals and bladder are unremarkable. Stomach/Bowel: No bowel obstruction or ileus. There is a large amount of stool throughout the colon which may reflect constipation. Normal gas-filled appendix right lower quadrant. Stable postsurgical changes from prior bariatric surgery. No bowel wall thickening or inflammatory change. Vascular/Lymphatic: No significant vascular findings are present. No enlarged abdominal or pelvic lymph nodes. Reproductive: Prior hysterectomy. No adnexal masses. The right adnexal cyst seen on prior exam has resolved in the interim. Other: No free fluid or free intraperitoneal gas. No abdominal wall hernia. Musculoskeletal: No acute or destructive bony abnormalities. Stable spondylosis at the L5-S1 level. Reconstructed images demonstrate no additional findings. IMPRESSION: 1. The right renal edema and urothelial thickening of the proximal right ureter seen on prior contrasted exam are more difficult to appreciate on this unenhanced study. No  radiopaque calculi or obstructive uropathy within either kidney. 2. Large amount of retained stool throughout the colon consistent with constipation. No bowel obstruction or ileus. 3. Normal appendix. 4. Interval resolution of the right adnexal cyst seen previously. 5. Left Bosniak I benign renal cyst measuring 2.3 cm. No follow-up imaging is recommended. JACR 2018 Feb; 264-273, Management of the Incidental Renal Mass on CT, [employer] 2021; 814-848, Bosniak Classification of Cystic Renal Masses, Version 2019. Electronically Signed   By: Bobbye Burrow M.D.   On: 01/24/2024 15:42   US  ASCITES (ABDOMEN LIMITED) Result Date: 01/21/2024 CLINICAL DATA:  Ascites EXAM: LIMITED ABDOMEN ULTRASOUND FOR ASCITES TECHNIQUE: Limited ultrasound survey for ascites was performed in all four abdominal quadrants. COMPARISON:  CT 01/17/2024 FINDINGS: No ascites seen in the 4 quadrants of the abdomen at this time by ultrasound. Question tiny left pleural effusion. IMPRESSION: No significant ascites seen in the abdomen by ultrasound. Overlapping bowel gas identified. Electronically Signed   By: Adrianna Horde M.D.   On: 01/21/2024 15:05   ECHOCARDIOGRAM COMPLETE Result Date: 01/19/2024    ECHOCARDIOGRAM REPORT   Patient Name:   Rachel Wall Date of Exam: 01/19/2024 Medical Rec #:  [id number]     Height:       68.0 in Accession #:    [id number]    Weight:       171.6 lb Date of Birth:  Feb 04, 1981    BSA:          1.915 m Patient Age:    42 years      BP:           100/55 mmHg Patient Gender: F             HR:           71 bpm. Exam Location:  [facility] Procedure: 2D Echo, Color Doppler and Cardiac Doppler (Both Spectral and Color            Flow Doppler were utilized during procedure). Indications:     CHF-Acute Diastolic I50.31  History:         Patient has no prior history of Echocardiogram examinations.                  CHF. PHTN.  Sonographer:  Kathaleen Pale Roar Referring Phys:  [id number] ALEXANDRA DEZII Diagnosing Phys: Timothy Gollan  MD IMPRESSIONS  1. Left ventricular ejection fraction, by estimation, is 60 to 65%. Left ventricular ejection fraction by 2D MOD biplane is 54.5 %. The left ventricle has normal function. The left ventricle has no regional wall motion abnormalities. Left ventricular diastolic parameters were normal.  2. Right ventricular systolic function is normal. The right ventricular size is normal. There is moderately elevated pulmonary artery systolic pressure.  3. The mitral valve is normal in structure. Mild mitral valve regurgitation. No evidence of mitral stenosis.  4. The aortic valve is normal in structure. Aortic valve regurgitation is not visualized. No aortic stenosis is present.  5. The inferior vena cava is normal in size with greater than 50% respiratory variability, suggesting right atrial pressure of 3 mmHg. FINDINGS  Left Ventricle: Left ventricular ejection fraction, by estimation, is 60 to 65%. Left ventricular ejection fraction by 2D MOD biplane is 54.5 %. The left ventricle has normal function. The left ventricle has no regional wall motion abnormalities. Strain  was performed and the global longitudinal strain is indeterminate. The left ventricular internal cavity size was normal in size. There is no left ventricular hypertrophy. Left ventricular diastolic parameters were normal. Right Ventricle: The right ventricular size is normal. No increase in right ventricular wall thickness. Right ventricular systolic function is normal. There is moderately elevated pulmonary artery systolic pressure. The tricuspid regurgitant velocity is 3.46 m/s, and with an assumed right atrial pressure of 10 mmHg, the estimated right ventricular systolic pressure is 57.9 mmHg. Left Atrium: Left atrial size was normal in size. Right Atrium: Right atrial size was normal in size. Pericardium: Trivial pericardial effusion is present. Mitral Valve: The mitral valve is normal in structure. Mild mitral valve regurgitation. No evidence  of mitral valve stenosis. MV peak gradient, 7.7 mmHg. The mean mitral valve gradient is 4.0 mmHg. Tricuspid Valve: The tricuspid valve is normal in structure. Tricuspid valve regurgitation is not demonstrated. No evidence of tricuspid stenosis. Aortic Valve: The aortic valve is normal in structure. Aortic valve regurgitation is not visualized. No aortic stenosis is present. Aortic valve mean gradient measures 8.0 mmHg. Aortic valve peak gradient measures 14.0 mmHg. Aortic valve area, by VTI measures 2.70 cm. Pulmonic Valve: The pulmonic valve was normal in structure. Pulmonic valve regurgitation is mild. No evidence of pulmonic stenosis. Aorta: The aortic root is normal in size and structure. Venous: The inferior vena cava is normal in size with greater than 50% respiratory variability, suggesting right atrial pressure of 3 mmHg. IAS/Shunts: No atrial level shunt detected by color flow Doppler. Additional Comments: 3D was performed not requiring image post processing on an independent workstation and was indeterminate. A device lead is visualized.  LEFT VENTRICLE PLAX 2D                        Biplane EF (MOD) LVIDd:         4.70 cm         LV Biplane EF:   Left LVIDs:         3.50 cm                          ventricular LV PW:         1.10 cm  ejection LV IVS:        1.20 cm                          fraction by LVOT diam:     2.10 cm                          2D MOD LV SV:         110                              biplane is LV SV Index:   57                               54.5 %. LVOT Area:     3.46 cm                                Diastology                                LV e' medial:    12.60 cm/s LV Volumes (MOD)               LV E/e' medial:  9.9 LV vol d, MOD    106.0 ml      LV e' lateral:   12.80 cm/s A2C:                           LV E/e' lateral: 9.8 LV vol d, MOD    105.0 ml A4C: LV vol s, MOD    43.8 ml A2C: LV vol s, MOD    50.7 ml A4C: LV SV MOD A2C:   62.2 ml LV SV MOD A4C:    105.0 ml LV SV MOD BP:    58.3 ml RIGHT VENTRICLE RV Basal diam:  4.10 cm RV Mid diam:    4.10 cm RV S prime:     19.00 cm/s TAPSE (M-mode): 3.1 cm LEFT ATRIUM             Index        RIGHT ATRIUM           Index LA diam:        3.90 cm 2.04 cm/m   RA Area:     27.60 cm LA Vol (A2C):   75.4 ml 39.37 ml/m  RA Volume:   95.80 ml  50.03 ml/m LA Vol (A4C):   63.5 ml 33.16 ml/m LA Biplane Vol: 70.4 ml 36.76 ml/m  AORTIC VALVE                     PULMONIC VALVE AV Area (Vmax):    3.19 cm      PV Vmax:          1.51 m/s AV Area (Vmean):   2.66 cm      PV Peak grad:     9.1 mmHg AV Area (VTI):     2.70 cm      PR End Diast Vel: 9.73 msec AV Vmax:           187.00 cm/s   RVOT Peak grad:   3 mmHg  AV Vmean:          138.000 cm/s AV VTI:            0.406 m AV Peak Grad:      14.0 mmHg AV Mean Grad:      8.0 mmHg LVOT Vmax:         172.00 cm/s LVOT Vmean:        106.000 cm/s LVOT VTI:          0.317 m LVOT/AV VTI ratio: 0.78  AORTA Ao Root diam: 2.40 cm Ao Asc diam:  2.60 cm MITRAL VALVE                TRICUSPID VALVE MV Area (PHT): 3.43 cm     TR Peak grad:   47.9 mmHg MV Area VTI:   2.28 cm     TR Vmax:        346.00 cm/s MV Peak grad:  7.7 mmHg MV Mean grad:  4.0 mmHg     SHUNTS MV Vmax:       1.39 m/s     Systemic VTI:  0.32 m MV Vmean:      94.3 cm/s    Systemic Diam: 2.10 cm MV Decel Time: 221 msec MV E velocity: 125.00 cm/s MV A velocity: 97.30 cm/s MV E/A ratio:  1.28 MV A Prime:    16.0 cm/s Belva Boyden MD Electronically signed by Belva Boyden MD Signature Date/Time: 01/19/2024/1:50:38 PM    Final    US  PELVIC TRANSABD W/PELVIC DOPPLER Result Date: 01/18/2024 CLINICAL DATA:  Adnexal cyst seen on CT EXAM: TRANSABDOMINAL ULTRASOUND OF PELVIS DOPPLER ULTRASOUND OF OVARIES TECHNIQUE: Transabdominal ultrasound examination of the pelvis was performed including evaluation of the uterus, ovaries, adnexal regions, and pelvic cul-de-sac. Color and duplex Doppler ultrasound was utilized to evaluate blood flow to  the ovaries. COMPARISON:  CT abdomen pelvis 01/17/2024 FINDINGS: Uterus Hysterectomy. Right ovary Measurements: 5.6 x 4.0 x 5.3 cm. = volume: 62 mL. There is a 4.5 x 3.6 x 3.5 cm heterogenous cystic lesion with internal hyperechogenicity and reticulations suggestive of hemorrhagic cyst. No evidence of internal vascularity. Left ovary Measurements: 3.7 x 2.4 x 3.1 cm = volume: 14.3 mL. Normal appearance/no adnexal mass. Pulsed Doppler evaluation demonstrates normal low-resistance arterial and venous waveforms in both ovaries. Other: Small amount of free fluid in the right adnexa. IMPRESSION: 1. 4.5 cm hemorrhagic cyst in the right ovary. Consider follow-up ultrasound in 6-12 weeks to ensure resolution. 2. Small amount of free fluid in the right adnexa. Electronically Signed   By: Rozell Cornet M.D.   On: 01/18/2024 02:05   CT ABDOMEN PELVIS W CONTRAST Result Date: 01/17/2024 CLINICAL DATA:  Acute abdominal pain, bowel obstruction suspected. Swelling in abdomen. 20 pound weight gain in the past week. Gastric bypass in February of 2023. Lower right quadrant pain. EXAM: CT ABDOMEN AND PELVIS WITH CONTRAST TECHNIQUE: Multidetector CT imaging of the abdomen and pelvis was performed using the standard protocol following bolus administration of intravenous contrast. RADIATION DOSE REDUCTION: This exam was performed according to the departmental dose-optimization program which includes automated exposure control, adjustment of the mA and/or kV according to patient size and/or use of iterative reconstruction technique. CONTRAST:  OMNIPAQUE  IOHEXOL  300 MG/ML  SOLN COMPARISON:  CT 08/14/2023 FINDINGS: Lower chest: No acute abnormality. Hepatobiliary: Periportal edema. Cholecystectomy. No biliary dilation. Pancreas: No pancreatic ductal dilation. There is some fluid and stranding about the pancreatic head. However this appears centered about the descending duodenum.  Spleen: Splenomegaly measuring 15.1 cm in  craniocaudal dimension. Adrenals/Urinary Tract: Normal adrenal glands. 2.3 cm cystic lesion in the posterior left kidney is slightly denser than simple fluid density (Hounsfield units 34). Delayed right nephrogram. Urothelial thickening and hyperenhancement about the proximal right ureter and right renal pelvis. Mild right hydronephrosis. No evidence of obstructing stone. Bladder is unremarkable. Stomach/Bowel: Postoperative change of Roux-en-Y gastric bypass. Mild wall thickening about the descending duodenum with adjacent fluid and stranding. No organized fluid collection. No free intraperitoneal air. No evidence of obstruction. The appendix is normal. Vascular/Lymphatic: No significant vascular findings are present. No enlarged abdominal or pelvic lymph nodes. Reproductive: Hysterectomy. 4.5 cm right adnexal cyst with adjacent small volume free fluid in the pelvis. Other: Free fluid about the descending duodenum/pancreatic head extending inferiorly in the anterior pararenal space. Additional free fluid in the pelvis. No free intraperitoneal air. No drainable fluid collection. Musculoskeletal: No acute fracture.  Body wall edema. IMPRESSION: 1. Free fluid about the descending duodenum and pancreatic head. The fluid appears centered about the duodenum and is favored to represent duodenitis however groove pancreatitis could appear similarly. Correlate with lipase. No organized fluid collection. No free intraperitoneal air. 2. Delayed right nephrogram with urothelial thickening and hyperenhancement about the proximal right ureter and right renal pelvis. Mild right hydronephrosis. No evidence of obstructing stone. Findings are concerning for ascending urinary tract infection. 3. 4.5 cm right adnexal cyst with adjacent small volume free fluid in the pelvis. In the setting of right lower quadrant abdominal pain this could be further evaluated with pelvic ultrasound. 4. 2.3 cm cystic lesion in the posterior left kidney  is slightly denser than simple fluid density. Further evaluation with nonemergent renal protocol MRI with and without IV contrast is recommended. 5. Splenomegaly. 6. Periportal and body wall edema. Electronically Signed   By: Rozell Cornet M.D.   On: 01/17/2024 18:20    Microbiology: Results for orders placed or performed during the hospital encounter of 01/17/24  Urine Culture     Status: Abnormal   Collection Time: 01/17/24 12:39 PM   Specimen: Urine, Clean Catch  Result Value Ref Range Status   Specimen Description   Final    URINE, CLEAN CATCH Performed at [facility], [address] [place], [place] [address]    Special Requests   Final    NONE Performed at [facility], [address] [place], [place] [address]    Culture >=100,000 COLONIES/mL ESCHERICHIA COLI (A)  Final   Report Status 01/19/2024 FINAL  Final   Organism ID, Bacteria ESCHERICHIA COLI (A)  Final      Susceptibility   Escherichia coli - MIC*    AMPICILLIN >=32 RESISTANT Resistant     CEFAZOLIN  <=4 SENSITIVE Sensitive     CEFTRIAXONE  <=0.25 SENSITIVE Sensitive     CIPROFLOXACIN <=0.25 SENSITIVE Sensitive     GENTAMICIN >=16 RESISTANT Resistant     IMIPENEM <=0.25 SENSITIVE Sensitive     NITROFURANTOIN <=16 SENSITIVE Sensitive     TRIMETH/SULFA <=20 SENSITIVE Sensitive     AMPICILLIN/SULBACTAM >=32 RESISTANT Resistant     * >=100,000 COLONIES/mL ESCHERICHIA COLI  Culture, blood (Routine X 2) w Reflex to ID Panel     Status: None   Collection Time: 01/17/24 10:29 PM   Specimen: BLOOD  Result Value Ref Range Status   Specimen Description BLOOD BLOOD LEFT HAND  Final   Special Requests   Final    BOTTLES DRAWN AEROBIC AND ANAEROBIC Blood Culture adequate volume  Culture   Final    NO GROWTH 5 DAYS Performed at [facility], [address] [place], [place] [address]    Report Status 01/22/2024 FINAL  Final  Culture, blood (Routine X 2) w Reflex to ID Panel     Status:  None   Collection Time: 01/17/24 10:33 PM   Specimen: BLOOD  Result Value Ref Range Status   Specimen Description BLOOD BLOOD RIGHT ARM  Final   Special Requests   Final    BOTTLES DRAWN AEROBIC AND ANAEROBIC Blood Culture adequate volume   Culture   Final    NO GROWTH 5 DAYS Performed at [facility], [address], [place], [place] [address]    Report Status 01/22/2024 FINAL  Final  Resp panel by RT-PCR (RSV, Flu A&B, Covid) Anterior Nasal Swab     Status: None   Collection Time: 01/18/24 12:29 PM   Specimen: Anterior Nasal Swab  Result Value Ref Range Status   SARS Coronavirus 2 by RT PCR NEGATIVE NEGATIVE Final    Comment: (NOTE) SARS-CoV-2 target nucleic acids are NOT DETECTED.  The SARS-CoV-2 RNA is generally detectable in upper respiratory specimens during the acute phase of infection. The lowest concentration of SARS-CoV-2 viral copies this assay can detect is 138 copies/mL. A negative result does not preclude SARS-Cov-2 infection and should not be used as the sole basis for treatment or other patient management decisions. A negative result may occur with  improper specimen collection/handling, submission of specimen other than nasopharyngeal swab, presence of viral mutation(s) within the areas targeted by this assay, and inadequate number of viral copies(<138 copies/mL). A negative result must be combined with clinical observations, patient history, and epidemiological information. The expected result is Negative.  Fact Sheet for Patients:  BloggerCourse.com  Fact Sheet for Healthcare Providers:  SeriousBroker.it  This test is no t yet approved or cleared by the [place]  FDA and  has been authorized for detection and/or diagnosis of SARS-CoV-2 by FDA under an Emergency Use Authorization (EUA). This EUA will remain  in effect (meaning this test can be used) for the duration of the COVID-19 declaration  under Section 564(b)(1) of the Act, 21 U.S.C.section 360bbb-3(b)(1), unless the authorization is terminated  or revoked sooner.       Influenza A by PCR NEGATIVE NEGATIVE Final   Influenza B by PCR NEGATIVE NEGATIVE Final    Comment: (NOTE) The Xpert Xpress SARS-CoV-2/FLU/RSV plus assay is intended as an aid in the diagnosis of influenza from Nasopharyngeal swab specimens and should not be used as a sole basis for treatment. Nasal washings and aspirates are unacceptable for Xpert Xpress SARS-CoV-2/FLU/RSV testing.  Fact Sheet for Patients: BloggerCourse.com  Fact Sheet for Healthcare Providers: SeriousBroker.it  This test is not yet approved or cleared by the [place]  FDA and has been authorized for detection and/or diagnosis of SARS-CoV-2 by FDA under an Emergency Use Authorization (EUA). This EUA will remain in effect (meaning this test can be used) for the duration of the COVID-19 declaration under Section 564(b)(1) of the Act, 21 U.S.C. section 360bbb-3(b)(1), unless the authorization is terminated or revoked.     Resp Syncytial Virus by PCR NEGATIVE NEGATIVE Final    Comment: (NOTE) Fact Sheet for Patients: BloggerCourse.com  Fact Sheet for Healthcare Providers: SeriousBroker.it  This test is not yet approved or cleared by the [place]  FDA and has been authorized for detection and/or diagnosis of SARS-CoV-2 by FDA under an Emergency Use Authorization (EUA). This EUA  will remain in effect (meaning this test can be used) for the duration of the COVID-19 declaration under Section 564(b)(1) of the Act, 21 U.S.C. section 360bbb-3(b)(1), unless the authorization is terminated or revoked.  Performed at [facility], [address] [place], [place] [address]     Labs: CBC: Recent Labs  Lab 01/20/24 1002 01/24/24 0829  WBC 3.6* 6.3  NEUTROABS 2.2   --   HGB 11.1* 12.9  HCT 33.0* 37.2  MCV 96.2 94.4  PLT 161 284   Basic Metabolic Panel: Recent Labs  Lab 01/20/24 1002 01/23/24 0500 01/24/24 0829 01/25/24 0740 01/26/24 0812  NA 138 139 136 136 131*  K 3.6 4.4 4.3 4.1 3.8  CL 105 105 103 99 97*  CO2 26 28 29 29 27   GLUCOSE 149* 104* 99 90 84  BUN 6 14 17 18 20   CREATININE 0.69 0.73 0.75 0.86 0.83  CALCIUM 8.8* 9.2 9.6 9.5 9.7   Liver Function Tests: Recent Labs  Lab 01/20/24 1002  AST 15  ALT 22  ALKPHOS 115  BILITOT 0.5  PROT 5.8*  ALBUMIN 2.9*   CBG: No results for input(s): "GLUCAP" in the last 168 hours.  Discharge time spent: greater than 30 minutes.  Signed: Verla Glaze, MD [facility] 01/26/2024

## 2024-01-26 NOTE — Plan of Care (Signed)

## 2024-02-10 ENCOUNTER — Telehealth: Payer: Self-pay

## 2024-02-10 NOTE — Telephone Encounter (Signed)
 The patient called to inquire about the timing of her repeat EGD and colonoscopy. She mentioned that she was recently hospitalized for 10 days due to ongoing stomach issues. I informed her that it appears her last procedure was performed at [facility]. She clarified that the procedure at [facility] was only for a hernia repair and not related to her EGD or colonoscopy.  Please advise whether a new referral is needed to schedule her repeat procedures, or if we should proceed with scheduling based on her prior referral or medical history. Guidance on the next steps for the patient would be appreciated.

## 2024-02-10 NOTE — Telephone Encounter (Signed)
 Dr. Antony Baumgartner, patient had a colonoscopy done on 08/11/2020 with you and since you found a 30 mm polyps, you referred her to Dr. Brice Campi. Then he did the colonoscopy and removed the polyp. He also mentioned that she was to go back in 6-8 months but didn't return. Then on 02/07/2021 you did an EGD for her but no follow up. So, she would need a new referral from her PCP to have an EGD and Colonoscopy? Please advise.

## 2024-02-20 ENCOUNTER — Other Ambulatory Visit: Payer: Self-pay | Admitting: Obstetrics and Gynecology

## 2024-02-20 DIAGNOSIS — Z1231 Encounter for screening mammogram for malignant neoplasm of breast: Secondary | ICD-10-CM

## 2024-04-30 ENCOUNTER — Other Ambulatory Visit: Payer: Self-pay | Admitting: Medical Genetics

## 2024-06-15 ENCOUNTER — Other Ambulatory Visit

## 2024-06-16 ENCOUNTER — Other Ambulatory Visit

## 2024-06-24 ENCOUNTER — Encounter

## 2024-07-07 ENCOUNTER — Ambulatory Visit
Admission: RE | Admit: 2024-07-07 | Discharge: 2024-07-07 | Disposition: A | Discharge status: Home / Self Care | Source: Ambulatory Visit | Attending: Obstetrics and Gynecology | Admitting: Obstetrics and Gynecology

## 2024-07-07 DIAGNOSIS — Z1231 Encounter for screening mammogram for malignant neoplasm of breast: Secondary | ICD-10-CM | POA: Insufficient documentation

## 2024-07-13 ENCOUNTER — Encounter: Payer: Self-pay | Admitting: Urgent Care

## 2024-07-21 ENCOUNTER — Other Ambulatory Visit: Payer: Self-pay | Admitting: Medical Genetics

## 2024-07-21 DIAGNOSIS — Z006 Encounter for examination for normal comparison and control in clinical research program: Secondary | ICD-10-CM

## 2024-07-21 NOTE — H&P (Signed)
 Preoperative History and Physical  Chief Complaint: Rachel Wall is a 43 y.o. [id number] here for surgical management of Pelvic pain and ovarian cysts.   Has history of CHF and pulmonary hypertension. Has sought pre-op clearance from Dr. Heron Hitt, MD, from [facility]  History of Present Illness: She has had ovarian cysts most of her life and then the cysts rupture. She recently was admitted to the hospital for pain. The source of the pain was never adequately identified. She initially had a 4.5 cm ovarian cyst. However, this resolved. She had retained a lot of fluid that was in her mid-section (abdominal wall) and diuresed 20 lbs of fluid. She continues to have similar pain, but states she has always has pain.   Ultrasound on 06/23/2024, demonstrates the following findings Adnexa: Right ovary enlarged with 2 cystic lesions. The first is complex and unilocular measuring 2.2 x 1.7 x 1.8 cm. And the second is complex and unilocular 2.8 x 2.4 x 2.5 cm Uterus: absent Additional: let ovary has a small follicle that measures 1.4 x 1.1 x 1.3 cm.  Mild amount of free fluid in cul-de-sac.    Since her last visit she has had four ovarian cysts rupture on her right side.  She has had a lot of pain.     She is taking trazodone  100 mg for sleep.     She still has low libido and night sweats. She is taking estradiol  2 mg each day.    Proposed surgery: Robot assisted laparoscopic bilateral oophorectomy  Past Medical History:  Diagnosis Date   Anxiety    Arthritis    Bradycardia    has pacemaker   CHF (congestive heart failure) (CMS/HHS-HCC)    Depression    Dyspnea    Dysrhythmia    Family history of breast cancer    neg cancer genetic testing per pt (no results in chart)   Family history of ovarian cancer    GERD (gastroesophageal reflux disease)    Headache    Heart disease    History of kidney stones    Hypertension    Lung disease    Migraines    On home oxygen therapy    Pulmonary  hypertension (CMS/HHS-HCC)    Tachycardia    Past Surgical History:  Procedure Laterality Date   LITHOTRIPSY  2020    TOTAL LAPAROSCOPIC HYSTERECTOMY WITH SALPINGECTOMY Bilateral   05/12/2019   Procedure: TOTAL LAPAROSCOPIC HYSTERECTOMY;  Surgeon: Leonce Garnette BIRCH, MD;  Location: [facility] ORS;  Service: Gynecology;  Laterality: Bilateral;   CYSTOSCOPY  05/12/2019   COLONOSCOPY WITH PROPOFOL   08/11/2020   Surgeon: Therisa Bi, MD;  Location: [facility]   COLONOSCOPY WITH PROPOFOL   10/20/2020   Surgeon: Wilhelmenia Aloha Raddle., MD;   ENDOSCOPIC MUCOSAL RESECTION  10/20/2020   Surgeon: Wilhelmenia Aloha Raddle., MD;   HEMOSTASIS CLIP PLACEMENT  10/20/2020   Surgeon: Wilhelmenia Aloha Raddle., MD;  Location: [facility] ENDOSCOPY;  Service: Gastroenterology;;    POLYPECTOMY  10/20/2020   Surgeon: Wilhelmenia Aloha Raddle., MD;  Location: [facility] ENDOSCOPY;  Service: Gastroenterology;;   HOT HEMOSTASIS  10/20/2020   HOT HEMOSTASIS (ARGON PLASMA COAGULATION/BICAP);  Surgeon: Wilhelmenia Aloha Raddle., MD;  Location: [facility] ENDOSCOPY;  Service: Gastroenterology;  Laterality:   ESOPHAGOGASTRODUODENOSCOPY (EGD) WITH PROPOFOL   02/07/2021   Surgeon: Therisa Bi, MD;  Location: [facility] ENDOSCOPY;   OTHER SURGERY  10/09/2021   TUBAL LIGATION   PR RIGHT HEART CATH O2 SATURATION & CARDIAC OUTPUT  Right Heart Catheterization   06/23/2024  PAH (pulmonary artery hypertension) (CMS-HCC)  pulmonary hypertension   OTHER SURGERY     GASTRIC BYPASS   OB History  Gravida Para Term Preterm AB Living  5 3 3  2    SAB IAB Ectopic Molar Multiple Live Births  2         # Outcome Date GA Lbr Len/2nd Weight Sex Type Anes PTL Lv  5 SAB           4 SAB           3 Term           2 Term           1 Term           Patient denies any other pertinent gynecologic issues.   Current Outpatient Medications on File Prior to Visit  Medication Sig Dispense Refill   albuterol  MDI, PROVENTIL , VENTOLIN , PROAIR , HFA 90 mcg/actuation inhaler INHALE 2  INHALATIONS BY MOUTH  EVERY 6 HOURS AS NEEDED FOR  WHEEZING     ALPRAZolam  (XANAX ) 1 MG tablet Take 1 mg by mouth 2 (two) times daily as needed     ambrisentan  (LETAIRIS ) 10 MG tablet Take by mouth     ARNUITY ELLIPTA 100 mcg/actuation DsDv USE 1 INHALATION BY MOUTH DAILY     biotin-collagen-vit C-E-herbal 1,250 mcg-50 mg -67.5 mg-15 mg Chew Take by mouth     citalopram  (CELEXA ) 40 MG tablet Take by mouth     diltiazem  (CARDIZEM ) 120 MG tablet TAKE 1 TABLET BY MOUTH 3  TIMES DAILY     estradioL  (ESTRACE ) 2 MG tablet Take 1 tablet by mouth once daily     famotidine  (PEPCID ) 20 MG tablet Take by mouth     FUROsemide  (LASIX ) 20 MG tablet Take by mouth     HYDROcodone -acetaminophen  (NORCO) 5-325 mg tablet Take 1 tablet by mouth every 6 (six) hours as needed for Pain for up to 20 doses 20 tablet 0   potassium chloride  (KLOR-CON  M20) 20 MEQ ER tablet Take 1 tablet by mouth once daily     selexipag  (UPTRAVI ) 1,200 mcg tablet Take by mouth     spironolactone  (ALDACTONE ) 100 MG tablet Take 100 mg by mouth once daily     tadalafiL , pulm. hypertension, (ADCIRCA ) 20 mg tablet Take by mouth     tamsulosin  (FLOMAX ) 0.4 mg capsule      traZODone  (DESYREL ) 50 MG tablet Take 3 tablets (150 mg total) by mouth at bedtime as needed for Sleep 60 tablet 5   No current facility-administered medications on file prior to visit.   Allergies  Allergen Reactions   Cyclosporine Other (See Comments)    Medication interactions   Nitrate Analogues Other (See Comments)    Medication interactions  Medication interactions-Lung meds taken by pt.  No Nitrates please  Medication interactions  Medication interactions  Medication interactions-Lung meds taken by pt.  No Nitrates please  Can't take w/ other meds   Other Other (See Comments)    Muscle relaxers - per patient interacts with other medications she is on.  NO MUSCLE RELAXERS.   Pt w/ hx of pulmonary HTN, cannot have them.   Can't take w/ other  meds  Muscle relaxers - per patient interacts with other medications she is on.    Gastric bypass  NO MUSCLE RELAXERS. No nitrates.  Pt w/ hx of pulmonary HTN, cannot have them.   Muscle relaxers - per patient interacts with other medications she is on.  Ph Test Other (See Comments)    Reactions with other medications    Tizanidine Other (See Comments)    Interaction with heart and lung medications  Interaction with heart and lung medications  Interaction with heart and lung medications   Erythromycin Other (See Comments)   Nitroglycerin Other (See Comments)    Medication interactions   Nsaids (Non-Steroidal Anti-Inflammatory Drug) Other (See Comments)    Gastric bypass  Reason: Gastric bypass   Pollen Extracts Other (See Comments)   Adhesive Tape-Silicones Rash   Tapentadol Rash    Social History:   reports that she has never smoked. She has never used smokeless tobacco. She reports that she does not drink alcohol and does not use drugs.  Family History  Problem Relation Name Age of Onset   High blood pressure (Hypertension) Father     Diabetes Father     Lung disease Father     Heart disease Father     Kidney cancer Father     Colon cancer Sister     Ovarian cancer Sister     Colon cancer Paternal Grandmother     Ovarian cancer Paternal Aunt     Breast cancer Paternal Aunt     Stomach cancer Paternal Aunt     Brain cancer Paternal Uncle     Inflammatory bowel disease Neg Hx     Liver disease Neg Hx     Pancreatic cancer Neg Hx      Review of Systems: Noncontributory  PHYSICAL EXAM: There were no vitals taken for this visit. CONSTITUTIONAL: Well-developed, well-nourished female in no acute distress.  HENT:  Normocephalic, atraumatic, External right and left ear normal. Oropharynx is clear and moist EYES: Conjunctivae and EOM are normal. Pupils are equal, round, and reactive to light. No scleral icterus.  NECK: Normal range of motion, supple, no masses SKIN:  Skin is warm and dry. No rash noted. Not diaphoretic. No erythema. No pallor. NEUROLGIC: Alert and oriented to person, place, and time. Normal reflexes, muscle tone coordination. No cranial nerve deficit noted. PSYCHIATRIC: Normal mood and affect. Normal behavior. Normal judgment and thought content. CARDIOVASCULAR: Normal heart rate noted, regular rhythm RESPIRATORY: Effort and breath sounds normal, no problems with respiration noted ABDOMEN: Soft, nontender, nondistended. PELVIC: Deferred MUSCULOSKELETAL: Normal range of motion. No edema and no tenderness. 2+ distal pulses.  Labs: No results found for this or any previous visit (from the past 2 weeks).  Imaging Studies: US  [facility] pelvic transvaginal Result Date: 06/23/2024 Indication ======== Cyst of ovary. Results ====== TO VIEW THE FORMAL REPORT IN MAESTRO, OPEN THE PDF (click on Imaging result with paper clip icon, then click link under Scans on Order). The plain text version does not include embedded images or graphs. The image link is for reference only. ViewPoint is the official diagnostic image storage site. Endovaginal Imaging ================ Indication: Endovaginal imaging was necessary to evaluate the ovaries. Probe: [id number]. Uterus ====== Not examined Surgically Absent Right Ovary ========= Visualized, Enlarged. Outline: smooth. Morphology: appropriate. Size 55 mm x 25 mm x 27 mm. Corpus luteum: Collapsed. Size 21.0 mm x 14.0 mm x 22.0 mm Cyst(s)    1.  Complex Unilocular Cyst. Size 22.0 mm x 17.0 mm x 18 mm. Mean 19.0 mm. Vol 3.525 cm        2.  Complex Unilocular Cyst. Size 28.0 mm x 24.0 mm x 25 mm. Mean 25.7 mm. Vol 8.796 cm Left Ovary ======== Visualized, Normal. Outline: smooth. Morphology: appropriate. Size 35 mm x  11 mm x 12 mm No cysts identified Follicle(s)    Size 14.0 mm x 11.0 mm x 13.0 mm. Mean 53.0 mm. Vol 1.048 cm Cul de Sac ========= Normal. Anechoic. Free fluid visualized: mild Largest pool 44.0 mm x 20.0 mm x  38.0 mm. Vol 17.509 ml Impression ========= Endovaginal sonogram was performed due to the indications outlined above. The uterus is surgically absent. Vaginal cuff appears grossly normal. Right ovary contains 3 cysts detailed and measured above. Left ovary contains a simple follicle with measurements above. Mild amount of free fluid seen Right adnexa near Right ovary detailed above.   Assessment: Problem List Items Addressed This Visit   None Visit Diagnoses       Cyst of ovary, unspecified laterality    -  Primary     Right ovarian cyst         Pelvic pain in female           Plan: Patient will undergo surgical management with the above-noted surgery.   The risks of surgery were discussed in detail with the patient including but not limited to: bleeding which may require transfusion or reoperation; infection which may require antibiotics; injury to surrounding organs which may involve bowel, bladder, ureters ; need for additional procedures including laparoscopy or laparotomy; thromboembolic phenomenon, surgical site problems and other postoperative/anesthesia complications. Likelihood of success in alleviating the patient's condition was discussed. Routine postoperative instructions will be reviewed with the patient and her family in detail after surgery.  The patient concurred with the proposed plan, giving informed written consent for the surgery.   Preoperative prophylactic antibiotics, as indicated, and SCDs ordered on call to the OR.   Clearance obtained from Dr. Janie from [facility].   Patient to continue taking PAH medications per her pulmonologist.  Normally uses percocet (10mg ). Not supposed to use NSAID medication.    Return in about 5 weeks (around 08/25/2024) for Post-op incision check.    Attestation Statement:   I personally performed the service. (TP)  Jericha Bryden TORIBIO MACE, MD  [facility] OB/GYN [facility] 07/21/2024 3:51 PM

## 2024-07-31 ENCOUNTER — Other Ambulatory Visit: Payer: Self-pay

## 2024-07-31 ENCOUNTER — Encounter
Admission: RE | Admit: 2024-07-31 | Discharge: 2024-07-31 | Disposition: A | Discharge status: Home / Self Care | Source: Ambulatory Visit | Attending: Obstetrics and Gynecology | Admitting: Obstetrics and Gynecology

## 2024-07-31 ENCOUNTER — Encounter: Payer: Self-pay | Admitting: Urgent Care

## 2024-07-31 VITALS — Ht 69.0 in | Wt 164.0 lb

## 2024-07-31 DIAGNOSIS — I5033 Acute on chronic diastolic (congestive) heart failure: Secondary | ICD-10-CM

## 2024-07-31 DIAGNOSIS — Z01812 Encounter for preprocedural laboratory examination: Secondary | ICD-10-CM

## 2024-07-31 DIAGNOSIS — Z01810 Encounter for preprocedural cardiovascular examination: Secondary | ICD-10-CM

## 2024-07-31 DIAGNOSIS — I10 Essential (primary) hypertension: Secondary | ICD-10-CM

## 2024-07-31 DIAGNOSIS — D649 Anemia, unspecified: Secondary | ICD-10-CM

## 2024-07-31 DIAGNOSIS — I2721 Secondary pulmonary arterial hypertension: Secondary | ICD-10-CM

## 2024-07-31 DIAGNOSIS — N83201 Unspecified ovarian cyst, right side: Secondary | ICD-10-CM

## 2024-07-31 HISTORY — DX: Morbid (severe) obesity due to excess calories: E66.01

## 2024-07-31 HISTORY — DX: Cyst of kidney, acquired: N28.1

## 2024-07-31 HISTORY — DX: Unspecified condition associated with female genital organs and menstrual cycle: N94.9

## 2024-07-31 HISTORY — DX: Personal history of nicotine dependence: Z87.891

## 2024-07-31 HISTORY — DX: Leiomyoma of uterus, unspecified: D25.9

## 2024-07-31 HISTORY — DX: Unspecified ovarian cyst, unspecified side: N83.209

## 2024-07-31 HISTORY — DX: Iron deficiency anemia, unspecified: D50.9

## 2024-07-31 NOTE — Progress Notes (Addendum)
 Perioperative Services Pre-Admission/Anesthesia Testing    Date: 07/31/24  Name: Rachel Wall DOB: 01-04-1981 MRN:   [id number]  Re: Plans for surgery; needs higher level of care  Planned Surgical Procedure(s):     Case: [id number] Date/Time: 08/07/24 1019   Procedure: OOPHORECTOMY, ROBOT-ASSISTED (Bilateral: Pelvis)   Anesthesia type: Choice   Diagnosis: Right ovarian cyst [N83.201]   Pre-op diagnosis: Right ovarian cyst   Location: [facility] OR ROOM 06 / [facility] ORS FOR ANESTHESIA GROUP   Surgeons: Leonce Garnette BIRCH, MD        Clinical Notes:  Patient is scheduled for the above procedure on 08/07/2024 with Dr. Garnette BIRCH Leonce, MD.  During the course of her preoperative interview, it was noted that patient has a significant cardiovascular history.  PMH significant for HFpEF, pulmonary hypertension, symptomatic bradycardia requiring pacemaker placement, and supplemental oxygen use.  Patient formerly followed by both cardiology and pulmonary medicine, however she made mention of the fact that her cardiologist recently passed away.  She is now only seeing pulmonary medicine Luise, MD) at the [facility].  Recent cardiovascular testing reviewed.   Most recent TTE performed on 03/24/2024 revealed a normal left ventricular systolic function with a hyperdynamic LVEF of 70%. There was no significant LVH.  There were no regional wall motion abnormalities.  Diastolic Doppler parameters were normal.  There was moderate left atrial dilatation.  Right ventricular size and function normal with a TAPSE measuring 3.0 cm  (normal range >/= 1.6 cm).  RVSP = 36 mmHg.  There was mild mitral valve regurgitation.  All transvalvular gradients were noted to be normal providing no evidence of hemodynamically significant valvular stenosis. Aorta normal in size with no evidence of ectasia or aneurysmal dilatation.  Diagnostic RIGHT cardiac catheterization was performed on  06/27/2024.  Hemodynamics: mean RA = 5 mmHg, mean PA = 24 mmHg, mean PCWP = 13 mmHg, PA saturation = 70%, AO saturation = 97%, CO = 4.84 L/min, CI = 2.62 L/min/m, PVR 1.9 Wood units, SVR 970.  In order to treat her pulmonary hypertension, patient is on multiple interventions including: LATAIRIS, UPTRAVI , REVATIO. Given the degree and complexity of patient's past medical cardiovascular history, patient was reviewed with primary  attending anesthesiologist on-call (Mazzoni, MD).  Impression and Plan:  Rachel Wall is scheduled for elective oophorectomy on 08/07/2024 with Dr. Garnette Leonce, MD.  Given patient's advanced care needs in the setting of HFpEF and pulmonary hypertension, the consensus recommendation from the anesthesia team is that patient be transferred to a tertiary care setting for this case. In addition to her cardiac concerns, patient has had two recent admissions for hypervolemia in April and June, again increasing her risk for potential perioperative complications. In order for patient to receive safe and effective anesthetic care, patient may require treatment using selective pulmonary dilator therapy (inhaled nitric oxide) in order to improve oxygenation while the patient is under anesthesia. Additional care needs would/could potentially include intraoperative TEE and invasive hemodynamic monitoring.  Unfortunately, we do not have these capabilities available here at [facility].     Decision regarding case was not made in haste, rather it was made following comprehensive review of patient's past medical history in addition to hiss current and future (day of surgery) care needs. Holding patient's safety at the forefront, we feel that patient would be better served in a setting equipped with the personnel, treatment modalities, and equipment needed to ensure the provision of  safe anesthesia care for this patient's fistula creation.   Communicated with  OB/GYN team to make the office aware of discussions with anesthesia. Office to follow-up with patient regarding need to transfer care to a larger facility in order to have her oophorectomy. No further needs from the PAT department identified at this time.  Dorise Pereyra, MSN, APRN, FNP-C, CEN [facility]  Perioperative Services Nurse Practitioner Phone: [phone] Fax: [phone] 07/31/24 3:29 PM  NOTE: This note has been prepared using Dragon dictation software. Despite my best ability to proofread, there is always the potential that unintentional transcriptional errors may still occur from this process.

## 2024-07-31 NOTE — Patient Instructions (Addendum)
 Your procedure is scheduled on:08-07-24 Friday Report to the Registration Desk on the 1st floor of the [facility].Then proceed to the 2nd floor Surgery Desk To find out your arrival time, please call [phone] between 1PM - 3PM on:08-06-24 Thursday If your arrival time is 6:00 am, do not arrive before that time as the [facility] entrance doors do not open until 6:00 am.  REMEMBER: Instructions that are not followed completely may result in serious medical risk, up to and including death; or upon the discretion of your surgeon and anesthesiologist your surgery may need to be rescheduled.  Do not eat food after midnight the night before surgery.  No gum chewing or hard candies.  You may however, drink CLEAR liquids up to 2 hours before you are scheduled to arrive for your surgery. Do not drink anything within 2 hours of your scheduled arrival time.  Clear liquids include: - water  - apple juice without pulp - gatorade (not RED colors) - black coffee or tea (Do NOT add milk or creamers to the coffee or tea) Do NOT drink anything that is not on this list.  In addition, your doctor has ordered for you to drink the provided:  Ensure Pre-Surgery Clear Carbohydrate Drink  Drinking this carbohydrate drink up to two hours before surgery helps to reduce insulin resistance and improve patient outcomes. Please complete drinking 2 hours before scheduled arrival time.  One week prior to surgery:Stop NOW (07-31-24) Stop Anti-inflammatories (NSAIDS) such as Advil , Aleve , Ibuprofen , Motrin , Naproxen , Naprosyn  and Aspirin based products such as Excedrin, Goody's Powder, BC Powder. Stop ANY OVER THE COUNTER supplements until after surgery (Bariatric Vitamin, Vitamin E, Collagen)  You may however, continue to take Tylenol  if needed for pain up until the day of surgery.  Continue taking all of your other prescription medications up until the day of surgery.  ON THE DAY OF SURGERY ONLY TAKE THESE  MEDICATIONS WITH SIPS OF WATER: -ambrisentan  (LETAIRIS )  -ARIPiprazole (ABILIFY)  -citalopram  (CELEXA )  -clonazePAM (KLONOPIN)  -diltiazem  (CARDIZEM )  -hydrOXYzine (ATARAX)  -pantoprazole  (PROTONIX )  -potassium chloride  SA (KLOR-CON  M)  -Selexipag   -You may take ALPRAZolam  (XANAX ) if needed for anxiety  No Alcohol for 24 hours before or after surgery.  No Smoking including e-cigarettes for 24 hours before surgery.  No chewable tobacco products for at least 6 hours before surgery.  No nicotine  patches on the day of surgery.  Do not use any recreational drugs for at least a week (preferably 2 weeks) before your surgery.  Please be advised that the combination of cocaine and anesthesia may have negative outcomes, up to and including death. If you test positive for cocaine, your surgery will be cancelled.  On the morning of surgery brush your teeth with toothpaste and water, you may rinse your mouth with mouthwash if you wish. Do not swallow any toothpaste or mouthwash.  Use CHG Soap as directed on instruction sheet.  Do not wear jewelry, make-up, hairpins, clips or nail polish.  For welded (permanent) jewelry: bracelets, anklets, waist bands, etc.  Please have this removed prior to surgery.  If it is not removed, there is a chance that hospital personnel will need to cut it off on the day of surgery.  Do not wear lotions, powders, or perfumes.   Do not shave body hair from the neck down 48 hours before surgery.  Contact lenses, hearing aids and dentures may not be worn into surgery.  Do not bring valuables to the hospital. [facility] is not responsible for any missing/lost belongings or valuables.   Notify your doctor if there is any change in your medical condition (cold, fever, infection).  Wear comfortable clothing (specific to your surgery type) to the hospital.  After surgery, you can help prevent lung complications by doing breathing exercises.  Take deep breaths and  cough every 1-2 hours. Your doctor may order a device called an Incentive Spirometer to help you take deep breaths. When coughing or sneezing, hold a pillow firmly against your incision with both hands. This is called "splinting." Doing this helps protect your incision. It also decreases belly discomfort.  If you are being admitted to the hospital overnight, leave your suitcase in the car. After surgery it may be brought to your room.  In case of increased patient census, it may be necessary for you, the patient, to continue your postoperative care in the Same Day Surgery department.  If you are being discharged the day of surgery, you will not be allowed to drive home. You will need a responsible individual to drive you home and stay with you for 24 hours after surgery.   If you are taking public transportation, you will need to have a responsible individual with you.  Please call the Pre-admissions Testing Dept. at [phone] if you have any questions about these instructions.  Surgery Visitation Policy:  Patients having surgery or a procedure may have two visitors.  Children under the age of 89 must have an adult with them who is not the patient.                                                                                                             Preparing for Surgery with CHLORHEXIDINE  GLUCONATE (CHG) Soap  Chlorhexidine  Gluconate (CHG) Soap  o An antiseptic cleaner that kills germs and bonds with the skin to continue killing germs even after washing  o Used for showering the night before surgery and morning of surgery  Before surgery, you can play an important role by reducing the number of germs on your skin.  CHG (Chlorhexidine  gluconate) soap is an antiseptic cleanser which kills germs and bonds with the skin to continue killing germs even after washing.  Please do not use if you have an allergy to CHG or antibacterial soaps. If your skin becomes reddened/irritated  stop using the CHG.  1. Shower the NIGHT BEFORE SURGERY with CHG soap.  2. If you choose to wash your hair, wash your hair first as usual with your normal shampoo.  3. After shampooing, rinse your hair and body thoroughly to remove the shampoo.  4. Use CHG as you would any other liquid soap. You can apply CHG directly to the skin and wash gently with a clean washcloth.  5. Apply the CHG soap to your body only from the neck down. Do not use on open wounds or open sores. Avoid contact with your eyes, ears, mouth, and genitals (private parts). Wash face and genitals (private parts) with your normal soap.  6.  Wash thoroughly, paying special attention to the area where your surgery will be performed.  7. Thoroughly rinse your body with warm water.  8. Do not shower/wash with your normal soap after using and rinsing off the CHG soap.  9. Do not use lotions, oils, etc., after showering with CHG.  10. Pat yourself dry with a clean towel.  11. Wear clean pajamas to bed the night before surgery.  12. Place clean sheets on your bed the night of your shower and do not sleep with pets.  13. Do not apply any deodorants/lotions/powders.  14. Please wear clean clothes to the hospital.  15. Remember to brush your teeth with your regular toothpaste.   [employer] to address health-related social needs:  [email or web address]

## 2024-08-04 ENCOUNTER — Inpatient Hospital Stay: Admission: RE | Admit: 2024-08-04 | Source: Ambulatory Visit

## 2024-08-07 ENCOUNTER — Ambulatory Visit: Admit: 2024-08-07 | Admitting: Obstetrics and Gynecology

## 2024-08-07 SURGERY — OOPHORECTOMY, ROBOT-ASSISTED
Anesthesia: Choice | Site: Pelvis | Laterality: Bilateral

## 2024-08-09 ENCOUNTER — Other Ambulatory Visit: Payer: Self-pay | Admitting: Obstetrics & Gynecology

## 2024-08-09 DIAGNOSIS — Z780 Asymptomatic menopausal state: Secondary | ICD-10-CM

## 2024-08-11 ENCOUNTER — Other Ambulatory Visit: Payer: Self-pay | Admitting: Obstetrics & Gynecology

## 2024-08-11 DIAGNOSIS — Z780 Asymptomatic menopausal state: Secondary | ICD-10-CM

## 2024-08-13 ENCOUNTER — Telehealth: Payer: Self-pay

## 2024-08-13 NOTE — Telephone Encounter (Signed)
 [facility] pharmacy left voice msg on triage regarding patients Rx estradiol  (ESTRACE ) 2 MG tablet . Called pharmacy back and advised patient is being seen at [facility] and they should have provider there manage Rx.
